# Patient Record
Sex: Male | Born: 1980 | Race: Black or African American | Hispanic: No | Marital: Married | Smoking: Former smoker
Health system: Southern US, Community
[De-identification: ages and names within clinical notes are randomized; demographics above are authoritative.]

## PROBLEM LIST (undated history)

## (undated) DIAGNOSIS — B1910 Unspecified viral hepatitis B without hepatic coma: Secondary | ICD-10-CM

## (undated) DIAGNOSIS — K759 Inflammatory liver disease, unspecified: Secondary | ICD-10-CM

## (undated) DIAGNOSIS — C8590 Non-Hodgkin lymphoma, unspecified, unspecified site: Secondary | ICD-10-CM

## (undated) DIAGNOSIS — R7989 Other specified abnormal findings of blood chemistry: Secondary | ICD-10-CM

## (undated) DIAGNOSIS — K7689 Other specified diseases of liver: Secondary | ICD-10-CM

## (undated) DIAGNOSIS — R945 Abnormal results of liver function studies: Secondary | ICD-10-CM

## (undated) HISTORY — DX: Inflammatory liver disease, unspecified: K75.9

## (undated) HISTORY — DX: Unspecified viral hepatitis B without hepatic coma: B19.10

## (undated) HISTORY — PX: PORT-A-CATH REMOVAL: SHX5289

## (undated) HISTORY — DX: Other specified diseases of liver: K76.89

## (undated) HISTORY — DX: Other specified abnormal findings of blood chemistry: R79.89

## (undated) HISTORY — PX: PORTACATH PLACEMENT: SHX2246

## (undated) HISTORY — DX: Abnormal results of liver function studies: R94.5

---
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
## 1999-08-09 ENCOUNTER — Emergency Department (HOSPITAL_COMMUNITY): Admission: EM | Admit: 1999-08-09 | Discharge: 1999-08-09 | Payer: Self-pay | Admitting: Emergency Medicine

## 2000-05-05 ENCOUNTER — Encounter: Payer: Self-pay | Admitting: Emergency Medicine

## 2000-05-05 ENCOUNTER — Emergency Department (HOSPITAL_COMMUNITY): Admission: EM | Admit: 2000-05-05 | Discharge: 2000-05-05 | Payer: Self-pay | Admitting: Emergency Medicine

## 2002-05-01 ENCOUNTER — Ambulatory Visit (HOSPITAL_COMMUNITY): Admission: RE | Admit: 2002-05-01 | Discharge: 2002-05-01 | Payer: Self-pay | Admitting: *Deleted

## 2002-05-01 ENCOUNTER — Encounter: Payer: Self-pay | Admitting: *Deleted

## 2006-05-07 ENCOUNTER — Emergency Department (HOSPITAL_COMMUNITY): Admission: EM | Admit: 2006-05-07 | Discharge: 2006-05-07 | Payer: Self-pay | Admitting: Emergency Medicine

## 2006-12-17 ENCOUNTER — Emergency Department (HOSPITAL_COMMUNITY): Admission: EM | Admit: 2006-12-17 | Discharge: 2006-12-17 | Payer: Self-pay | Admitting: Emergency Medicine

## 2007-12-13 ENCOUNTER — Emergency Department (HOSPITAL_COMMUNITY): Admission: EM | Admit: 2007-12-13 | Discharge: 2007-12-13 | Payer: Self-pay | Admitting: *Deleted

## 2009-03-12 ENCOUNTER — Emergency Department (HOSPITAL_COMMUNITY): Admission: EM | Admit: 2009-03-12 | Discharge: 2009-03-12 | Payer: Self-pay | Admitting: Family Medicine

## 2009-08-04 ENCOUNTER — Ambulatory Visit (HOSPITAL_COMMUNITY): Admission: RE | Admit: 2009-08-04 | Discharge: 2009-08-04 | Payer: Self-pay | Admitting: Cardiology

## 2009-08-04 ENCOUNTER — Encounter (INDEPENDENT_AMBULATORY_CARE_PROVIDER_SITE_OTHER): Payer: Self-pay | Admitting: Cardiology

## 2010-01-21 ENCOUNTER — Emergency Department (HOSPITAL_COMMUNITY): Admission: EM | Admit: 2010-01-21 | Discharge: 2010-01-21 | Payer: Self-pay | Admitting: Emergency Medicine

## 2010-08-25 LAB — POCT URINALYSIS DIP (DEVICE)
Bilirubin Urine: NEGATIVE
Glucose, UA: NEGATIVE mg/dL
Hgb urine dipstick: NEGATIVE
Nitrite: NEGATIVE
Protein, ur: NEGATIVE mg/dL
Specific Gravity, Urine: 1.02 (ref 1.005–1.030)
Urobilinogen, UA: 1 mg/dL (ref 0.0–1.0)
pH: 7 (ref 5.0–8.0)

## 2015-12-09 ENCOUNTER — Observation Stay (HOSPITAL_COMMUNITY)
Admission: EM | Admit: 2015-12-09 | Discharge: 2015-12-12 | Disposition: A | Discharge status: Home / Self Care | Payer: Managed Care, Other (non HMO) | Attending: Internal Medicine | Admitting: Internal Medicine

## 2015-12-09 ENCOUNTER — Encounter (HOSPITAL_COMMUNITY): Payer: Self-pay | Admitting: Emergency Medicine

## 2015-12-09 ENCOUNTER — Emergency Department (HOSPITAL_COMMUNITY): Payer: Managed Care, Other (non HMO)

## 2015-12-09 DIAGNOSIS — R1013 Epigastric pain: Secondary | ICD-10-CM | POA: Diagnosis not present

## 2015-12-09 DIAGNOSIS — R945 Abnormal results of liver function studies: Secondary | ICD-10-CM

## 2015-12-09 DIAGNOSIS — E878 Other disorders of electrolyte and fluid balance, not elsewhere classified: Secondary | ICD-10-CM | POA: Insufficient documentation

## 2015-12-09 DIAGNOSIS — J039 Acute tonsillitis, unspecified: Secondary | ICD-10-CM | POA: Insufficient documentation

## 2015-12-09 DIAGNOSIS — R509 Fever, unspecified: Secondary | ICD-10-CM | POA: Diagnosis present

## 2015-12-09 DIAGNOSIS — R1010 Upper abdominal pain, unspecified: Secondary | ICD-10-CM | POA: Diagnosis not present

## 2015-12-09 DIAGNOSIS — E871 Hypo-osmolality and hyponatremia: Secondary | ICD-10-CM | POA: Diagnosis not present

## 2015-12-09 DIAGNOSIS — C8590 Non-Hodgkin lymphoma, unspecified, unspecified site: Secondary | ICD-10-CM | POA: Insufficient documentation

## 2015-12-09 DIAGNOSIS — R7989 Other specified abnormal findings of blood chemistry: Secondary | ICD-10-CM | POA: Diagnosis present

## 2015-12-09 DIAGNOSIS — R109 Unspecified abdominal pain: Secondary | ICD-10-CM | POA: Diagnosis present

## 2015-12-09 HISTORY — DX: Non-Hodgkin lymphoma, unspecified, unspecified site: C85.90

## 2015-12-09 LAB — URINALYSIS, ROUTINE W REFLEX MICROSCOPIC
GLUCOSE, UA: NEGATIVE mg/dL
Hgb urine dipstick: NEGATIVE
LEUKOCYTES UA: NEGATIVE
NITRITE: NEGATIVE
PH: 7 (ref 5.0–8.0)
Protein, ur: NEGATIVE mg/dL
Specific Gravity, Urine: 1.022 (ref 1.005–1.030)

## 2015-12-09 LAB — COMPREHENSIVE METABOLIC PANEL
ALT: 718 U/L — ABNORMAL HIGH (ref 17–63)
ANION GAP: 8 (ref 5–15)
AST: 503 U/L — AB (ref 15–41)
Albumin: 3.8 g/dL (ref 3.5–5.0)
Alkaline Phosphatase: 82 U/L (ref 38–126)
BILIRUBIN TOTAL: 1.7 mg/dL — AB (ref 0.3–1.2)
BUN: 7 mg/dL (ref 6–20)
CHLORIDE: 98 mmol/L — AB (ref 101–111)
CO2: 26 mmol/L (ref 22–32)
Calcium: 9.1 mg/dL (ref 8.9–10.3)
Creatinine, Ser: 1.01 mg/dL (ref 0.61–1.24)
Glucose, Bld: 95 mg/dL (ref 65–99)
POTASSIUM: 4.3 mmol/L (ref 3.5–5.1)
Sodium: 132 mmol/L — ABNORMAL LOW (ref 135–145)
TOTAL PROTEIN: 8.2 g/dL — AB (ref 6.5–8.1)

## 2015-12-09 LAB — CBC
HCT: 42.1 % (ref 39.0–52.0)
HEMOGLOBIN: 14.6 g/dL (ref 13.0–17.0)
MCH: 28.7 pg (ref 26.0–34.0)
MCHC: 34.7 g/dL (ref 30.0–36.0)
MCV: 82.7 fL (ref 78.0–100.0)
Platelets: 170 10*3/uL (ref 150–400)
RBC: 5.09 MIL/uL (ref 4.22–5.81)
RDW: 13.5 % (ref 11.5–15.5)
WBC: 8.9 10*3/uL (ref 4.0–10.5)

## 2015-12-09 LAB — LIPASE, BLOOD: LIPASE: 23 U/L (ref 11–51)

## 2015-12-09 LAB — MONONUCLEOSIS SCREEN: MONO SCREEN: NEGATIVE

## 2015-12-09 LAB — RAPID STREP SCREEN (MED CTR MEBANE ONLY): Streptococcus, Group A Screen (Direct): NEGATIVE

## 2015-12-09 MED ORDER — IBUPROFEN 200 MG PO TABS
400.0000 mg | ORAL_TABLET | Freq: Once | ORAL | Status: AC
  Administered 2015-12-09: 400 mg via ORAL
  Filled 2015-12-09: qty 2

## 2015-12-09 MED ORDER — SODIUM CHLORIDE 0.9% FLUSH: 3.0000 mL | INTRAVENOUS | Status: DC | PRN

## 2015-12-09 MED ORDER — SODIUM CHLORIDE 0.9% FLUSH
3.0000 mL | Freq: Two times a day (BID) | INTRAVENOUS | Status: DC
  Administered 2015-12-11: 3 mL via INTRAVENOUS

## 2015-12-09 MED ORDER — ACETAMINOPHEN 325 MG PO TABS
650.0000 mg | ORAL_TABLET | Freq: Four times a day (QID) | ORAL | Status: DC | PRN
Start: 2015-12-09 — End: 2015-12-12
  Filled 2015-12-09: qty 2

## 2015-12-09 MED ORDER — ACETAMINOPHEN 650 MG RE SUPP
650.0000 mg | Freq: Four times a day (QID) | RECTAL | Status: DC | PRN
Start: 2015-12-09 — End: 2015-12-12

## 2015-12-09 MED ORDER — SODIUM CHLORIDE 0.9 % IV SOLN: 250.0000 mL | INTRAVENOUS | Status: DC | PRN

## 2015-12-09 MED ORDER — ONDANSETRON HCL 4 MG PO TABS
4.0000 mg | ORAL_TABLET | Freq: Four times a day (QID) | ORAL | Status: DC | PRN
Start: 2015-12-09 — End: 2015-12-12

## 2015-12-09 MED ORDER — FAMOTIDINE IN NACL 20-0.9 MG/50ML-% IV SOLN
20.0000 mg | Freq: Two times a day (BID) | INTRAVENOUS | Status: DC
  Administered 2015-12-09 – 2015-12-11 (×5): 20 mg via INTRAVENOUS
  Filled 2015-12-09 (×5): qty 50

## 2015-12-09 MED ORDER — POLYETHYLENE GLYCOL 3350 17 G PO PACK: 17.0000 g | PACK | Freq: Every day | ORAL | Status: DC | PRN

## 2015-12-09 MED ORDER — SODIUM CHLORIDE 0.9 % IV BOLUS (SEPSIS)
1000.0000 mL | Freq: Once | INTRAVENOUS | Status: AC
  Administered 2015-12-09: 1000 mL via INTRAVENOUS

## 2015-12-09 MED ORDER — IOPAMIDOL (ISOVUE-300) INJECTION 61%
INTRAVENOUS | Status: AC
  Administered 2015-12-09: 100 mL
  Filled 2015-12-09: qty 100

## 2015-12-09 MED ORDER — ENOXAPARIN SODIUM 40 MG/0.4ML ~~LOC~~ SOLN
40.0000 mg | SUBCUTANEOUS | Status: DC
  Administered 2015-12-10 – 2015-12-12 (×3): 40 mg via SUBCUTANEOUS
  Filled 2015-12-09 (×3): qty 0.4

## 2015-12-09 MED ORDER — BISACODYL 5 MG PO TBEC: 5.0000 mg | DELAYED_RELEASE_TABLET | Freq: Every day | ORAL | Status: DC | PRN

## 2015-12-09 MED ORDER — HYDROCODONE-ACETAMINOPHEN 5-325 MG PO TABS
1.0000 | ORAL_TABLET | ORAL | Status: DC | PRN
  Administered 2015-12-10 – 2015-12-11 (×2): 2 via ORAL
  Filled 2015-12-09 (×3): qty 2

## 2015-12-09 MED ORDER — ONDANSETRON HCL 4 MG/2ML IJ SOLN: 4.0000 mg | Freq: Four times a day (QID) | INTRAMUSCULAR | Status: DC | PRN

## 2015-12-09 MED ORDER — HYDROMORPHONE HCL 1 MG/ML IJ SOLN: 1.0000 mg | INTRAMUSCULAR | Status: DC | PRN

## 2015-12-09 MED ORDER — SODIUM CHLORIDE 0.9 % IV SOLN
INTRAVENOUS | Status: AC
  Administered 2015-12-10: 05:00:00 via INTRAVENOUS

## 2015-12-09 MED ORDER — ONDANSETRON HCL 4 MG/2ML IJ SOLN
4.0000 mg | Freq: Once | INTRAMUSCULAR | Status: AC
Start: 2015-12-09 — End: 2015-12-09
  Administered 2015-12-09: 4 mg via INTRAVENOUS
  Filled 2015-12-09: qty 2

## 2015-12-09 MED ORDER — MORPHINE SULFATE (PF) 4 MG/ML IV SOLN
4.0000 mg | Freq: Once | INTRAVENOUS | Status: AC
  Administered 2015-12-09: 4 mg via INTRAVENOUS
  Filled 2015-12-09: qty 1

## 2015-12-09 NOTE — ED Notes (Signed)
Attempted report x1. 

## 2015-12-09 NOTE — H&P (Signed)
History and Physical    [name] [id number] DOB: 1980/09/30 DOA: 12/09/2015  PCP: No PCP Per Patient   Patient coming from: Home   Chief Complaint: Epigastric pain, fever  HPI: Maurice Lowe is a 35 y.o. 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 Patient reports being in his usual state of health until several days ago when he noted the development of a sore throat and subjective fevers. Symptoms persisted and yesterday were accompanied by severe epigastric abdominal pain. Patient has never experienced similar symptoms previously, takes no daily medications, denies sick contacts or long distance travel, and denies any significant alcohol use or use of illicit substances. Patient describes his pain as severe, nonradiating, localized to the epigastrium, exacerbated by palpation, and with no alleviating factors identified. Patient denies any family history of hepatitis, denies IV drug, or high-risk sexual behaviors. His history is notable for a remote blood transfusion sometime around 1990. Patient denies any chest pain, palpitations, cough, or dyspnea.  ED Course: Upon arrival to the ED, patient is found to be febrile to 39.2 C, saturating well on room air, and with vitals otherwise stable. CMP features a mild hyponatremia and hypochloremia, elevation in AST and ALT to 503 and 718, respectively, and total bilirubin of 1.7. CBC is unremarkable and lipase is within normal limits at 23. Rapid mono and strep testing was performed in the emergency department and negative. Ultrasound of the abdomen was performed and unremarkable. Contrast-enhanced CT of the abdomen and pelvis was also obtained in the ED and there are no acute or significant findings identified. Patient was given a bolus of 1 L normal saline, and treated symptomatically with  Advil, morphine, Pepcid, and Zofran. He remained hemodynamically stable in the ED and will be observed on the medical-surgical unit for ongoing evaluation and management of fever with abdominal pain and significant elevation in LFTs.  Review of Systems:  All other systems reviewed and apart from HPI, are negative.  Past Medical History  Diagnosis Date  . Non Hodgkin's lymphoma ([place])     History reviewed. No pertinent past surgical history.   reports that he has been smoking.  He does not have any smokeless tobacco history on file. He reports that he drinks about 1.8 oz of alcohol per week. His drug history is not on file.  Allergies  Allergen Reactions  . Phenergan [Promethazine] Other (See Comments)    Hyperactivity and agitation    Family History  Problem Relation Age of Onset  . Hepatitis Neg Hx      Prior to Admission medications   Not on File    Physical Exam: Filed Vitals:   12/09/15 2156 12/09/15 2215 12/09/15 2230 12/09/15 2310  BP: 109/71 102/64 112/72 110/61  Pulse: 85 67 71 55  Temp:    98.4 F (36.9 C)  TempSrc:    Oral  Resp: 20 18 18 18   Height:    5\' 6"  (1.676 m)  Weight:    56.246 kg (124 lb)  SpO2: 99% 100% 100% 96%      Constitutional: NAD, calm, comfortable Eyes: PERTLA, lids and conjunctivae normal, slightly icteric sclerae ENMT: Mucous membranes are moist. Posterior pharynx clear of any exudate or lesions.   Neck: normal, supple, no masses, no thyromegaly Respiratory: clear to auscultation bilaterally, no wheezing, no crackles. Normal respiratory effort.   Cardiovascular: S1 &  S2 heard, regular rate and rhythm, no significant murmurs. No extremity edema. 2+ pedal pulses. No carotid bruits. No significant JVD. Abdomen: No distension, tender in epigastrium only, no rebound pain or guarding, no masses palpated. Bowel sounds normal.  Musculoskeletal: no clubbing / cyanosis. No joint deformity upper and lower extremities. Normal muscle tone.    Skin: no significant rashes, lesions, ulcers. Warm, dry, well-perfused. Neurologic: CN 2-12 grossly intact. Sensation intact, DTR normal. Strength 5/5 in all 4 limbs.  Psychiatric: Normal judgment and insight. Alert and oriented x 3. Normal mood and affect.     Labs on Admission: I have personally reviewed following labs and imaging studies  CBC:  Recent Labs Lab 12/09/15 1350  WBC 8.9  HGB 14.6  HCT 42.1  MCV 82.7  PLT 123XX123   Basic Metabolic Panel:  Recent Labs Lab 12/09/15 1350  NA 132*  K 4.3  CL 98*  CO2 26  GLUCOSE 95  BUN 7  CREATININE 1.01  CALCIUM 9.1   GFR: Estimated Creatinine Clearance: 80.4 mL/min (by C-G formula based on Cr of 1.01). Liver Function Tests:  Recent Labs Lab 12/09/15 1350  AST 503*  ALT 718*  ALKPHOS 82  BILITOT 1.7*  PROT 8.2*  ALBUMIN 3.8    Recent Labs Lab 12/09/15 1350  LIPASE 23   No results for input(s): AMMONIA in the last 168 hours. Coagulation Profile: No results for input(s): INR, PROTIME in the last 168 hours. Cardiac Enzymes: No results for input(s): CKTOTAL, CKMB, CKMBINDEX, TROPONINI in the last 168 hours. BNP (last 3 results) No results for input(s): PROBNP in the last 8760 hours. HbA1C: No results for input(s): HGBA1C in the last 72 hours. CBG: No results for input(s): GLUCAP in the last 168 hours. Lipid Profile: No results for input(s): CHOL, HDL, LDLCALC, TRIG, CHOLHDL, LDLDIRECT in the last 72 hours. Thyroid Function Tests: No results for input(s): TSH, T4TOTAL, FREET4, T3FREE, THYROIDAB in the last 72 hours. Anemia Panel: No results for input(s): VITAMINB12, FOLATE, FERRITIN, TIBC, IRON, RETICCTPCT in the last 72 hours. Urine analysis:    Component Value Date/Time   COLORURINE AMBER* 12/09/2015 [place] 12/09/2015 1629   LABSPEC 1.022 12/09/2015 1629   PHURINE 7.0 12/09/2015 1629   GLUCOSEU NEGATIVE 12/09/2015 1629   HGBUR NEGATIVE 12/09/2015 1629   BILIRUBINUR SMALL*  12/09/2015 1629   KETONESUR >80* 12/09/2015 1629   PROTEINUR NEGATIVE 12/09/2015 1629   UROBILINOGEN 1.0 03/12/2009 1742   NITRITE NEGATIVE 12/09/2015 1629   LEUKOCYTESUR NEGATIVE 12/09/2015 1629   Sepsis Labs: @LABRCNTIP (procalcitonin:4,lacticidven:4) ) Recent Results (from the past 240 hour(s))  Rapid strep screen     Status: None   Collection Time: 12/09/15  1:51 PM  Result Value Ref Range Status   Streptococcus, Group A Screen (Direct) NEGATIVE NEGATIVE Final    Comment: (NOTE) A Rapid Antigen test may result negative if the antigen level in the sample is below the detection level of this test. The FDA has not cleared this test as a stand-alone test therefore the rapid antigen negative result has reflexed to a Group A Strep culture.      Radiological Exams on Admission: US Abdomen Complete  12/09/2015  CLINICAL DATA:  Elevated LFTs.  Abdominal pain. EXAM: ABDOMEN ULTRASOUND COMPLETE COMPARISON:  None. FINDINGS: Gallbladder: No gallstones or wall thickening visualized. No sonographic Murphy sign noted by sonographer. Common bile duct: Diameter: 2 mm Liver: The left hepatic lobe is mildly heterogeneous. No focal masses or abnormalities seen in the liver.  IVC: No abnormality visualized. Pancreas: Visualized portion unremarkable. Spleen: Size and appearance within normal limits. Right Kidney: Length: 11.1 cm. Echogenicity within normal limits. No mass or hydronephrosis visualized. Left Kidney: Length: 11 cm. Echogenicity within normal limits. No mass or hydronephrosis visualized. Abdominal aorta: No aneurysm visualized. Other findings: None. IMPRESSION: 1. No cause for the patient's symptoms identified. No acute abnormalities. Electronically Signed   By: Dorise Bullion III M.D   On: 12/09/2015 18:46   Ct Abdomen Pelvis W Contrast  12/09/2015  CLINICAL DATA:  Abdominal pain. Non-Hodgkin's lymphoma and hepatitis-B. EXAM: CT ABDOMEN AND PELVIS WITH CONTRAST TECHNIQUE: Multidetector CT  imaging of the abdomen and pelvis was performed using the standard protocol following bolus administration of intravenous contrast. CONTRAST:  145mL ISOVUE-300 IOPAMIDOL (ISOVUE-300) INJECTION 61% COMPARISON:  None. FINDINGS: Lower chest:  No acute findings. Hepatobiliary: Several hepatic cysts noted, however no liver masses are identified. Gallbladder is unremarkable. Pancreas: No mass, inflammatory changes, or other significant abnormality. Spleen: Within normal limits in size and appearance. Adrenals/Urinary Tract: No masses identified. No evidence of hydronephrosis. Stomach/Bowel: No evidence of obstruction, inflammatory process, or abnormal fluid collections. Normal appendix visualized. Vascular/Lymphatic: No pathologically enlarged lymph nodes. No evidence of abdominal aortic aneurysm. Reproductive: No mass or other significant abnormality. Other: None. Musculoskeletal:  No suspicious bone lesions identified. IMPRESSION: No acute findings or other significant abnormality identified. Electronically Signed   By: Earle Gell M.D.   On: 12/09/2015 20:46    EKG: Not performed, will obtain as appropriate.   Assessment/Plan  1. Abdominal pain, fever, elevated transaminases  - ASt 503; ALT 718; T bili 1.7; temp 39.2 C - Suspect these are related, suggesting an infectious etiology, though no leukocytosis present and no foci identified on advanced imaging  - Denies significant alcohol and denies use of any medications, including OTC's; denies IVDA or contacts with known hepatitis - Notably, he did have a blood transfusion prior to 1992 during his treatment for NHL  - Viral hepatitis panel is ordered and pending; if negative, may need to consider autoimmune etiologies, etc.  - Supportive care with gentle IVF hydration, antiemetics, analgesia, antipyretics prn   - Repeat CMP in am   2. Hyponatremia, hypochloremia  - Serum sodium 132, chloride 98 - Suspected secondary to dehydration  - Anticipate  resolution with IVF hydration  - Repeat CMP ordered for am     DVT prophylaxis: sq Lovenox  Code Status: Full  Family Communication: Discussed with patient  Disposition Plan: Observe on med-surg  Consults called: None  Admission status: Observation    Vianne Bulls, MD [facility] Pager [phone]  If 7PM-7AM, please contact night-coverage [email or web address] Password [email or web address]  12/10/2015, 12:02 AM

## 2015-12-09 NOTE — ED Notes (Signed)
abd pain since yesterday sore throat and fever for a few days

## 2015-12-09 NOTE — ED Notes (Signed)
Pt to ultrasound at this time.

## 2015-12-09 NOTE — ED Provider Notes (Signed)
CSN: [id number]     Arrival date & time 12/09/15  1325 History   First MD Initiated Contact with Patient 12/09/15 1619     Chief Complaint  Patient presents with  . Abdominal Pain     (Consider location/radiation/quality/duration/timing/severity/associated sxs/prior Treatment) HPI Comments: Patient with a prior history of non-Hodgkin's lymphoma presents with fever abdominal pain and sore throat. His non-Hodgkin's lymphoma was treated from ages [age]. He hasn't had any problems since that time. He states for about the last 5 days he's had low-grade fevers and sore throat. He denies any nasal congestion or other URI symptoms. No cough or chest congestion. Yesterday started having some stabbing pains in his epigastrium. He states it comes and goes but is fairly frequent. He's had some nausea but no vomiting. No diarrhea. No rashes. No urinary symptoms. He was seen in urgent care and sent here for further evaluation.  Patient is a 35 y.o. male presenting with abdominal pain.  Abdominal Pain Associated symptoms: fatigue, fever, nausea and sore throat   Associated symptoms: no chest pain, no chills, no cough, no diarrhea, no hematuria, no shortness of breath and no vomiting     Past Medical History  Diagnosis Date  . Non Hodgkin's lymphoma ([place])    History reviewed. No pertinent past surgical history. No family history on file. Social History  Substance Use Topics  . Smoking status: Current Some Day Smoker  . Smokeless tobacco: None  . Alcohol Use: None    Review of Systems  Constitutional: Positive for fever and fatigue. Negative for chills and diaphoresis.  HENT: Positive for sore throat. Negative for congestion, rhinorrhea and sneezing.   Eyes: Negative.   Respiratory: Negative for cough, chest tightness and shortness of breath.   Cardiovascular: Negative for chest pain and leg swelling.  Gastrointestinal: Positive for nausea and abdominal pain. Negative for vomiting, diarrhea and  blood in stool.  Genitourinary: Negative for frequency, hematuria, flank pain and difficulty urinating.  Musculoskeletal: Negative for back pain and arthralgias.  Skin: Negative for rash.  Neurological: Negative for dizziness, speech difficulty, weakness, numbness and headaches.      Allergies  Phenergan  Home Medications   Prior to Admission medications   Not on File   BP 109/71 mmHg  Pulse 85  Temp(Src) 99.4 F (37.4 C) (Oral)  Resp 20  SpO2 99% Physical Exam  Constitutional: He is oriented to person, place, and time. He appears well-developed and well-nourished.  HENT:  Head: Normocephalic and atraumatic.  Eyes: Pupils are equal, round, and reactive to light.  Neck: Normal range of motion. Neck supple.  Cardiovascular: Normal rate, regular rhythm and normal heart sounds.   Pulmonary/Chest: Effort normal and breath sounds normal. No respiratory distress. He has no wheezes. He has no rales. He exhibits no tenderness.  Abdominal: Soft. Bowel sounds are normal. There is tenderness (Tenderness to the epigastrium). There is no rebound and no guarding.  Musculoskeletal: Normal range of motion. He exhibits no edema.  Lymphadenopathy:    He has no cervical adenopathy.  Neurological: He is alert and oriented to person, place, and time.  Skin: Skin is warm and dry. No rash noted.  Psychiatric: He has a normal mood and affect.    ED Course  Procedures (including critical care time) Labs Review Results for orders placed or performed during the hospital encounter of 12/09/15  Rapid strep screen  Result Value Ref Range   Streptococcus, Group A Screen (Direct) NEGATIVE NEGATIVE  Lipase, blood  Result  Value Ref Range   Lipase 23 11 - 51 U/L  Comprehensive metabolic panel  Result Value Ref Range   Sodium 132 (L) 135 - 145 mmol/L   Potassium 4.3 3.5 - 5.1 mmol/L   Chloride 98 (L) 101 - 111 mmol/L   CO2 26 22 - 32 mmol/L   Glucose, Bld 95 65 - 99 mg/dL   BUN 7 6 - 20 mg/dL    Creatinine, Ser 1.01 0.61 - 1.24 mg/dL   Calcium 9.1 8.9 - 10.3 mg/dL   Total Protein 8.2 (H) 6.5 - 8.1 g/dL   Albumin 3.8 3.5 - 5.0 g/dL   AST 503 (H) 15 - 41 U/L   ALT 718 (H) 17 - 63 U/L   Alkaline Phosphatase 82 38 - 126 U/L   Total Bilirubin 1.7 (H) 0.3 - 1.2 mg/dL   GFR calc non Af Amer >60 >60 mL/min   GFR calc Af Amer >60 >60 mL/min   Anion gap 8 5 - 15  CBC  Result Value Ref Range   WBC 8.9 4.0 - 10.5 K/uL   RBC 5.09 4.22 - 5.81 MIL/uL   Hemoglobin 14.6 13.0 - 17.0 g/dL   HCT 42.1 39.0 - 52.0 %   MCV 82.7 78.0 - 100.0 fL   MCH 28.7 26.0 - 34.0 pg   MCHC 34.7 30.0 - 36.0 g/dL   RDW 13.5 11.5 - 15.5 %   Platelets 170 150 - 400 K/uL  Urinalysis, Routine w reflex microscopic  Result Value Ref Range   Color, Urine AMBER (A) YELLOW   APPearance CLEAR CLEAR   Specific Gravity, Urine 1.022 1.005 - 1.030   pH 7.0 5.0 - 8.0   Glucose, UA NEGATIVE NEGATIVE mg/dL   Hgb urine dipstick NEGATIVE NEGATIVE   Bilirubin Urine SMALL (A) NEGATIVE   Ketones, ur >80 (A) NEGATIVE mg/dL   Protein, ur NEGATIVE NEGATIVE mg/dL   Nitrite NEGATIVE NEGATIVE   Leukocytes, UA NEGATIVE NEGATIVE  Mononucleosis screen  Result Value Ref Range   Mono Screen NEGATIVE NEGATIVE   US Abdomen Complete  12/09/2015  CLINICAL DATA:  Elevated LFTs.  Abdominal pain. EXAM: ABDOMEN ULTRASOUND COMPLETE COMPARISON:  None. FINDINGS: Gallbladder: No gallstones or wall thickening visualized. No sonographic Murphy sign noted by sonographer. Common bile duct: Diameter: 2 mm Liver: The left hepatic lobe is mildly heterogeneous. No focal masses or abnormalities seen in the liver. IVC: No abnormality visualized. Pancreas: Visualized portion unremarkable. Spleen: Size and appearance within normal limits. Right Kidney: Length: 11.1 cm. Echogenicity within normal limits. No mass or hydronephrosis visualized. Left Kidney: Length: 11 cm. Echogenicity within normal limits. No mass or hydronephrosis visualized. Abdominal aorta: No  aneurysm visualized. Other findings: None. IMPRESSION: 1. No cause for the patient's symptoms identified. No acute abnormalities. Electronically Signed   By: Dorise Bullion III M.D   On: 12/09/2015 18:46   Ct Abdomen Pelvis W Contrast  12/09/2015  CLINICAL DATA:  Abdominal pain. Non-Hodgkin's lymphoma and hepatitis-B. EXAM: CT ABDOMEN AND PELVIS WITH CONTRAST TECHNIQUE: Multidetector CT imaging of the abdomen and pelvis was performed using the standard protocol following bolus administration of intravenous contrast. CONTRAST:  141mL ISOVUE-300 IOPAMIDOL (ISOVUE-300) INJECTION 61% COMPARISON:  None. FINDINGS: Lower chest:  No acute findings. Hepatobiliary: Several hepatic cysts noted, however no liver masses are identified. Gallbladder is unremarkable. Pancreas: No mass, inflammatory changes, or other significant abnormality. Spleen: Within normal limits in size and appearance. Adrenals/Urinary Tract: No masses identified. No evidence of hydronephrosis. Stomach/Bowel: No evidence of obstruction,  inflammatory process, or abnormal fluid collections. Normal appendix visualized. Vascular/Lymphatic: No pathologically enlarged lymph nodes. No evidence of abdominal aortic aneurysm. Reproductive: No mass or other significant abnormality. Other: None. Musculoskeletal:  No suspicious bone lesions identified. IMPRESSION: No acute findings or other significant abnormality identified. Electronically Signed   By: Earle Gell M.D.   On: 12/09/2015 20:46      Imaging Review US Abdomen Complete  12/09/2015  CLINICAL DATA:  Elevated LFTs.  Abdominal pain. EXAM: ABDOMEN ULTRASOUND COMPLETE COMPARISON:  None. FINDINGS: Gallbladder: No gallstones or wall thickening visualized. No sonographic Murphy sign noted by sonographer. Common bile duct: Diameter: 2 mm Liver: The left hepatic lobe is mildly heterogeneous. No focal masses or abnormalities seen in the liver. IVC: No abnormality visualized. Pancreas: Visualized portion  unremarkable. Spleen: Size and appearance within normal limits. Right Kidney: Length: 11.1 cm. Echogenicity within normal limits. No mass or hydronephrosis visualized. Left Kidney: Length: 11 cm. Echogenicity within normal limits. No mass or hydronephrosis visualized. Abdominal aorta: No aneurysm visualized. Other findings: None. IMPRESSION: 1. No cause for the patient's symptoms identified. No acute abnormalities. Electronically Signed   By: Dorise Bullion III M.D   On: 12/09/2015 18:46   Ct Abdomen Pelvis W Contrast  12/09/2015  CLINICAL DATA:  Abdominal pain. Non-Hodgkin's lymphoma and hepatitis-B. EXAM: CT ABDOMEN AND PELVIS WITH CONTRAST TECHNIQUE: Multidetector CT imaging of the abdomen and pelvis was performed using the standard protocol following bolus administration of intravenous contrast. CONTRAST:  154mL ISOVUE-300 IOPAMIDOL (ISOVUE-300) INJECTION 61% COMPARISON:  None. FINDINGS: Lower chest:  No acute findings. Hepatobiliary: Several hepatic cysts noted, however no liver masses are identified. Gallbladder is unremarkable. Pancreas: No mass, inflammatory changes, or other significant abnormality. Spleen: Within normal limits in size and appearance. Adrenals/Urinary Tract: No masses identified. No evidence of hydronephrosis. Stomach/Bowel: No evidence of obstruction, inflammatory process, or abnormal fluid collections. Normal appendix visualized. Vascular/Lymphatic: No pathologically enlarged lymph nodes. No evidence of abdominal aortic aneurysm. Reproductive: No mass or other significant abnormality. Other: None. Musculoskeletal:  No suspicious bone lesions identified. IMPRESSION: No acute findings or other significant abnormality identified. Electronically Signed   By: Earle Gell M.D.   On: 12/09/2015 20:46   I have personally reviewed and evaluated these images and lab results as part of my medical decision-making.   EKG Interpretation None      MDM   Final diagnoses:  Epigastric pain   Elevated LFTs    Patient presents with fever, sore throat, epigastric pain and elevated transaminases. His pain has improved in the ED with medication. He doesn't have a specific pain in the right upper quadrant which we more concerning for cholangitis. Imaging studies of his gallbladder and liver are unremarkable. Given his prior history of lymphoma, I did do a CT scan of his abdomen and pelvis which was negative. However I feel given the elevated transaminases associated with a fever of 102, he should be admitted for observation. I spoke with Dr.Opyd who will admit the patient.    Malvin Johns, MD 12/09/15 2206

## 2015-12-10 ENCOUNTER — Observation Stay (HOSPITAL_COMMUNITY): Payer: Managed Care, Other (non HMO)

## 2015-12-10 DIAGNOSIS — R1011 Right upper quadrant pain: Secondary | ICD-10-CM | POA: Diagnosis not present

## 2015-12-10 DIAGNOSIS — R7989 Other specified abnormal findings of blood chemistry: Secondary | ICD-10-CM

## 2015-12-10 DIAGNOSIS — R509 Fever, unspecified: Secondary | ICD-10-CM | POA: Diagnosis not present

## 2015-12-10 DIAGNOSIS — R1013 Epigastric pain: Secondary | ICD-10-CM

## 2015-12-10 DIAGNOSIS — R945 Abnormal results of liver function studies: Secondary | ICD-10-CM

## 2015-12-10 DIAGNOSIS — E871 Hypo-osmolality and hyponatremia: Secondary | ICD-10-CM | POA: Diagnosis not present

## 2015-12-10 LAB — PROTIME-INR
INR: 1.19 (ref 0.00–1.49)
PROTHROMBIN TIME: 15.3 s — AB (ref 11.6–15.2)

## 2015-12-10 LAB — COMPREHENSIVE METABOLIC PANEL
ALBUMIN: 3.2 g/dL — AB (ref 3.5–5.0)
ALK PHOS: 71 U/L (ref 38–126)
ALT: 571 U/L — AB (ref 17–63)
AST: 357 U/L — AB (ref 15–41)
Anion gap: 6 (ref 5–15)
BILIRUBIN TOTAL: 1.5 mg/dL — AB (ref 0.3–1.2)
BUN: 7 mg/dL (ref 6–20)
CALCIUM: 8.3 mg/dL — AB (ref 8.9–10.3)
CO2: 26 mmol/L (ref 22–32)
CREATININE: 0.97 mg/dL (ref 0.61–1.24)
Chloride: 102 mmol/L (ref 101–111)
GFR calc Af Amer: >60 mL/min (ref >60)
GFR calc non Af Amer: >60 mL/min (ref >60)
GLUCOSE: 111 mg/dL — AB (ref 65–99)
Potassium: 3.9 mmol/L (ref 3.5–5.1)
Sodium: 134 mmol/L — ABNORMAL LOW (ref 135–145)
TOTAL PROTEIN: 6.5 g/dL (ref 6.5–8.1)

## 2015-12-10 LAB — RESPIRATORY PANEL BY PCR
ADENOVIRUS-RVPPCR: NOT DETECTED
Bordetella pertussis: NOT DETECTED
CHLAMYDOPHILA PNEUMONIAE-RVPPCR: NOT DETECTED
CORONAVIRUS 229E-RVPPCR: NOT DETECTED
CORONAVIRUS NL63-RVPPCR: NOT DETECTED
CORONAVIRUS OC43-RVPPCR: NOT DETECTED
Coronavirus HKU1: NOT DETECTED
INFLUENZA A H1-RVPPCR: NOT DETECTED
INFLUENZA A-RVPPCR: NOT DETECTED
Influenza A H1 2009: NOT DETECTED
Influenza A H3: NOT DETECTED
Influenza B: NOT DETECTED
Metapneumovirus: NOT DETECTED
Mycoplasma pneumoniae: NOT DETECTED
PARAINFLUENZA VIRUS 1-RVPPCR: NOT DETECTED
PARAINFLUENZA VIRUS 3-RVPPCR: NOT DETECTED
PARAINFLUENZA VIRUS 4-RVPPCR: NOT DETECTED
Parainfluenza Virus 2: NOT DETECTED
RESPIRATORY SYNCYTIAL VIRUS-RVPPCR: NOT DETECTED
RHINOVIRUS / ENTEROVIRUS - RVPPCR: NOT DETECTED

## 2015-12-10 LAB — CBC
HCT: 39.3 % (ref 39.0–52.0)
Hemoglobin: 13.5 g/dL (ref 13.0–17.0)
MCH: 28.7 pg (ref 26.0–34.0)
MCHC: 34.4 g/dL (ref 30.0–36.0)
MCV: 83.4 fL (ref 78.0–100.0)
Platelets: 156 10*3/uL (ref 150–400)
RBC: 4.71 MIL/uL (ref 4.22–5.81)
RDW: 13.7 % (ref 11.5–15.5)
WBC: 8.7 10*3/uL (ref 4.0–10.5)

## 2015-12-10 LAB — IRON AND TIBC
Iron: 22 ug/dL — ABNORMAL LOW (ref 45–182)
Saturation Ratios: 11 % — ABNORMAL LOW (ref 17.9–39.5)
TIBC: 207 ug/dL — ABNORMAL LOW (ref 250–450)
UIBC: 185 ug/dL

## 2015-12-10 LAB — GLUCOSE, CAPILLARY
GLUCOSE-CAPILLARY: 81 mg/dL (ref 65–99)
Glucose-Capillary: 116 mg/dL — ABNORMAL HIGH (ref 65–99)

## 2015-12-10 LAB — LIPASE, BLOOD: LIPASE: 38 U/L (ref 11–51)

## 2015-12-10 LAB — PROCALCITONIN: Procalcitonin: 1.86 ng/mL

## 2015-12-10 LAB — FERRITIN: FERRITIN: 1917 ng/mL — AB (ref 24–336)

## 2015-12-10 LAB — LACTIC ACID, PLASMA: Lactic Acid, Venous: <0.3 mmol/L — ABNORMAL LOW (ref 0.5–1.9)

## 2015-12-10 MED ORDER — SODIUM CHLORIDE 0.9 % IV SOLN
INTRAVENOUS | Status: DC
  Administered 2015-12-11 – 2015-12-12 (×2): via INTRAVENOUS

## 2015-12-10 NOTE — Progress Notes (Signed)
[facility] PROGRESS NOTE  Maurice Lowe [id number] DOB: 1980/12/14 DOA: 12/09/2015   PCP: No PCP Per Patient     Assessment/Plan: Principal Problem:   Abdominal pain Active Problems:   LFT elevation   Fever, unspecified   Hyponatremia   Elevated LFTs   Epigastric pain   35 y.o. 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 Patient was also found to have elevation in AST and ALT to 503 and 718.  Assessment and plan  1. Abdominal pain, fever, elevated transaminases improving - ASt 503; ALT 718; T bili 1.7; temp 39.2 C, AST/ALT 357/571  - Suspect these are related, suggesting an infectious etiology, though no leukocytosis present and no foci identified onCT abdomen pelvis, Differential diagnosis includes mono/mycoplasma pneumonia We will order viral  Serologies for EBV, CMV, mycoplasma pneumoniae Chest x-ray to rule out pneumonia Follow hepatitis panel - Denies significant alcohol and denies use of any medications, including OTC's; denies IVDA or contacts with known hepatitis - Notably, he did have a blood transfusion prior to 1992 during his treatment for NHL  - Supportive care with gentle IVF hydration, antiemetics, analgesia, antipyretics prn  - Repeat CMP in am  Consider infectious disease consultation once serologies result  2.Hypovolemic hyponatremia improving with IV fluids - Suspected secondary to dehydration  Continue IV fluids - Repeat CMP ordered for am     DVT prophylaxsis Lovenox  Code Status:  Full code     Family Communication: Discussed in detail with the patient, all imaging results, lab results explained to the patient   Disposition Plan:   Anticipate discharge in one to 2 days      Consultants: None  Procedures: None  Antibiotics: Anti-infectives    None          HPI/Subjective: Patient denies any chest pain, shortness of breath, still has a sore throat, no significant abdominal pain, nausea vomiting or diarrhea   Objective: Filed Vitals:   12/09/15 2215 12/09/15 2230 12/09/15 2310 12/10/15 0525  BP: 102/64 112/72 110/61 112/62  Pulse: 67 71 55 65  Temp:   98.4 F (36.9 C) 98.7 F (37.1 C)  TempSrc:   Oral Oral  Resp: 18 18 18 18   Height:   5\' 6"  (1.676 m)   Weight:   56.246 kg (124 lb) 56.246 kg (124 lb)  SpO2: 100% 100% 96% 100%    Intake/Output Summary (Last 24 hours) at 12/10/15 1228 Last data filed at 12/10/15 0600  Gross per 24 hour  Intake   1980 ml  Output      0 ml  Net   1980 ml    Exam:  Examination:  General exam: Appears calm and comfortable  Respiratory system: Clear to auscultation. Respiratory effort normal. Cardiovascular system: S1 & S2 heard, RRR. No JVD, murmurs, rubs, gallops or clicks. No pedal edema. Gastrointestinal system: Abdomen is nondistended, soft and nontender. No organomegaly or masses felt. Normal bowel sounds heard. Central nervous system: Alert and oriented. No focal neurological deficits. Extremities: Symmetric 5 x 5 power. Skin: No rashes, lesions or ulcers Psychiatry: Judgement and insight appear normal. Mood & affect appropriate.     Data Reviewed: I have personally reviewed following labs and imaging studies  Micro Results Recent Results (from the past 240 hour(s))  Rapid strep screen     Status: None  Collection Time: 12/09/15  1:51 PM  Result Value Ref Range Status   Streptococcus, Group A Screen (Direct) NEGATIVE NEGATIVE Final    Comment: (NOTE) A Rapid Antigen test may result negative if the antigen level in the sample is below the detection level of this test. The FDA has not cleared this test as a stand-alone test therefore the rapid antigen negative result has reflexed to a Group A Strep culture.     Radiology Reports US Abdomen Complete  12/09/2015   CLINICAL DATA:  Elevated LFTs.  Abdominal pain. EXAM: ABDOMEN ULTRASOUND COMPLETE COMPARISON:  None. FINDINGS: Gallbladder: No gallstones or wall thickening visualized. No sonographic Murphy sign noted by sonographer. Common bile duct: Diameter: 2 mm Liver: The left hepatic lobe is mildly heterogeneous. No focal masses or abnormalities seen in the liver. IVC: No abnormality visualized. Pancreas: Visualized portion unremarkable. Spleen: Size and appearance within normal limits. Right Kidney: Length: 11.1 cm. Echogenicity within normal limits. No mass or hydronephrosis visualized. Left Kidney: Length: 11 cm. Echogenicity within normal limits. No mass or hydronephrosis visualized. Abdominal aorta: No aneurysm visualized. Other findings: None. IMPRESSION: 1. No cause for the patient's symptoms identified. No acute abnormalities. Electronically Signed   By: Dorise Bullion III M.D   On: 12/09/2015 18:46   Ct Abdomen Pelvis W Contrast  12/09/2015  CLINICAL DATA:  Abdominal pain. Non-Hodgkin's lymphoma and hepatitis-B. EXAM: CT ABDOMEN AND PELVIS WITH CONTRAST TECHNIQUE: Multidetector CT imaging of the abdomen and pelvis was performed using the standard protocol following bolus administration of intravenous contrast. CONTRAST:  115mL ISOVUE-300 IOPAMIDOL (ISOVUE-300) INJECTION 61% COMPARISON:  None. FINDINGS: Lower chest:  No acute findings. Hepatobiliary: Several hepatic cysts noted, however no liver masses are identified. Gallbladder is unremarkable. Pancreas: No mass, inflammatory changes, or other significant abnormality. Spleen: Within normal limits in size and appearance. Adrenals/Urinary Tract: No masses identified. No evidence of hydronephrosis. Stomach/Bowel: No evidence of obstruction, inflammatory process, or abnormal fluid collections. Normal appendix visualized. Vascular/Lymphatic: No pathologically enlarged lymph nodes. No evidence of abdominal aortic aneurysm. Reproductive: No mass or other significant  abnormality. Other: None. Musculoskeletal:  No suspicious bone lesions identified. IMPRESSION: No acute findings or other significant abnormality identified. Electronically Signed   By: Earle Gell M.D.   On: 12/09/2015 20:46     CBC  Recent Labs Lab 12/09/15 1350 12/10/15 0020  WBC 8.9 8.7  HGB 14.6 13.5  HCT 42.1 39.3  PLT 170 156  MCV 82.7 83.4  MCH 28.7 28.7  MCHC 34.7 34.4  RDW 13.5 13.7    Chemistries   Recent Labs Lab 12/09/15 1350 12/10/15 0020  NA 132* 134*  K 4.3 3.9  CL 98* 102  CO2 26 26  GLUCOSE 95 111*  BUN 7 7  CREATININE 1.01 0.97  CALCIUM 9.1 8.3*  AST 503* 357*  ALT 718* 571*  ALKPHOS 82 71  BILITOT 1.7* 1.5*   ------------------------------------------------------------------------------------------------------------------ estimated creatinine clearance is 83.7 mL/min (by C-G formula based on Cr of 0.97). ------------------------------------------------------------------------------------------------------------------ No results for input(s): HGBA1C in the last 72 hours. ------------------------------------------------------------------------------------------------------------------ No results for input(s): CHOL, HDL, LDLCALC, TRIG, CHOLHDL, LDLDIRECT in the last 72 hours. ------------------------------------------------------------------------------------------------------------------ No results for input(s): TSH, T4TOTAL, T3FREE, THYROIDAB in the last 72 hours.  Invalid input(s): FREET3 ------------------------------------------------------------------------------------------------------------------  Recent Labs  12/10/15 0020  FERRITIN 1917*  TIBC 207*  IRON 22*    Coagulation profile  Recent Labs Lab 12/10/15 0020  INR 1.19    No results for input(s): DDIMER in the last 72 hours.  Cardiac Enzymes No results for input(s): CKMB, TROPONINI, MYOGLOBIN in the last 168 hours.  Invalid input(s):  CK ------------------------------------------------------------------------------------------------------------------ Invalid input(s): POCBNP   CBG:  Recent Labs Lab 12/10/15 0759 12/10/15 1132  GLUCAP 81 116*       Studies: US Abdomen Complete  12/09/2015  CLINICAL DATA:  Elevated LFTs.  Abdominal pain. EXAM: ABDOMEN ULTRASOUND COMPLETE COMPARISON:  None. FINDINGS: Gallbladder: No gallstones or wall thickening visualized. No sonographic Murphy sign noted by sonographer. Common bile duct: Diameter: 2 mm Liver: The left hepatic lobe is mildly heterogeneous. No focal masses or abnormalities seen in the liver. IVC: No abnormality visualized. Pancreas: Visualized portion unremarkable. Spleen: Size and appearance within normal limits. Right Kidney: Length: 11.1 cm. Echogenicity within normal limits. No mass or hydronephrosis visualized. Left Kidney: Length: 11 cm. Echogenicity within normal limits. No mass or hydronephrosis visualized. Abdominal aorta: No aneurysm visualized. Other findings: None. IMPRESSION: 1. No cause for the patient's symptoms identified. No acute abnormalities. Electronically Signed   By: Dorise Bullion III M.D   On: 12/09/2015 18:46   Ct Abdomen Pelvis W Contrast  12/09/2015  CLINICAL DATA:  Abdominal pain. Non-Hodgkin's lymphoma and hepatitis-B. EXAM: CT ABDOMEN AND PELVIS WITH CONTRAST TECHNIQUE: Multidetector CT imaging of the abdomen and pelvis was performed using the standard protocol following bolus administration of intravenous contrast. CONTRAST:  150mL ISOVUE-300 IOPAMIDOL (ISOVUE-300) INJECTION 61% COMPARISON:  None. FINDINGS: Lower chest:  No acute findings. Hepatobiliary: Several hepatic cysts noted, however no liver masses are identified. Gallbladder is unremarkable. Pancreas: No mass, inflammatory changes, or other significant abnormality. Spleen: Within normal limits in size and appearance. Adrenals/Urinary Tract: No masses identified. No evidence of  hydronephrosis. Stomach/Bowel: No evidence of obstruction, inflammatory process, or abnormal fluid collections. Normal appendix visualized. Vascular/Lymphatic: No pathologically enlarged lymph nodes. No evidence of abdominal aortic aneurysm. Reproductive: No mass or other significant abnormality. Other: None. Musculoskeletal:  No suspicious bone lesions identified. IMPRESSION: No acute findings or other significant abnormality identified. Electronically Signed   By: Earle Gell M.D.   On: 12/09/2015 20:46      No results found for: HGBA1C Lab Results  Component Value Date   CREATININE 0.97 12/10/2015       Scheduled Meds: . enoxaparin (LOVENOX) injection  40 mg Subcutaneous Q24H  . famotidine (PEPCID) IV  20 mg Intravenous Q12H  . sodium chloride flush  3 mL Intravenous Q12H   Continuous Infusions:       Time spent: >30 MINS    [facility]  [facility] Pager [phone]. If 7PM-7AM, please contact night-coverage at [email or web address], password [facility] 12/10/2015, 12:28 PM

## 2015-12-11 ENCOUNTER — Observation Stay (HOSPITAL_COMMUNITY): Payer: Managed Care, Other (non HMO)

## 2015-12-11 ENCOUNTER — Encounter (HOSPITAL_COMMUNITY): Payer: Self-pay | Admitting: Radiology

## 2015-12-11 DIAGNOSIS — R1013 Epigastric pain: Secondary | ICD-10-CM | POA: Diagnosis not present

## 2015-12-11 DIAGNOSIS — R509 Fever, unspecified: Secondary | ICD-10-CM

## 2015-12-11 LAB — LACTATE DEHYDROGENASE: LDH: 190 U/L (ref 98–192)

## 2015-12-11 LAB — COMPREHENSIVE METABOLIC PANEL
ALBUMIN: 2.9 g/dL — AB (ref 3.5–5.0)
ALK PHOS: 65 U/L (ref 38–126)
ALT: 446 U/L — ABNORMAL HIGH (ref 17–63)
AST: 240 U/L — AB (ref 15–41)
Anion gap: 6 (ref 5–15)
BILIRUBIN TOTAL: 1.4 mg/dL — AB (ref 0.3–1.2)
CALCIUM: 8.1 mg/dL — AB (ref 8.9–10.3)
CO2: 27 mmol/L (ref 22–32)
CREATININE: 0.89 mg/dL (ref 0.61–1.24)
Chloride: 104 mmol/L (ref 101–111)
GFR calc Af Amer: >60 mL/min (ref >60)
GFR calc non Af Amer: >60 mL/min (ref >60)
GLUCOSE: 95 mg/dL (ref 65–99)
Potassium: 3.6 mmol/L (ref 3.5–5.1)
Sodium: 137 mmol/L (ref 135–145)
TOTAL PROTEIN: 6.7 g/dL (ref 6.5–8.1)

## 2015-12-11 LAB — CMV IGM: CMV IgM: <30 AU/mL (ref 0.0–29.9)

## 2015-12-11 LAB — CBC
HCT: 36.6 % — ABNORMAL LOW (ref 39.0–52.0)
Hemoglobin: 12.2 g/dL — ABNORMAL LOW (ref 13.0–17.0)
MCH: 27.9 pg (ref 26.0–34.0)
MCHC: 33.3 g/dL (ref 30.0–36.0)
MCV: 83.6 fL (ref 78.0–100.0)
Platelets: 163 10*3/uL (ref 150–400)
RBC: 4.38 MIL/uL (ref 4.22–5.81)
RDW: 13.6 % (ref 11.5–15.5)
WBC: 9.2 10*3/uL (ref 4.0–10.5)

## 2015-12-11 LAB — EPSTEIN-BARR VIRUS VCA, IGM

## 2015-12-11 LAB — CULTURE, GROUP A STREP (THRC)

## 2015-12-11 LAB — GLUCOSE, CAPILLARY: GLUCOSE-CAPILLARY: 92 mg/dL (ref 65–99)

## 2015-12-11 LAB — PROCALCITONIN: Procalcitonin: 1.81 ng/mL

## 2015-12-11 LAB — EPSTEIN-BARR VIRUS VCA, IGG: EBV VCA IgG: 247 U/mL — ABNORMAL HIGH (ref 0.0–17.9)

## 2015-12-11 MED ORDER — FAMOTIDINE 20 MG PO TABS
20.0000 mg | ORAL_TABLET | Freq: Two times a day (BID) | ORAL | Status: DC
  Administered 2015-12-11 – 2015-12-12 (×2): 20 mg via ORAL
  Filled 2015-12-11 (×2): qty 1

## 2015-12-11 MED ORDER — IOPAMIDOL (ISOVUE-370) INJECTION 76%
INTRAVENOUS | Status: AC
  Administered 2015-12-11: 100 mL
  Filled 2015-12-11: qty 100

## 2015-12-11 NOTE — Progress Notes (Signed)
Fever of 100.9, started incentive spirometer.

## 2015-12-11 NOTE — Progress Notes (Addendum)
[facility] PROGRESS NOTE  Maurice Lowe [id number] DOB: 03-23-1981 DOA: 12/09/2015   PCP: No PCP Per Patient     Assessment/Plan: Principal Problem:   Abdominal pain Active Problems:   LFT elevation   Fever, unspecified   Hyponatremia   Elevated LFTs   Epigastric pain   35 y.o. 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 Patient was also found to have elevation in AST and ALT to 503 and 718.  Assessment and plan  1. Abdominal pain, fever, elevated transaminases improving - ASt 503; ALT 718; T bili 1.7; temp 39.2 C, AST/ALT  240/446 - Suspect these are related, suggesting an infectious etiology, though no leukocytosis present and no foci identified onCT abdomen pelvis, Differential diagnosis includes mono/mycoplasma pneumonia We will order viral   , CMV, mycoplasma pneumoniae serologies pending , EBV ,IgG positive , shows immunity to previous infection  Chest x-ray no pneumonia Follow hepatitis panel - Denies significant alcohol and denies use of any medications, including OTC's; denies IVDA or contacts with known hepatitis - Notably, he did have a blood transfusion prior to 1992 during his treatment for NHL  - Supportive care with gentle IVF hydration, antiemetics, analgesia, antipyretics prn  - Repeat CMP in am    infectious disease Dr Johnnye Sima will be consulted once work up complete  CT chest , neck to r/oLN , and LDH to r/o any evidence of lymphoma   2.Hypovolemic hyponatremia improving with IV fluids - Suspected secondary to dehydration  Continue IV fluids - Repeat CMP ordered for am     DVT prophylaxsis Lovenox  Code Status:  Full code     Family Communication: Discussed in detail with the patient, all imaging results, lab results explained to the patient   Disposition Plan:    Anticipate discharge in one to 2 days      Consultants: None  Procedures: None  Antibiotics: Anti-infectives    None         HPI/Subjective: Still has a low grade fever   Objective: Filed Vitals:   12/10/15 1459 12/10/15 2131 12/11/15 0022 12/11/15 0509  BP: 100/61 127/64  116/69  Pulse: 77 97  83  Temp: 98.1 F (36.7 C) 100.5 F (38.1 C)  100.3 F (37.9 C)  TempSrc: Oral Oral  Oral  Resp: 18 18  17   Height:      Weight:   56.564 kg (124 lb 11.2 oz)   SpO2: 100% 99%  100%    Intake/Output Summary (Last 24 hours) at 12/11/15 1229 Last data filed at 12/11/15 1003  Gross per 24 hour  Intake    860 ml  Output   2000 ml  Net  -1140 ml    Exam:  Examination:  General exam: Appears calm and comfortable  Respiratory system: Clear to auscultation. Respiratory effort normal. Cardiovascular system: S1 & S2 heard, RRR. No JVD, murmurs, rubs, gallops or clicks. No pedal edema. Gastrointestinal system: Abdomen is nondistended, soft and nontender. No organomegaly or masses felt. Normal bowel sounds heard. Central nervous system: Alert and oriented. No focal neurological deficits. Extremities: Symmetric 5 x 5 power. Skin: No rashes, lesions or ulcers Psychiatry: Judgement and insight appear normal. Mood & affect appropriate.     Data Reviewed: I have personally reviewed following labs and imaging studies  Micro Results Recent Results (from the  past 240 hour(s))  Rapid strep screen     Status: None   Collection Time: 12/09/15  1:51 PM  Result Value Ref Range Status   Streptococcus, Group A Screen (Direct) NEGATIVE NEGATIVE Final    Comment: (NOTE) A Rapid Antigen test may result negative if the antigen level in the sample is below the detection level of this test. The FDA has not cleared this test as a stand-alone test therefore the rapid antigen negative result has reflexed to a Group A Strep culture.   Culture, group A strep     Status: None (Preliminary  result)   Collection Time: 12/09/15  1:51 PM  Result Value Ref Range Status   Specimen Description THROAT  Final   Special Requests NONE Reflexed from [id number]  Final   Culture CULTURE REINCUBATED FOR BETTER GROWTH  Final   Report Status PENDING  Incomplete  Respiratory Panel by PCR     Status: None   Collection Time: 12/10/15  1:44 PM  Result Value Ref Range Status   Adenovirus NOT DETECTED NOT DETECTED Final   Coronavirus 229E NOT DETECTED NOT DETECTED Final   Coronavirus HKU1 NOT DETECTED NOT DETECTED Final   Coronavirus NL63 NOT DETECTED NOT DETECTED Final   Coronavirus OC43 NOT DETECTED NOT DETECTED Final   Metapneumovirus NOT DETECTED NOT DETECTED Final   Rhinovirus / Enterovirus NOT DETECTED NOT DETECTED Final   Influenza A NOT DETECTED NOT DETECTED Final   Influenza A H1 NOT DETECTED NOT DETECTED Final   Influenza A H1 2009 NOT DETECTED NOT DETECTED Final   Influenza A H3 NOT DETECTED NOT DETECTED Final   Influenza B NOT DETECTED NOT DETECTED Final   Parainfluenza Virus 1 NOT DETECTED NOT DETECTED Final   Parainfluenza Virus 2 NOT DETECTED NOT DETECTED Final   Parainfluenza Virus 3 NOT DETECTED NOT DETECTED Final   Parainfluenza Virus 4 NOT DETECTED NOT DETECTED Final   Respiratory Syncytial Virus NOT DETECTED NOT DETECTED Final   Bordetella pertussis NOT DETECTED NOT DETECTED Final   Chlamydophila pneumoniae NOT DETECTED NOT DETECTED Final   Mycoplasma pneumoniae NOT DETECTED NOT DETECTED Final    Radiology Reports Dg Chest 2 View  12/10/2015  CLINICAL DATA:  Fever. EXAM: CHEST  2 VIEW COMPARISON:  Radiographs of May 07, 2016. FINDINGS: The heart size and mediastinal contours are within normal limits. Both lungs are clear. No pneumothorax or pleural effusion is noted. The visualized skeletal structures are unremarkable. IMPRESSION: No active cardiopulmonary disease. Electronically Signed   By: Marijo Conception, M.D.   On: 12/10/2015 14:22   US Abdomen  Complete  12/09/2015  CLINICAL DATA:  Elevated LFTs.  Abdominal pain. EXAM: ABDOMEN ULTRASOUND COMPLETE COMPARISON:  None. FINDINGS: Gallbladder: No gallstones or wall thickening visualized. No sonographic Murphy sign noted by sonographer. Common bile duct: Diameter: 2 mm Liver: The left hepatic lobe is mildly heterogeneous. No focal masses or abnormalities seen in the liver. IVC: No abnormality visualized. Pancreas: Visualized portion unremarkable. Spleen: Size and appearance within normal limits. Right Kidney: Length: 11.1 cm. Echogenicity within normal limits. No mass or hydronephrosis visualized. Left Kidney: Length: 11 cm. Echogenicity within normal limits. No mass or hydronephrosis visualized. Abdominal aorta: No aneurysm visualized. Other findings: None. IMPRESSION: 1. No cause for the patient's symptoms identified. No acute abnormalities. Electronically Signed   By: Dorise Bullion III M.D   On: 12/09/2015 18:46   Ct Abdomen Pelvis W Contrast  12/09/2015  CLINICAL DATA:  Abdominal pain. Non-Hodgkin's lymphoma and hepatitis-B.  EXAM: CT ABDOMEN AND PELVIS WITH CONTRAST TECHNIQUE: Multidetector CT imaging of the abdomen and pelvis was performed using the standard protocol following bolus administration of intravenous contrast. CONTRAST:  173mL ISOVUE-300 IOPAMIDOL (ISOVUE-300) INJECTION 61% COMPARISON:  None. FINDINGS: Lower chest:  No acute findings. Hepatobiliary: Several hepatic cysts noted, however no liver masses are identified. Gallbladder is unremarkable. Pancreas: No mass, inflammatory changes, or other significant abnormality. Spleen: Within normal limits in size and appearance. Adrenals/Urinary Tract: No masses identified. No evidence of hydronephrosis. Stomach/Bowel: No evidence of obstruction, inflammatory process, or abnormal fluid collections. Normal appendix visualized. Vascular/Lymphatic: No pathologically enlarged lymph nodes. No evidence of abdominal aortic aneurysm. Reproductive: No mass  or other significant abnormality. Other: None. Musculoskeletal:  No suspicious bone lesions identified. IMPRESSION: No acute findings or other significant abnormality identified. Electronically Signed   By: Earle Gell M.D.   On: 12/09/2015 20:46     CBC  Recent Labs Lab 12/09/15 1350 12/10/15 0020 12/11/15 0547  WBC 8.9 8.7 9.2  HGB 14.6 13.5 12.2*  HCT 42.1 39.3 36.6*  PLT 170 156 163  MCV 82.7 83.4 83.6  MCH 28.7 28.7 27.9  MCHC 34.7 34.4 33.3  RDW 13.5 13.7 13.6    Chemistries   Recent Labs Lab 12/09/15 1350 12/10/15 0020 12/11/15 0547  NA 132* 134* 137  K 4.3 3.9 3.6  CL 98* 102 104  CO2 26 26 27   GLUCOSE 95 111* 95  BUN 7 7 <5*  CREATININE 1.01 0.97 0.89  CALCIUM 9.1 8.3* 8.1*  AST 503* 357* 240*  ALT 718* 571* 446*  ALKPHOS 82 71 65  BILITOT 1.7* 1.5* 1.4*   ------------------------------------------------------------------------------------------------------------------ estimated creatinine clearance is 91.9 mL/min (by C-G formula based on Cr of 0.89). ------------------------------------------------------------------------------------------------------------------ No results for input(s): HGBA1C in the last 72 hours. ------------------------------------------------------------------------------------------------------------------ No results for input(s): CHOL, HDL, LDLCALC, TRIG, CHOLHDL, LDLDIRECT in the last 72 hours. ------------------------------------------------------------------------------------------------------------------ No results for input(s): TSH, T4TOTAL, T3FREE, THYROIDAB in the last 72 hours.  Invalid input(s): FREET3 ------------------------------------------------------------------------------------------------------------------  Recent Labs  12/10/15 0020  FERRITIN 1917*  TIBC 207*  IRON 22*    Coagulation profile  Recent Labs Lab 12/10/15 0020  INR 1.19    No results for input(s): DDIMER in the last 72  hours.  Cardiac Enzymes No results for input(s): CKMB, TROPONINI, MYOGLOBIN in the last 168 hours.  Invalid input(s): CK ------------------------------------------------------------------------------------------------------------------ Invalid input(s): POCBNP   CBG:  Recent Labs Lab 12/10/15 0759 12/10/15 1132 12/11/15 0751  GLUCAP 81 116* 92       Studies: Dg Chest 2 View  12/10/2015  CLINICAL DATA:  Fever. EXAM: CHEST  2 VIEW COMPARISON:  Radiographs of May 07, 2016. FINDINGS: The heart size and mediastinal contours are within normal limits. Both lungs are clear. No pneumothorax or pleural effusion is noted. The visualized skeletal structures are unremarkable. IMPRESSION: No active cardiopulmonary disease. Electronically Signed   By: Marijo Conception, M.D.   On: 12/10/2015 14:22   US Abdomen Complete  12/09/2015  CLINICAL DATA:  Elevated LFTs.  Abdominal pain. EXAM: ABDOMEN ULTRASOUND COMPLETE COMPARISON:  None. FINDINGS: Gallbladder: No gallstones or wall thickening visualized. No sonographic Murphy sign noted by sonographer. Common bile duct: Diameter: 2 mm Liver: The left hepatic lobe is mildly heterogeneous. No focal masses or abnormalities seen in the liver. IVC: No abnormality visualized. Pancreas: Visualized portion unremarkable. Spleen: Size and appearance within normal limits. Right Kidney: Length: 11.1 cm. Echogenicity within normal limits. No mass or hydronephrosis visualized. Left Kidney: Length:  11 cm. Echogenicity within normal limits. No mass or hydronephrosis visualized. Abdominal aorta: No aneurysm visualized. Other findings: None. IMPRESSION: 1. No cause for the patient's symptoms identified. No acute abnormalities. Electronically Signed   By: Dorise Bullion III M.D   On: 12/09/2015 18:46   Ct Abdomen Pelvis W Contrast  12/09/2015  CLINICAL DATA:  Abdominal pain. Non-Hodgkin's lymphoma and hepatitis-B. EXAM: CT ABDOMEN AND PELVIS WITH CONTRAST TECHNIQUE:  Multidetector CT imaging of the abdomen and pelvis was performed using the standard protocol following bolus administration of intravenous contrast. CONTRAST:  141mL ISOVUE-300 IOPAMIDOL (ISOVUE-300) INJECTION 61% COMPARISON:  None. FINDINGS: Lower chest:  No acute findings. Hepatobiliary: Several hepatic cysts noted, however no liver masses are identified. Gallbladder is unremarkable. Pancreas: No mass, inflammatory changes, or other significant abnormality. Spleen: Within normal limits in size and appearance. Adrenals/Urinary Tract: No masses identified. No evidence of hydronephrosis. Stomach/Bowel: No evidence of obstruction, inflammatory process, or abnormal fluid collections. Normal appendix visualized. Vascular/Lymphatic: No pathologically enlarged lymph nodes. No evidence of abdominal aortic aneurysm. Reproductive: No mass or other significant abnormality. Other: None. Musculoskeletal:  No suspicious bone lesions identified. IMPRESSION: No acute findings or other significant abnormality identified. Electronically Signed   By: Earle Gell M.D.   On: 12/09/2015 20:46      No results found for: HGBA1C Lab Results  Component Value Date   CREATININE 0.89 12/11/2015       Scheduled Meds: . enoxaparin (LOVENOX) injection  40 mg Subcutaneous Q24H  . famotidine  20 mg Oral BID  . sodium chloride flush  3 mL Intravenous Q12H   Continuous Infusions: . sodium chloride 100 mL/hr at 12/11/15 [id number]        Time spent: >30 MINS    [facility]  [facility] Pager [phone]. If 7PM-7AM, please contact night-coverage at [email or web address], password [facility] 12/11/2015, 12:29 PM

## 2015-12-12 DIAGNOSIS — E871 Hypo-osmolality and hyponatremia: Secondary | ICD-10-CM

## 2015-12-12 DIAGNOSIS — R1011 Right upper quadrant pain: Secondary | ICD-10-CM | POA: Diagnosis not present

## 2015-12-12 DIAGNOSIS — R7989 Other specified abnormal findings of blood chemistry: Secondary | ICD-10-CM | POA: Diagnosis not present

## 2015-12-12 DIAGNOSIS — R509 Fever, unspecified: Secondary | ICD-10-CM | POA: Diagnosis not present

## 2015-12-12 LAB — COMPREHENSIVE METABOLIC PANEL
ALBUMIN: 3.2 g/dL — AB (ref 3.5–5.0)
ALK PHOS: 74 U/L (ref 38–126)
ALT: 450 U/L — ABNORMAL HIGH (ref 17–63)
ANION GAP: 5 (ref 5–15)
AST: 225 U/L — ABNORMAL HIGH (ref 15–41)
BILIRUBIN TOTAL: 1.4 mg/dL — AB (ref 0.3–1.2)
BUN: <5 mg/dL — ABNORMAL LOW (ref 6–20)
CALCIUM: 8.2 mg/dL — AB (ref 8.9–10.3)
CO2: 27 mmol/L (ref 22–32)
Chloride: 102 mmol/L (ref 101–111)
Creatinine, Ser: 0.83 mg/dL (ref 0.61–1.24)
GFR calc Af Amer: >60 mL/min (ref >60)
GLUCOSE: 98 mg/dL (ref 65–99)
Potassium: 3.6 mmol/L (ref 3.5–5.1)
Sodium: 134 mmol/L — ABNORMAL LOW (ref 135–145)
TOTAL PROTEIN: 7.6 g/dL (ref 6.5–8.1)

## 2015-12-12 LAB — GLUCOSE, CAPILLARY: GLUCOSE-CAPILLARY: 120 mg/dL — AB (ref 65–99)

## 2015-12-12 LAB — HIV ANTIBODY (ROUTINE TESTING W REFLEX): HIV Screen 4th Generation wRfx: NONREACTIVE

## 2015-12-12 MED ORDER — FAMOTIDINE 20 MG PO TABS: 20.0000 mg | ORAL_TABLET | Freq: Two times a day (BID) | ORAL | 0 refills | Status: DC

## 2015-12-12 MED ORDER — AMOXICILLIN-POT CLAVULANATE 875-125 MG PO TABS: 1.0000 | ORAL_TABLET | Freq: Two times a day (BID) | ORAL | 0 refills | Status: DC

## 2015-12-12 MED ORDER — AMOXICILLIN-POT CLAVULANATE 875-125 MG PO TABS
1.0000 | ORAL_TABLET | Freq: Two times a day (BID) | ORAL | Status: DC
  Administered 2015-12-12: 1 via ORAL
  Filled 2015-12-12: qty 1

## 2015-12-12 MED ORDER — POLYETHYLENE GLYCOL 3350 17 G PO PACK: 17.0000 g | PACK | Freq: Every day | ORAL | 0 refills | Status: DC | PRN

## 2015-12-12 NOTE — Progress Notes (Signed)
Discharge instructions gone over with patient. Home medications gone over. Prescriptions electronically sent to pharmacy. Care management to assist patient with finding PCP. Diet, activity, and reasons to call the doctor discussed. Patient verbalized understanding of instructions.

## 2015-12-12 NOTE — Progress Notes (Signed)
Patient ambulated over 500 feet. Maintained oxygen saturation of 98-100% on room air.

## 2015-12-12 NOTE — Discharge Summary (Signed)
Physician Discharge Summary  Maurice Lowe MRN: [id number] DOB/AGE: 01-08-1981 35 y.o.  PCP: No PCP Per Patient   Admit date: 12/09/2015 Discharge date: 12/12/2015  Discharge Diagnoses:    Principal Problem:   Abdominal pain Active Problems:   LFT elevation   Fever, unspecified   Hyponatremia   Elevated LFTs   Epigastric pain Acute tonsillitis    Follow-up recommendations Follow-up with PCP in 3-5 days , including all  additional recommended appointments as below Follow-up CBC, CMP in 3-5 days Recommend elective outpatient MRI of the abdomen to further evaluate 3.9 cm lesion in the liver      Current Discharge Medication List    START taking these medications   Details  amoxicillin-clavulanate (AUGMENTIN) 875-125 MG tablet Take 1 tablet by mouth every 12 (twelve) hours. Qty: 20 tablet, Refills: 0    famotidine (PEPCID) 20 MG tablet Take 1 tablet (20 mg total) by mouth 2 (two) times daily. Qty: 30 tablet, Refills: 0    polyethylene glycol (MIRALAX / GLYCOLAX) packet Take 17 g by mouth daily as needed for mild constipation. Qty: 14 each, Refills: 0         Discharge Condition: *Stable  Discharge Instructions Get Medicines reviewed and adjusted: Please take all your medications with you for your next visit with your Primary MD  Please request your Primary MD to go over all hospital tests and procedure/radiological results at the follow up, please ask your Primary MD to get all Hospital records sent to his/her office.  If you experience worsening of your admission symptoms, develop shortness of breath, life threatening emergency, suicidal or homicidal thoughts you must seek medical attention immediately by calling 911 or calling your MD immediately if symptoms less severe.  You must read complete instructions/literature along with all the possible adverse reactions/side effects for all the Medicines you take and that have been prescribed to you. Take any new  Medicines after you have completely understood and accpet all the possible adverse reactions/side effects.   Do not drive when taking Pain medications.   Do not take more than prescribed Pain, Sleep and Anxiety Medications  Special Instructions: If you have smoked or chewed Tobacco in the last 2 yrs please stop smoking, stop any regular Alcohol and or any Recreational drug use.  Wear Seat belts while driving.  Please note  You were cared for by a hospitalist during your hospital stay. Once you are discharged, your primary care physician will handle any further medical issues. Please note that NO REFILLS for any discharge medications will be authorized once you are discharged, as it is imperative that you return to your primary care physician (or establish a relationship with a primary care physician if you do not have one) for your aftercare needs so that they can reassess your need for medications and monitor your lab values.  Discharge Instructions    Diet - low sodium heart healthy    Complete by:  As directed   Increase activity slowly    Complete by:  As directed       Allergies  Allergen Reactions  . Phenergan [Promethazine] Other (See Comments)    Hyperactivity and agitation      Disposition: Final discharge disposition not confirmed   Consults:  None     Significant Diagnostic Studies:  Dg Chest 2 View  Result Date: 12/10/2015 CLINICAL DATA:  Fever. EXAM: CHEST  2 VIEW COMPARISON:  Radiographs of May 07, 2016. FINDINGS: The heart size and mediastinal contours  are within normal limits. Both lungs are clear. No pneumothorax or pleural effusion is noted. The visualized skeletal structures are unremarkable. IMPRESSION: No active cardiopulmonary disease. Electronically Signed   By: Marijo Conception, M.D.   On: 12/10/2015 14:22   Ct Soft Tissue Neck W Contrast  Result Date: 12/11/2015 CLINICAL DATA:  Fever, 5 days duration. Sore throat. Right-sided chest pain.  EXAM: CT NECK WITH CONTRAST TECHNIQUE: Multidetector CT imaging of the neck was performed using the standard protocol following the bolus administration of intravenous contrast. CONTRAST:  100 cc Isovue 370 COMPARISON:  None. FINDINGS: Pharynx and larynx: Tonsils are prominent bilaterally but fairly symmetric. No evidence of low-density to suggest abscess. No other mucosal or submucosal finding. Salivary glands: Parotid and submandibular glands are normal. Thyroid: Normal Lymph nodes: No enlarged or low-density nodes on either side of the neck. Vascular: Arterial and venous structures are patent and normal. Limited intracranial: Normal Visualized orbits: Normal Mastoids and visualized paranasal sinuses: Left maxillary sinusitis. Other air spaces are clear. Skeleton: Normal Upper chest: Normal IMPRESSION: Somewhat prominent tonsils that could go along with tonsillitis or simply reactive tonsillar prominence. No evidence of low-density or asymmetry to suggest tonsillar abscess. Electronically Signed   By: Nelson Chimes M.D.   On: 12/11/2015 13:58   Ct Angio Chest Pe W Or Wo Contrast  Result Date: 12/11/2015 CLINICAL DATA:  Fever, sore throat, right-sided chest pain, fatigue. EXAM: CT ANGIOGRAPHY CHEST WITH CONTRAST TECHNIQUE: Multidetector CT imaging of the chest was performed using the standard protocol during bolus administration of intravenous contrast. Multiplanar CT image reconstructions and MIPs were obtained to evaluate the vascular anatomy. CONTRAST:  100 cc Isovue 370. COMPARISON:  None. FINDINGS: Cardiovascular: Negative for pulmonary embolus. Vascular structures are unremarkable. Heart size normal. No pericardial effusion. Mediastinum/Nodes: No pathologically enlarged mediastinal, hilar or axillary lymph nodes. Esophagus is grossly unremarkable. Lungs/Pleura: Lungs are clear. No pleural fluid. Airway is unremarkable. Upper Abdomen: 8 mm low-attenuation lesion in the dome of the liver is likely a cyst or  hemangioma. Somewhat ill-defined 3.9 cm intermediate density lesion in segment 4 is incompletely imaged. Visualized portions of the left kidney, spleen, pancreas and stomach are grossly unremarkable. Musculoskeletal: No worrisome lytic or sclerotic lesions. Review of the MIP images confirms the above findings. IMPRESSION: 1. Negative for pulmonary embolus. 2. Intermediate attenuation lesion in the liver may represent a hemangioma. Given history of lymphoma, if a more aggressive approach is desired, MR abdomen without and with contrast could be performed, as clinically indicated. Electronically Signed   By: Lorin Picket M.D.   On: 12/11/2015 14:05   US Abdomen Complete  Result Date: 12/09/2015 CLINICAL DATA:  Elevated LFTs.  Abdominal pain. EXAM: ABDOMEN ULTRASOUND COMPLETE COMPARISON:  None. FINDINGS: Gallbladder: No gallstones or wall thickening visualized. No sonographic Murphy sign noted by sonographer. Common bile duct: Diameter: 2 mm Liver: The left hepatic lobe is mildly heterogeneous. No focal masses or abnormalities seen in the liver. IVC: No abnormality visualized. Pancreas: Visualized portion unremarkable. Spleen: Size and appearance within normal limits. Right Kidney: Length: 11.1 cm. Echogenicity within normal limits. No mass or hydronephrosis visualized. Left Kidney: Length: 11 cm. Echogenicity within normal limits. No mass or hydronephrosis visualized. Abdominal aorta: No aneurysm visualized. Other findings: None. IMPRESSION: 1. No cause for the patient's symptoms identified. No acute abnormalities. Electronically Signed   By: Dorise Bullion III M.D   On: 12/09/2015 18:46   Ct Abdomen Pelvis W Contrast  Result Date: 12/09/2015 CLINICAL DATA:  Abdominal  pain. Non-Hodgkin's lymphoma and hepatitis-B. EXAM: CT ABDOMEN AND PELVIS WITH CONTRAST TECHNIQUE: Multidetector CT imaging of the abdomen and pelvis was performed using the standard protocol following bolus administration of intravenous  contrast. CONTRAST:  11m ISOVUE-300 IOPAMIDOL (ISOVUE-300) INJECTION 61% COMPARISON:  None. FINDINGS: Lower chest:  No acute findings. Hepatobiliary: Several hepatic cysts noted, however no liver masses are identified. Gallbladder is unremarkable. Pancreas: No mass, inflammatory changes, or other significant abnormality. Spleen: Within normal limits in size and appearance. Adrenals/Urinary Tract: No masses identified. No evidence of hydronephrosis. Stomach/Bowel: No evidence of obstruction, inflammatory process, or abnormal fluid collections. Normal appendix visualized. Vascular/Lymphatic: No pathologically enlarged lymph nodes. No evidence of abdominal aortic aneurysm. Reproductive: No mass or other significant abnormality. Other: None. Musculoskeletal:  No suspicious bone lesions identified. IMPRESSION: No acute findings or other significant abnormality identified. Electronically Signed   By: JEarle GellM.D.   On: 12/09/2015 20:46          Filed Weights   12/09/15 2310 12/10/15 0525 12/11/15 0022  Weight: 56.2 kg (124 lb) 56.2 kg (124 lb) 56.6 kg (124 lb 11.2 oz)     Microbiology: Recent Results (from the past 240 hour(s))  Rapid strep screen     Status: None   Collection Time: 12/09/15  1:51 PM  Result Value Ref Range Status   Streptococcus, Group A Screen (Direct) NEGATIVE NEGATIVE Final    Comment: (NOTE) A Rapid Antigen test may result negative if the antigen level in the sample is below the detection level of this test. The FDA has not cleared this test as a stand-alone test therefore the rapid antigen negative result has reflexed to a Group A Strep culture.   Culture, group A strep     Status: None   Collection Time: 12/09/15  1:51 PM  Result Value Ref Range Status   Specimen Description THROAT  Final   Special Requests NONE Reflexed from [id number] Final   Culture NO GROUP A STREP (S.PYOGENES) ISOLATED  Final   Report Status 12/11/2015 FINAL  Final  Culture, blood (Routine X  2) w Reflex to ID Panel     Status: None (Preliminary result)   Collection Time: 12/10/15 11:49 AM  Result Value Ref Range Status   Specimen Description BLOOD LEFT ANTECUBITAL  Final   Special Requests BOTTLES DRAWN AEROBIC AND ANAEROBIC 10CC  Final   Culture NO GROWTH 1 DAY  Final   Report Status PENDING  Incomplete  Culture, blood (Routine X 2) w Reflex to ID Panel     Status: None (Preliminary result)   Collection Time: 12/10/15 12:01 PM  Result Value Ref Range Status   Specimen Description BLOOD BLOOD LEFT FOREARM  Final   Special Requests BOTTLES DRAWN AEROBIC AND ANAEROBIC 10CC  Final   Culture NO GROWTH 1 DAY  Final   Report Status PENDING  Incomplete  Respiratory Panel by PCR     Status: None   Collection Time: 12/10/15  1:44 PM  Result Value Ref Range Status   Adenovirus NOT DETECTED NOT DETECTED Final   Coronavirus 229E NOT DETECTED NOT DETECTED Final   Coronavirus HKU1 NOT DETECTED NOT DETECTED Final   Coronavirus NL63 NOT DETECTED NOT DETECTED Final   Coronavirus OC43 NOT DETECTED NOT DETECTED Final   Metapneumovirus NOT DETECTED NOT DETECTED Final   Rhinovirus / Enterovirus NOT DETECTED NOT DETECTED Final   Influenza A NOT DETECTED NOT DETECTED Final   Influenza A H1 NOT DETECTED NOT DETECTED Final  Influenza A H1 2009 NOT DETECTED NOT DETECTED Final   Influenza A H3 NOT DETECTED NOT DETECTED Final   Influenza B NOT DETECTED NOT DETECTED Final   Parainfluenza Virus 1 NOT DETECTED NOT DETECTED Final   Parainfluenza Virus 2 NOT DETECTED NOT DETECTED Final   Parainfluenza Virus 3 NOT DETECTED NOT DETECTED Final   Parainfluenza Virus 4 NOT DETECTED NOT DETECTED Final   Respiratory Syncytial Virus NOT DETECTED NOT DETECTED Final   Bordetella pertussis NOT DETECTED NOT DETECTED Final   Chlamydophila pneumoniae NOT DETECTED NOT DETECTED Final   Mycoplasma pneumoniae NOT DETECTED NOT DETECTED Final       Blood Culture    Component Value Date/Time   SDES BLOOD BLOOD  LEFT FOREARM 12/10/2015 1201   SPECREQUEST BOTTLES DRAWN AEROBIC AND ANAEROBIC 10CC 12/10/2015 1201   CULT NO GROWTH 1 DAY 12/10/2015 1201   REPTSTATUS PENDING 12/10/2015 1201      Labs: Results for orders placed or performed during the hospital encounter of 12/09/15 (from the past 48 hour(s))  Culture, blood (Routine X 2) w Reflex to ID Panel     Status: None (Preliminary result)   Collection Time: 12/10/15 12:01 PM  Result Value Ref Range   Specimen Description BLOOD BLOOD LEFT FOREARM    Special Requests BOTTLES DRAWN AEROBIC AND ANAEROBIC 10CC    Culture NO GROWTH 1 DAY    Report Status PENDING   Respiratory Panel by PCR     Status: None   Collection Time: 12/10/15  1:44 PM  Result Value Ref Range   Adenovirus NOT DETECTED NOT DETECTED   Coronavirus 229E NOT DETECTED NOT DETECTED   Coronavirus HKU1 NOT DETECTED NOT DETECTED   Coronavirus NL63 NOT DETECTED NOT DETECTED   Coronavirus OC43 NOT DETECTED NOT DETECTED   Metapneumovirus NOT DETECTED NOT DETECTED   Rhinovirus / Enterovirus NOT DETECTED NOT DETECTED   Influenza A NOT DETECTED NOT DETECTED   Influenza A H1 NOT DETECTED NOT DETECTED   Influenza A H1 2009 NOT DETECTED NOT DETECTED   Influenza A H3 NOT DETECTED NOT DETECTED   Influenza B NOT DETECTED NOT DETECTED   Parainfluenza Virus 1 NOT DETECTED NOT DETECTED   Parainfluenza Virus 2 NOT DETECTED NOT DETECTED   Parainfluenza Virus 3 NOT DETECTED NOT DETECTED   Parainfluenza Virus 4 NOT DETECTED NOT DETECTED   Respiratory Syncytial Virus NOT DETECTED NOT DETECTED   Bordetella pertussis NOT DETECTED NOT DETECTED   Chlamydophila pneumoniae NOT DETECTED NOT DETECTED   Mycoplasma pneumoniae NOT DETECTED NOT DETECTED  Procalcitonin     Status: None   Collection Time: 12/11/15  5:47 AM  Result Value Ref Range   Procalcitonin 1.81 ng/mL    Comment:        Interpretation: PCT > 0.5 ng/mL and <= 2 ng/mL: Systemic infection (sepsis) is possible, but other conditions are  known to elevate PCT as well. (NOTE)         ICU PCT Algorithm               Non ICU PCT Algorithm    ----------------------------     ------------------------------         PCT < 0.25 ng/mL                 PCT < 0.1 ng/mL     Stopping of antibiotics            Stopping of antibiotics       strongly encouraged.  strongly encouraged.    ----------------------------     ------------------------------       PCT level decrease by               PCT < 0.25 ng/mL       >= 80% from peak PCT       OR PCT 0.25 - 0.5 ng/mL          Stopping of antibiotics                                             encouraged.     Stopping of antibiotics           encouraged.    ----------------------------     ------------------------------       PCT level decrease by              PCT >= 0.25 ng/mL       < 80% from peak PCT        AND PCT >= 0.5 ng/mL             Continuing antibiotics                                              encouraged.       Continuing antibiotics            encouraged.    ----------------------------     ------------------------------     PCT level increase compared          PCT > 0.5 ng/mL         with peak PCT AND          PCT >= 0.5 ng/mL             Escalation of antibiotics                                          strongly encouraged.      Escalation of antibiotics        strongly encouraged.   Comprehensive metabolic panel     Status: Abnormal   Collection Time: 12/11/15  5:47 AM  Result Value Ref Range   Sodium 137 135 - 145 mmol/L   Potassium 3.6 3.5 - 5.1 mmol/L   Chloride 104 101 - 111 mmol/L   CO2 27 22 - 32 mmol/L   Glucose, Bld 95 65 - 99 mg/dL   BUN <5 (L) 6 - 20 mg/dL   Creatinine, Ser 0.89 0.61 - 1.24 mg/dL   Calcium 8.1 (L) 8.9 - 10.3 mg/dL   Total Protein 6.7 6.5 - 8.1 g/dL   Albumin 2.9 (L) 3.5 - 5.0 g/dL   AST 240 (H) 15 - 41 U/L   ALT 446 (H) 17 - 63 U/L   Alkaline Phosphatase 65 38 - 126 U/L   Total Bilirubin 1.4 (H) 0.3 - 1.2 mg/dL    GFR calc non Af Amer >60 >60 mL/min   GFR calc Af Amer >60 >60 mL/min    Comment: (NOTE) The eGFR has been calculated using the CKD EPI equation. This calculation has not been validated in all clinical situations. eGFR's persistently <  60 mL/min signify possible Chronic Kidney Disease.    Anion gap 6 5 - 15  CBC     Status: Abnormal   Collection Time: 12/11/15  5:47 AM  Result Value Ref Range   WBC 9.2 4.0 - 10.5 K/uL   RBC 4.38 4.22 - 5.81 MIL/uL   Hemoglobin 12.2 (L) 13.0 - 17.0 g/dL   HCT 36.6 (L) 39.0 - 52.0 %   MCV 83.6 78.0 - 100.0 fL   MCH 27.9 26.0 - 34.0 pg   MCHC 33.3 30.0 - 36.0 g/dL   RDW 13.6 11.5 - 15.5 %   Platelets 163 150 - 400 K/uL  Glucose, capillary     Status: None   Collection Time: 12/11/15  7:51 AM  Result Value Ref Range   Glucose-Capillary 92 65 - 99 mg/dL   Comment 1 Notify RN   HIV antibody     Status: None   Collection Time: 12/11/15  1:57 PM  Result Value Ref Range   HIV Screen 4th Generation wRfx Non Reactive Non Reactive    Comment: (NOTE) Performed At: [facility] [place], [place] [id number] Lindon Romp MD [id number]   Lactate dehydrogenase     Status: None   Collection Time: 12/11/15  1:57 PM  Result Value Ref Range   LDH 190 98 - 192 U/L  Comprehensive metabolic panel     Status: Abnormal   Collection Time: 12/12/15  2:40 AM  Result Value Ref Range   Sodium 134 (L) 135 - 145 mmol/L   Potassium 3.6 3.5 - 5.1 mmol/L   Chloride 102 101 - 111 mmol/L   CO2 27 22 - 32 mmol/L   Glucose, Bld 98 65 - 99 mg/dL   BUN <5 (L) 6 - 20 mg/dL   Creatinine, Ser 0.83 0.61 - 1.24 mg/dL   Calcium 8.2 (L) 8.9 - 10.3 mg/dL   Total Protein 7.6 6.5 - 8.1 g/dL   Albumin 3.2 (L) 3.5 - 5.0 g/dL   AST 225 (H) 15 - 41 U/L   ALT 450 (H) 17 - 63 U/L   Alkaline Phosphatase 74 38 - 126 U/L   Total Bilirubin 1.4 (H) 0.3 - 1.2 mg/dL   GFR calc non Af Amer >60 >60 mL/min   GFR calc Af Amer >60 >60 mL/min    Comment: (NOTE) The  eGFR has been calculated using the CKD EPI equation. This calculation has not been validated in all clinical situations. eGFR's persistently <60 mL/min signify possible Chronic Kidney Disease.    Anion gap 5 5 - 15  Glucose, capillary     Status: Abnormal   Collection Time: 12/12/15  7:50 AM  Result Value Ref Range   Glucose-Capillary 120 (H) 65 - 99 mg/dL   Comment 1 Notify RN      Lipid Panel  No results found for: CHOL, TRIG, HDL, CHOLHDL, VLDL, LDLCALC, LDLDIRECT   No results found for: HGBA1C   Lab Results  Component Value Date   CREATININE 0.83 12/12/2015     HPI :  35 y.o. 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 Patient reports being in his usual state of health until several days ago when he noted the development of a sore throat and subjective fevers. Symptoms persisted and yesterday were accompanied by severe epigastric abdominal pain. Patient has never experienced similar symptoms  previously, takes no daily medications, denies sick contacts or long distance travel, and denies any significant alcohol use or use of illicit substances. Patient describes his pain as severe, nonradiating, localized to the epigastrium, exacerbated by palpation, and with no alleviating factors identified.   His history is notable for a remote blood transfusion sometime around 1990.   ED Course: Upon arrival to the ED, patient is found to be febrile to 39.2 C, saturating well on room air, and with vitals otherwise stable. CMP features a mild hyponatremia and hypochloremia, elevation in AST and ALT to 503 and 718, respectively, and total bilirubin of 1.7. CBC is unremarkable and lipase is within normal limits at 23. Rapid mono and strep testing was performed in the emergency department and negative. Ultrasound of the abdomen was performed  and unremarkable. Contrast-enhanced CT of the abdomen and pelvis was also obtained in the ED and there are no acute or significant findings identified.   He remained hemodynamically stable in the ED and will be observed on the medical-surgical unit for ongoing evaluation and management of fever with abdominal pain and significant elevation in LFTs.   HOSPITAL COURSE:    1. Abdominal pain, fever, elevated transaminases improving - ASt 503; ALT 718 on admission; T bili 1.7; temp 39.2 C, AST/ALT now to 25/450   - Suspect these are related, suggesting an infectious etiology, though no leukocytosis present and no foci identified on CT chest, abdomen pelvis,patient does have lesion in his liver that needs to be evaluated with an outpatient MRI Differential diagnosis includes mono/mycoplasma pneumonia  CMV, EBV serologies negative except  EBV ,IgG positive , shows immunity to previous infection , Mycoplasma pneumoniae serology is pending, acute hepatitis panel is pending Chest x-ray no pneumonia - Denies significant alcohol and denies use of any medications, including OTC's; denies IVDA or contacts with known hepatitis - Notably, he did have a blood transfusion prior to 1992 during his treatment for NHL  - Supportive care with gentle IVF hydration, antiemetics, analgesia, antipyretics prn  - Repeat CMP in am    infectious disease Dr Johnnye Sima  was  consulted via telephone So far patient does have inflamed tonsils, no evidence of any infection, Currently no explanation for elevated AST/ALT but it is improving, avoid Tylenol/NSAIDs   2.Hypovolemic hyponatremia Resolved with IV fluids   3. Acute tonsillitis-patient started on Augmentin for 10 days  Discharge Exam:  * Blood pressure (!) 103/58, pulse 88, temperature 100.1 F (37.8 C), temperature source Oral, resp. rate 17, height 5' 6"  (1.676 m), weight 56.6 kg (124 lb 11.2 oz), SpO2 100 %. Constitutional: NAD, calm, comfortable Eyes:  PERTLA, lids and conjunctivae normal, slightly icteric sclerae ENMT: Mucous membranes are moist. Posterior pharynx clear of any exudate or lesions.   Neck: normal, supple, no masses, no thyromegaly Respiratory: clear to auscultation bilaterally, no wheezing, no crackles. Normal respiratory effort.   Cardiovascular: S1 & S2 heard, regular rate and rhythm, no significant murmurs. No extremity edema. 2+ pedal pulses. No carotid bruits. No significant JVD. Abdomen: No distension, tender in epigastrium only, no rebound pain or guarding, no masses palpated. Bowel sounds normal.  Musculoskeletal: no clubbing / cyanosis. No joint deformity upper and lower extremities. Normal muscle tone.  Skin: no significant rashes, lesions, ulcers. Warm, dry, well-perfused. Neurologic: CN 2-12 grossly intact. Sensation intact, DTR normal. Strength 5/5 in all 4 limbs.  Psychiatric: Normal judgment and insight. Alert and oriented x 3. Normal mood and affect.  Follow-up Information    pcp. Schedule an appointment as soon as possible for a visit in 1 week(s).           SignedReyne Dumas 12/12/2015, 11:58 AM        Time spent >45 mins

## 2015-12-13 LAB — HEPATITIS PANEL, ACUTE
HCV AB: 0.1 {s_co_ratio} (ref 0.0–0.9)
HEP A IGM: NEGATIVE
HEP B C IGM: NEGATIVE
HEP B S AG: POSITIVE — AB

## 2015-12-13 LAB — MYCOPLASMA PNEUMONIAE ANTIBODY, IGM: Mycoplasma pneumo IgM: <770 U/mL (ref 0–769)

## 2015-12-13 NOTE — Progress Notes (Signed)
Pt discharged on Sunday;came to the floor this evening asking for a note from the doctor stating that he,the patient,can go back to work. Paged [facility] admitting number for [facility]. Was advised by Ulis Rias that patient should call the [facility] number in the am.  Gave patient the number and he says he will call in the morning.

## 2015-12-14 LAB — RSV(RESPIRATORY SYNCYTIAL VIRUS) AB, BLOOD: RSV AB: NEGATIVE

## 2015-12-15 LAB — CULTURE, BLOOD (ROUTINE X 2)
CULTURE: NO GROWTH
CULTURE: NO GROWTH

## 2015-12-21 ENCOUNTER — Encounter: Payer: Self-pay | Admitting: Family Medicine

## 2015-12-21 ENCOUNTER — Ambulatory Visit (INDEPENDENT_AMBULATORY_CARE_PROVIDER_SITE_OTHER): Payer: Managed Care, Other (non HMO) | Admitting: Family Medicine

## 2015-12-21 VITALS — BP 120/72 | HR 92 | Temp 98.5°F | Resp 12 | Ht 66.0 in | Wt 120.5 lb

## 2015-12-21 DIAGNOSIS — K7689 Other specified diseases of liver: Secondary | ICD-10-CM

## 2015-12-21 DIAGNOSIS — R7989 Other specified abnormal findings of blood chemistry: Secondary | ICD-10-CM | POA: Diagnosis not present

## 2015-12-21 DIAGNOSIS — R1013 Epigastric pain: Secondary | ICD-10-CM

## 2015-12-21 DIAGNOSIS — R945 Abnormal results of liver function studies: Secondary | ICD-10-CM

## 2015-12-21 LAB — COMPREHENSIVE METABOLIC PANEL
ALK PHOS: 105 U/L (ref 39–117)
ALT: 1504 U/L — AB (ref 0–53)
AST: 1144 U/L — AB (ref 0–37)
Albumin: 3.4 g/dL — ABNORMAL LOW (ref 3.5–5.2)
BILIRUBIN TOTAL: 0.5 mg/dL (ref 0.2–1.2)
BUN: 8 mg/dL (ref 6–23)
CALCIUM: 8.8 mg/dL (ref 8.4–10.5)
CO2: 29 mEq/L (ref 19–32)
CREATININE: 0.77 mg/dL (ref 0.40–1.50)
Chloride: 102 mEq/L (ref 96–112)
GFR: 146.95 mL/min (ref >60.00)
GLUCOSE: 100 mg/dL — AB (ref 70–99)
Potassium: 4.3 mEq/L (ref 3.5–5.1)
Sodium: 137 mEq/L (ref 135–145)
TOTAL PROTEIN: 7.9 g/dL (ref 6.0–8.3)

## 2015-12-21 LAB — CBC WITH DIFFERENTIAL/PLATELET
BASOS ABS: 0 10*3/uL (ref 0.0–0.1)
Basophils Relative: 0.5 % (ref 0.0–3.0)
EOS ABS: 0 10*3/uL (ref 0.0–0.7)
Eosinophils Relative: 0.3 % (ref 0.0–5.0)
HEMATOCRIT: 40.8 % (ref 39.0–52.0)
Hemoglobin: 13.6 g/dL (ref 13.0–17.0)
LYMPHS PCT: 33 % (ref 12.0–46.0)
Lymphs Abs: 1.2 10*3/uL (ref 0.7–4.0)
MCHC: 33.4 g/dL (ref 30.0–36.0)
MCV: 84.9 fl (ref 78.0–100.0)
Monocytes Absolute: 0.7 10*3/uL (ref 0.1–1.0)
NEUTROS PCT: 47 % (ref 43.0–77.0)
Neutro Abs: 1.6 10*3/uL (ref 1.4–7.7)
Platelets: 223 10*3/uL (ref 150.0–400.0)
RBC: 4.81 Mil/uL (ref 4.22–5.81)
RDW: 14.9 % (ref 11.5–15.5)
WBC: 3.5 10*3/uL — ABNORMAL LOW (ref 4.0–10.5)

## 2015-12-21 NOTE — Progress Notes (Signed)
HPI:   [name] is a 35 y.o. male, who is here today to establish care with me.  Former PCP: Dr Ernie Hew. Last preventive routine visit: 2016.  He was recently discharged from hospital, he was admitted on 12/09/15 and discharged on 12/12/15. He presented to the ER on 12/09/15 c/o upper abdominal pain, odynophagia, and fever. Hx of non-Hodgkin's lymphoma about 20 years ago.   12/11/15 Neck [id number] prominent tonsils that could go along with tonsillitis or simply reactive tonsillar prominence. No evidence of low-density or asymmetry to suggest tonsillar abscess.  CTA: 1. Negative for pulmonary embolus. 2. Intermediate attenuation lesion in the liver may represent a hemangioma. Given history of lymphoma, if a more aggressive approach is desired, MR abdomen without and with contrast could be performed, as clinically indicated.  Abdominal CT: No acute findings or other significant abnormality identified. Several hepatic cysts noted, however no liver masses are identified. Gallbladder is unremarkable Respiratory panel negative. LDH in normal range.  HIV, Bcx x 2, CMV Ig M,RSV, Epstain-Barr virus, Lipase, hepatitis panel ,mono, strep group A negative. Elevated transaminases. He denies any Hx of IV drugs or alcohol abuse. He uses marijuana sometimes , just when he travels to [place], when marijuana is legal, and drinks liquor (1 pint) in 2 days. Last time a month ago.  12/11/15 ALT 446 (571,718), AST 240 (357,503). T Bili 1.4 (1.5, 1.7); rest of CMP otherwise normal. He is on Famotidine and had 2 days left of Abx.  Epigastric pain improving,stubbing and twiting feeling still hurts when he touch area. Appetite still low but improving, has gained about 5 pounds since discharged.  Occasional low grade fever, temp usually in 98 F, + chills. No nausea, vomiting,diarrhea, or constipation. Bowel habits back to his normal. No dysuria, gross hematuria, or frequency. Fatigue  also getting better.  He is back to work full time.  Concerns today: None.     Review of Systems  Constitutional: Positive for appetite change, chills, fatigue and fever. Negative for activity change and unexpected weight change.  HENT: Negative for dental problem, facial swelling, mouth sores, nosebleeds, sore throat, trouble swallowing and voice change.   Eyes: Negative for pain, redness and visual disturbance.  Respiratory: Negative for apnea, cough, shortness of breath and wheezing.   Cardiovascular: Negative for chest pain, palpitations and leg swelling.  Gastrointestinal: Positive for abdominal pain. Negative for diarrhea, nausea and vomiting.  Endocrine: Negative for cold intolerance, heat intolerance, polydipsia and polyuria.  Genitourinary: Negative for decreased urine volume, dysuria and hematuria.  Musculoskeletal: Negative for arthralgias and myalgias.  Skin: Negative for rash and wound.  Neurological: Negative for dizziness, seizures, syncope, weakness, numbness and headaches.  Hematological: Negative for adenopathy. Does not bruise/bleed easily.  Psychiatric/Behavioral: Negative for confusion, hallucinations and sleep disturbance.      Current Outpatient Prescriptions on File Prior to Visit  Medication Sig Dispense Refill  . amoxicillin-clavulanate (AUGMENTIN) 875-125 MG tablet Take 1 tablet by mouth every 12 (twelve) hours. 20 tablet 0  . famotidine (PEPCID) 20 MG tablet Take 1 tablet (20 mg total) by mouth 2 (two) times daily. 30 tablet 0   No current facility-administered medications on file prior to visit.      Past Medical History:  Diagnosis Date  . Non Hodgkin's lymphoma ([place])    Allergies  Allergen Reactions  . Phenergan [Promethazine] Other (See Comments)    Hyperactivity and agitation    Family History  Problem Relation Age of Onset  .  Heart disease Mother   . Stroke Maternal Grandfather   . Hepatitis Neg Hx     Social History   Social  History  . Marital status: Married    Spouse name: N/A  . Number of children: N/A  . Years of education: N/A   Social History Main Topics  . Smoking status: Never Smoker  . Smokeless tobacco: Never Used  . Alcohol use 1.8 oz/week    3 Cans of beer per week  . Drug use:     Types: Marijuana     Comment: when travels to [place]  . Sexual activity: Not Asked   Other Topics Concern  . None   Social History Narrative  . None    Vitals:   12/21/15 0743  BP: 120/72  Pulse: 92  Resp: 12  Temp: 98.5 F (36.9 C)    Body mass index is 19.45 kg/m.  O2 sat at RA 97%.    Physical Exam  Nursing note and vitals reviewed. Constitutional: He is oriented to person, place, and time. He appears well-developed and well-nourished. He does not appear ill. No distress.  HENT:  Head: Atraumatic.  Mouth/Throat: Uvula is midline, oropharynx is clear and moist and mucous membranes are normal.  Eyes: Conjunctivae and EOM are normal. Pupils are equal, round, and reactive to light.  Neck: No tracheal deviation present. No thyroid mass and no thyromegaly present.  Cardiovascular: Normal rate and regular rhythm.   No murmur heard. Pulses:      Dorsalis pedis pulses are 2+ on the right side, and 2+ on the left side.  Respiratory: Effort normal and breath sounds normal. No respiratory distress.  GI: Soft. He exhibits no mass. There is no hepatomegaly. There is no tenderness.  Musculoskeletal: He exhibits no edema.  Lymphadenopathy:    He has no cervical adenopathy.       Right: No supraclavicular adenopathy present.       Left: No supraclavicular adenopathy present.  Neurological: He is alert and oriented to person, place, and time. He has normal strength. Coordination and gait normal.  Skin: Skin is warm. No rash noted. No erythema.  Psychiatric: He has a normal mood and affect.  Well groomed, good eye contact.      ASSESSMENT AND PLAN:   Lab Results  Component Value Date   WBC 3.5  (L) 12/21/2015   HGB 13.6 12/21/2015   HCT 40.8 12/21/2015   MCV 84.9 12/21/2015   PLT 223.0 12/21/2015       Chemistry      Component Value Date/Time   NA 137 12/21/2015 0841   K 4.3 12/21/2015 0841   CL 102 12/21/2015 0841   CO2 29 12/21/2015 0841   BUN 8 12/21/2015 0841   CREATININE 0.77 12/21/2015 0841      Component Value Date/Time   CALCIUM 8.8 12/21/2015 0841   ALKPHOS 105 12/21/2015 0841   AST 1,144 (H) 12/21/2015 0841   ALT 1,504 (H) 12/21/2015 0841   BILITOT 0.5 12/21/2015 0841       Couper was seen today for new patient (initial visit).  Diagnoses and all orders for this visit:  Epigastric pain  Improving. Continue monitoring temp. No changes in Famotidine for now. Avoid food that can irritate GI. Clearly instructed about warning signs.  -     CBC with Differential/Platelet  LFT elevation  Negative hepatitis and CMV work-up. ? Autoimmune,infiltrative (given Hx of lymphoma). Further recommendations will be given according to lab results. Avoid alcohol  intake.  -     Cancel: MR Liver W Contrast; Future -     CMP -     MR Abdomen W Wo Contrast; Future  Liver cyst  ? Hemangioma. Liver MRI will be arranged.  -     MR Abdomen W Wo Contrast; Future            Jesus Nevills G. [name], MD  [facility]. [place] office.

## 2015-12-21 NOTE — Progress Notes (Signed)
Pre visit review using our clinic review tool, if applicable. No additional management support is needed unless otherwise documented below in the visit note. 

## 2015-12-21 NOTE — Patient Instructions (Addendum)
A few things to remember from today's visit:   Epigastric pain - Plan: CBC with Differential/Platelet  LFT elevation - Plan: MR Liver W Contrast, CMP  Liver cyst - Plan: MR Liver W Contrast  No changes today. Small meals more frequent. Monitor for worsening symptoms.   Please be sure medication list is accurate. If a new problem present, please set up appointment sooner than planned today.

## 2015-12-22 ENCOUNTER — Telehealth: Payer: Self-pay | Admitting: Family Medicine

## 2015-12-22 DIAGNOSIS — R7401 Elevation of levels of liver transaminase levels: Secondary | ICD-10-CM

## 2015-12-22 DIAGNOSIS — R740 Nonspecific elevation of levels of transaminase and lactic acid dehydrogenase [LDH]: Principal | ICD-10-CM

## 2015-12-22 NOTE — Telephone Encounter (Signed)
I tried to reach Maurice Lowe because ALT and AST area worse. I left message asking him to call us back. Liver MRI pending. I just placed a GI referral. If symptoms stab;e I do not think he needs to go back to hospital, if abdominal pain starts getting worse go to the ER. Stop Famotidine and avoid medications in general for now.  Thanks, BJ

## 2015-12-23 NOTE — Telephone Encounter (Signed)
Left voicemail for patient to call the office back.   

## 2015-12-24 ENCOUNTER — Encounter: Payer: Self-pay | Admitting: Family Medicine

## 2015-12-24 NOTE — Telephone Encounter (Signed)
Left voicemail for patient to call the office back.   

## 2015-12-27 ENCOUNTER — Ambulatory Visit: Payer: Managed Care, Other (non HMO) | Admitting: Physician Assistant

## 2015-12-27 ENCOUNTER — Telehealth: Payer: Self-pay | Admitting: Gastroenterology

## 2015-12-27 NOTE — Telephone Encounter (Signed)
Patient has been notified of appointment.  

## 2015-12-27 NOTE — Telephone Encounter (Signed)
Left voicemail for patient to call the office back.   

## 2015-12-27 NOTE — Telephone Encounter (Signed)
Scheduled with Nicoletta Ba, PA on 12/28/15 at 2:00 PM. Please, call patient with appointment,

## 2015-12-27 NOTE — Telephone Encounter (Signed)
Please advise as to urgency of scheduling.

## 2015-12-28 ENCOUNTER — Other Ambulatory Visit: Payer: Self-pay | Admitting: *Deleted

## 2015-12-28 ENCOUNTER — Other Ambulatory Visit (INDEPENDENT_AMBULATORY_CARE_PROVIDER_SITE_OTHER): Payer: Managed Care, Other (non HMO)

## 2015-12-28 ENCOUNTER — Encounter: Payer: Self-pay | Admitting: Physician Assistant

## 2015-12-28 ENCOUNTER — Ambulatory Visit (INDEPENDENT_AMBULATORY_CARE_PROVIDER_SITE_OTHER): Payer: Managed Care, Other (non HMO) | Admitting: Physician Assistant

## 2015-12-28 VITALS — BP 120/62 | HR 88 | Ht 66.0 in | Wt 126.2 lb

## 2015-12-28 DIAGNOSIS — K7200 Acute and subacute hepatic failure without coma: Secondary | ICD-10-CM

## 2015-12-28 DIAGNOSIS — B179 Acute viral hepatitis, unspecified: Secondary | ICD-10-CM

## 2015-12-28 LAB — COMPREHENSIVE METABOLIC PANEL
ALK PHOS: 89 U/L (ref 39–117)
ALT: 714 U/L — ABNORMAL HIGH (ref 0–53)
AST: 377 U/L — AB (ref 0–37)
Albumin: 3.3 g/dL — ABNORMAL LOW (ref 3.5–5.2)
BUN: 10 mg/dL (ref 6–23)
CHLORIDE: 103 meq/L (ref 96–112)
CO2: 30 mEq/L (ref 19–32)
CREATININE: 0.66 mg/dL (ref 0.40–1.50)
Calcium: 8.7 mg/dL (ref 8.4–10.5)
GFR: 175.54 mL/min (ref >60.00)
GLUCOSE: 92 mg/dL (ref 70–99)
POTASSIUM: 3.5 meq/L (ref 3.5–5.1)
Sodium: 138 mEq/L (ref 135–145)
TOTAL PROTEIN: 7.4 g/dL (ref 6.0–8.3)
Total Bilirubin: 0.8 mg/dL (ref 0.2–1.2)

## 2015-12-28 LAB — PROTIME-INR
INR: 1.3 ratio — ABNORMAL HIGH (ref 0.8–1.0)
Prothrombin Time: 13.3 s — ABNORMAL HIGH (ref 9.6–13.1)

## 2015-12-28 NOTE — Patient Instructions (Addendum)
Please go to the basement level to have your labs drawn.   We made you an appointment with Nicoletta Ba PA on 01-20-2016. Dr. Loletha Carrow is the supervising MD that day.

## 2015-12-28 NOTE — Progress Notes (Signed)
Subjective:    Patient ID: Maurice Lowe, male    DOB: 1981/04/19, 35 y.o.   MRN: [id number]  HPI Ranier is a pleasant 35 year old white male, new to GI today referred by Dr. Betty [name] for evaluation of an acute hepatitis. Patient is generally healthy without any known chronic medical problems but did have treatment for non-Hodgkin's lymphoma as a teenager . Patient was hospitalized on 12/09/2015 after he presented to the emergency room with complaints of fatigue and upper abdominal pain. He says he been feeling poorly for about a week prior to that its generalized fatigue some low-grade fevers and a sore throat and then developed upper abdominal pain which became fairly constant and send him to the emergency room. He was febrile on admission and noted to have markedly elevated LFTs with a total bili of 1.5 alkaline phosphatase of 71 AST of 357 and ALT of 571. Ultrasound was done showing some mild heterogeneity of the left hepatic lobe CT of the abdomen and pelvis showed small hepatic cysts and was otherwise negative CT and G of the chest was done on 12/11/2015-this was negative for pulmonary embolus, did show an 8 mm low-attenuation lesion in the dome of the liver likely a cyst or hemangioma and somewhat ill-defined 3.9 cm intermediate density lesion in segment 4 incompletely imaged, otherwise negative exam. MRI of the abdomen was suggested for more aggressive approach. Multiple labs were done during his admission, and he was ultimately treated for tonsillitis with a short course of Augmentin. [place] have been reviewed. Viral serologies for EBV, CMV and HIV all negative Blood cultures negative Mycoplasma IgM negative Acute hepatitis panel showed hepatitis B surface antigen positive and hepatitis B core IgM negative and hepatitis C negative . INR 1.19 Iron studies showed an iron of 22 TIBC of 207 and set of 11 ferritin was 1917 hemoglobin 13 hematocrit of 39 Most recent labs done on  12/21/2015-total bili 0.5 alkaline phosphatase 105 AST 1144, ALT 1504  Patient says at this point he is feeling much better and all of his abdominal discomfort has resolved. He did lose weight with onset of his illness but says he has gained about 10 pounds back and is able to eat without any difficulty. Not had any further fever or chills, fatigue has resolved as has his sore throat. Patient is no history of previous liver disease no family history of liver disease, he denies IV drug use. He is married and denies any other sexual contacts. He says he did have multiple transfusions as a teenager with treatment of his lymphoma  Review of Systems Pertinent positive and negative review of systems were noted in the above HPI section.  All other review of systems was otherwise negative.  Outpatient Encounter Prescriptions as of 12/28/2015  Medication Sig  . famotidine (PEPCID) 20 MG tablet Take 1 tablet (20 mg total) by mouth 2 (two) times daily.  . [DISCONTINUED] amoxicillin-clavulanate (AUGMENTIN) 875-125 MG tablet Take 1 tablet by mouth every 12 (twelve) hours.   No facility-administered encounter medications on file as of 12/28/2015.    Allergies  Allergen Reactions  . Phenergan [Promethazine] Other (See Comments)    Hyperactivity and agitation   Patient Active Problem List   Diagnosis Date Noted  . Elevated LFTs   . Epigastric pain   . Abdominal pain 12/09/2015  . LFT elevation 12/09/2015  . Fever, unspecified 12/09/2015  . Hyponatremia 12/09/2015   Social History   Social History  . Marital status:  Married    Spouse name: N/A  . Number of children: N/A  . Years of education: N/A   Occupational History  . Not on file.   Social History Main Topics  . Smoking status: Former Research scientist (life sciences)  . Smokeless tobacco: Never Used  . Alcohol use Yes     Comment: none since admitted to hospital 12/09/15  . Drug use:     Types: Marijuana     Comment: when travels to [place]; now "done" per pt as of  12/28/15  . Sexual activity: Not on file   Other Topics Concern  . Not on file   Social History Narrative  . No narrative on file    Mr. Demo family history includes Heart disease in his mother; Stroke in his paternal grandmother.      Objective:    Vitals:   12/28/15 1359  BP: 120/62  Pulse: 88    Physical Exam  well-developed young African-American male in no acute distress, doesn't pressure 120/62, pulse 88 height 5 foot 6 weight 126. HEENT; nontraumatic normocephalic EOMI PERRLA sclera anicteric, Cardiovascular ;regular rate and rhythm with S1-S2 no murmur rub or gallop, Pulmonary ;clear bilaterally, Abdomen; soft tender nondistended bowel sounds are active there is no palpable mass or hepatosplenomegaly, Rectal; exam not done, Extremities ;no clubbing cyanosis or edema skin warm and dry, Neuropsych; mood and affect appropriate       Assessment & Plan:   #70 35 year old African-American male with acute hepatitis, this may be acute hepatitis B. Will rule out autoimmune hepatitis, and also rule out hepatitis D.  #2 remote history of non-Hodgkin's lymphoma as teenager  Plan; repeat hepatic panel today, check ProTime/INR Check autoimmune hepatic  Markers Hepatitis B DNA quantitative Hepatitis B surface antibody,  we will also repeat hepatitis B surface antigen and hepatitis B core antibody IgM MRI had been scheduled by PCP we'll cancel for now Patient will follow up in our office with Dr. Loletha Carrow  or myself in 2 weeks. Patient relates that he is supposed to leave on September 3 to go out [place] for work for approximately a month. Hopefully we'll have a definite diagnosis and can advise whether this trip should be canceled.   Mozell Haber Genia Harold PA-C 12/28/2015   Cc: [name], [name], MD

## 2015-12-28 NOTE — Progress Notes (Signed)
  This is the plan we discussed in the office today.  Since HBV IgM was neg with rising AST/ALT, he does not appear to have acute HBV. 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 If so, this may be his immune clearance phase.  Agreed rule out Hep D virus and AIH.  Glad to hear he is feeling better, that most likely means his hepatitis is resolving.  He will need follow up either here or wherever he moves.

## 2015-12-28 NOTE — Telephone Encounter (Signed)
Patient was seen by gastro today.

## 2015-12-29 LAB — HEPATITIS B SURFACE ANTIBODY, QUANTITATIVE: Hepatitis B-Post: <5 m[IU]/mL

## 2015-12-29 LAB — ANA: ANA: NEGATIVE

## 2015-12-29 LAB — HEPATITIS B SURFACE ANTIBODY,QUALITATIVE: Hep B S Ab: NEGATIVE

## 2015-12-29 LAB — HEPATITIS B SURFACE ANTIGEN: HEP B S AG: POSITIVE — AB

## 2015-12-29 LAB — MITOCHONDRIAL/SMOOTH MUSCLE AB PNL
Mitochondrial Ab: 7.6 Units (ref 0.0–20.0)
SMOOTH MUSCLE AB: 37 U — AB (ref 0–19)

## 2015-12-29 LAB — HEPATITIS B SURF AG CONFIRMATION: Hepatitis B Surf Ag Confirmation: POSITIVE — AB

## 2015-12-29 LAB — HEPATITIS B CORE ANTIBODY, IGM: HEP B C IGM: NONREACTIVE

## 2015-12-30 LAB — ANTI-SMOOTH MUSCLE ANTIBODY, IGG: Smooth Muscle Ab: 29 U — ABNORMAL HIGH (ref <20)

## 2015-12-31 LAB — HEPATITIS B DNA, ULTRAQUANTITATIVE, PCR
HEPATITIS B DNA (CALC): 6.16 {Log_IU}/mL — AB (ref <1.30)
HEPATITIS B DNA: 1447608 [IU]/mL — AB (ref <20)

## 2016-01-01 LAB — HEPATITIS DELTA VIRUS ANTIGEN: HEPATITIS D ANTIGEN: NOT DETECTED

## 2016-01-03 ENCOUNTER — Other Ambulatory Visit: Payer: Managed Care, Other (non HMO)

## 2016-01-03 ENCOUNTER — Encounter: Payer: Self-pay | Admitting: Family Medicine

## 2016-01-03 ENCOUNTER — Encounter: Payer: Self-pay | Admitting: Physician Assistant

## 2016-01-03 ENCOUNTER — Other Ambulatory Visit (HOSPITAL_COMMUNITY): Payer: Self-pay | Admitting: Internal Medicine

## 2016-01-05 ENCOUNTER — Encounter (HOSPITAL_COMMUNITY): Payer: Self-pay | Admitting: Emergency Medicine

## 2016-01-05 DIAGNOSIS — R1010 Upper abdominal pain, unspecified: Secondary | ICD-10-CM | POA: Diagnosis not present

## 2016-01-05 DIAGNOSIS — Z87891 Personal history of nicotine dependence: Secondary | ICD-10-CM | POA: Diagnosis not present

## 2016-01-05 LAB — COMPREHENSIVE METABOLIC PANEL
ALBUMIN: 3.3 g/dL — AB (ref 3.5–5.0)
ALT: 269 U/L — AB (ref 17–63)
AST: 135 U/L — AB (ref 15–41)
Alkaline Phosphatase: 89 U/L (ref 38–126)
Anion gap: 4 — ABNORMAL LOW (ref 5–15)
BUN: 9 mg/dL (ref 6–20)
CHLORIDE: 106 mmol/L (ref 101–111)
CO2: 24 mmol/L (ref 22–32)
CREATININE: 0.77 mg/dL (ref 0.61–1.24)
Calcium: 8.8 mg/dL — ABNORMAL LOW (ref 8.9–10.3)
GFR calc Af Amer: >60 mL/min (ref >60)
GFR calc non Af Amer: >60 mL/min (ref >60)
Glucose, Bld: 103 mg/dL — ABNORMAL HIGH (ref 65–99)
POTASSIUM: 4 mmol/L (ref 3.5–5.1)
SODIUM: 134 mmol/L — AB (ref 135–145)
Total Bilirubin: 1.4 mg/dL — ABNORMAL HIGH (ref 0.3–1.2)
Total Protein: 7.7 g/dL (ref 6.5–8.1)

## 2016-01-05 LAB — CBC
HEMATOCRIT: 40.8 % (ref 39.0–52.0)
Hemoglobin: 13.7 g/dL (ref 13.0–17.0)
MCH: 28.4 pg (ref 26.0–34.0)
MCHC: 33.6 g/dL (ref 30.0–36.0)
MCV: 84.6 fL (ref 78.0–100.0)
PLATELETS: 146 10*3/uL — AB (ref 150–400)
RBC: 4.82 MIL/uL (ref 4.22–5.81)
RDW: 14.8 % (ref 11.5–15.5)
WBC: 7.9 10*3/uL (ref 4.0–10.5)

## 2016-01-05 LAB — URINALYSIS, ROUTINE W REFLEX MICROSCOPIC
Bilirubin Urine: NEGATIVE
GLUCOSE, UA: NEGATIVE mg/dL
HGB URINE DIPSTICK: NEGATIVE
Ketones, ur: NEGATIVE mg/dL
LEUKOCYTES UA: NEGATIVE
Nitrite: NEGATIVE
PH: 8 (ref 5.0–8.0)
PROTEIN: NEGATIVE mg/dL
Specific Gravity, Urine: 1.022 (ref 1.005–1.030)

## 2016-01-05 LAB — LIPASE, BLOOD: LIPASE: 32 U/L (ref 11–51)

## 2016-01-05 NOTE — ED Triage Notes (Signed)
Pt. reports chronic  mid/LUQ abdominal pain with occasional emesis for > 1 month , denies fever /no diarrhea .

## 2016-01-06 ENCOUNTER — Emergency Department (HOSPITAL_COMMUNITY)
Admission: EM | Admit: 2016-01-06 | Discharge: 2016-01-06 | Disposition: A | Discharge status: Home / Self Care | Payer: Managed Care, Other (non HMO) | Attending: Emergency Medicine | Admitting: Emergency Medicine

## 2016-01-06 DIAGNOSIS — R109 Unspecified abdominal pain: Secondary | ICD-10-CM

## 2016-01-06 MED ORDER — OXYCODONE HCL 5 MG PO TABS: 5.0000 mg | ORAL_TABLET | ORAL | 0 refills | Status: DC | PRN

## 2016-01-06 MED ORDER — ONDANSETRON 4 MG PO TBDP: 4.0000 mg | ORAL_TABLET | Freq: Three times a day (TID) | ORAL | 0 refills | Status: DC | PRN

## 2016-01-06 MED ORDER — OXYCODONE HCL 5 MG PO TABS
5.0000 mg | ORAL_TABLET | Freq: Once | ORAL | Status: AC
Start: 2016-01-06 — End: 2016-01-06
  Administered 2016-01-06: 5 mg via ORAL
  Filled 2016-01-06: qty 1

## 2016-01-06 NOTE — ED Notes (Signed)
Family asking how long is the wait x 3 given

## 2016-01-06 NOTE — ED Provider Notes (Signed)
Maurice Lowe DEPT Provider Note   CSN: [id number] Arrival date & time: 01/05/16  2018     History   Chief Complaint Chief Complaint  Patient presents with  . Abdominal Pain    HPI Maurice Lowe is a 35 y.o. male.  The history is provided by the patient, a parent and medical records.  Abdominal Pain     35 y.o. M with hx of newly diagnosed Hep B, non-hodgkin's lymphoma in remission for approx 20 years, presenting to the ED for abdominal pain.  Patient states this has been ongoing for the past several months.  States he was admitted last month due to elevation of LFT's and was diagnosed with hep B.  States since this time he has had nearly constant pain in his upper abdomen that waxes and wanes in severity.  Endorses intermittent nausea but denies vomiting.  BM's have been normal.  No fever, chills, sweats.  No GERD type symptoms.  States he is scheduled to see ID and GI later this month for ongoing care.  States he has avoided alcohol and beer recently as he was instructed by hospital staff.  No prior abdominal surgeries.  Patient states current pain is mild, rates it a 4/10.  States he feels hungry and wants to eat.  No chest pain or SOB.  No meds tried PTA.  Past Medical History:  Diagnosis Date  . Non Hodgkin's lymphoma [facility])    age [age]    Patient Active Problem List   Diagnosis Date Noted  . Elevated LFTs   . Epigastric pain   . Abdominal pain 12/09/2015  . LFT elevation 12/09/2015  . Fever, unspecified 12/09/2015  . Hyponatremia 12/09/2015    Past Surgical History:  Procedure Laterality Date  . PORT-A-CATH REMOVAL    . PORTACATH PLACEMENT         Home Medications    Prior to Admission medications   Medication Sig Start Date End Date Taking? Authorizing Provider  famotidine (PEPCID) 20 MG tablet Take 1 tablet (20 mg total) by mouth 2 (two) times daily. 12/12/15   Reyne Dumas, MD    Family History Family History  Problem Relation Age of Onset  .  Heart disease Mother   . Stroke Paternal Grandmother   . Hepatitis Neg Hx   . Liver disease Neg Hx   . Colon cancer Neg Hx   . Esophageal cancer Neg Hx   . Stomach cancer Neg Hx   . Pancreatic cancer Neg Hx   . Colon polyps Neg Hx     Social History Social History  Substance Use Topics  . Smoking status: Former Research scientist (life sciences)  . Smokeless tobacco: Never Used  . Alcohol use Yes     Comment: none since admitted to hospital 12/09/15     Allergies   Phenergan [promethazine]   Review of Systems Review of Systems  Gastrointestinal: Positive for abdominal pain.  All other systems reviewed and are negative.    Physical Exam Updated Vital Signs BP 121/79 (BP Location: Right Arm)   Pulse 82   Temp 98.4 F (36.9 C) (Oral)   Resp 16   Ht 5\' 6"  (1.676 m)   Wt 55.1 kg   SpO2 99%   BMI 19.62 kg/m   Physical Exam  Constitutional: He is oriented to person, place, and time. He appears well-developed and well-nourished.  HENT:  Head: Normocephalic and atraumatic.  Mouth/Throat: Oropharynx is clear and moist.  Eyes: Conjunctivae and EOM are normal. Pupils are  equal, round, and reactive to light.  Neck: Normal range of motion.  Cardiovascular: Normal rate, regular rhythm and normal heart sounds.   Pulmonary/Chest: Effort normal and breath sounds normal.  Abdominal: Soft. Bowel sounds are normal. He exhibits no mass. There is no hepatomegaly. There is no tenderness. There is no rebound and no guarding.  Musculoskeletal: Normal range of motion.  Neurological: He is alert and oriented to person, place, and time.  Skin: Skin is warm and dry.  Psychiatric: He has a normal mood and affect.  Nursing note and vitals reviewed.    ED Treatments / Results  Labs (all labs ordered are listed, but only abnormal results are displayed) Labs Reviewed  COMPREHENSIVE METABOLIC PANEL - Abnormal; Notable for the following:       Result Value   Sodium 134 (*)    Glucose, Bld 103 (*)    Calcium 8.8  (*)    Albumin 3.3 (*)    AST 135 (*)    ALT 269 (*)    Total Bilirubin 1.4 (*)    Anion gap 4 (*)    All other components within normal limits  CBC - Abnormal; Notable for the following:    Platelets 146 (*)    All other components within normal limits  LIPASE, BLOOD  URINALYSIS, ROUTINE W REFLEX MICROSCOPIC (NOT AT [facility])    EKG  EKG Interpretation None       Radiology No results found.  Procedures Procedures (including critical care time)  Medications Ordered in ED Medications  oxyCODONE (Oxy IR/ROXICODONE) immediate release tablet 5 mg (not administered)     Initial Impression / Assessment and Plan / ED Course  I have reviewed the triage vital signs and the nursing notes.  Pertinent labs & imaging results that were available during my care of the patient were reviewed by me and considered in my medical decision making (see chart for details).  Clinical Course   35 year old male here with abdominal pain has been ongoing for the past month after admission and diagnosis of hepatitis B. States currently pain is mild, rated 4/10.  He is requesting to eat and drink as he feels hungry at this time. He is afebrile and nontoxic. His abdominal exam is benign. He has no hepatomegaly or masses. His bowel sounds are normal. Lab work overall improved from last month, his LFTs seem to be trending down. His lipase is normal. I have reviewed his prior CTs from last month, he had no evidence of gallstones at that time and he has no right upper quadrant pain currently. At this time, do not suspect acute/surgical abdomen.  Patient treated here with low dose oxycodone, improvement of pain.  Able to eat sandwich without recurrent pain.  No nausea/vomiting.  Feel he is stable for discharge, states he is comfortable going home.  He has previously scheduled follow-up with ID and GI later this month.  Discussed plan with patient, he acknowledged understanding and agreed with plan of care.  Return  precautions given for new or worsening symptoms.  Final Clinical Impressions(s) / ED Diagnoses   Final diagnoses:  Abdominal pain, unspecified abdominal location    New Prescriptions New Prescriptions   ONDANSETRON (ZOFRAN ODT) 4 MG DISINTEGRATING TABLET    Take 1 tablet (4 mg total) by mouth every 8 (eight) hours as needed for nausea.   OXYCODONE (OXY IR/ROXICODONE) 5 MG IMMEDIATE RELEASE TABLET    Take 1 tablet (5 mg total) by mouth every 4 (four) hours as  needed for severe pain.     Larene Pickett, PA-C 01/06/16 [id number]    Merryl Hacker, MD 01/06/16 [phone]

## 2016-01-06 NOTE — Telephone Encounter (Signed)
Emails reviewed Chart reviewed. Patient has been seen in the ER last night/this morning Was discharged. Seen by Nicoletta Ba, PA-C with plans to follow-up with her and/or Dr. Loletha Carrow.  Recent acute hepatitis B. Transaminases are down trending, which is reassuring. Liver enzymes and INR need to be followed to ensure improvement. Expect he will seroconvert surface antibody over the next few weeks to months. Patient should have follow-up with Dr. Loletha Carrow scheduled, if not already

## 2016-01-06 NOTE — Discharge Instructions (Signed)
Take the prescribed medication as directed. Use caution, this can make you sleepy/drowsy. Continue to avoid alcohol and Tylenol as these can worsen your liver function. Recommend you follow-up with your primary care doctor. Also recommend that you keep your infectious disease and gastroenterology appointment for later this month as scheduled. Return here for any new/worsening symptoms.

## 2016-01-06 NOTE — ED Notes (Signed)
Pt reports eating a nectarine without nausea or discomfort

## 2016-01-12 ENCOUNTER — Other Ambulatory Visit: Payer: Self-pay

## 2016-01-12 ENCOUNTER — Other Ambulatory Visit: Payer: Managed Care, Other (non HMO)

## 2016-01-12 DIAGNOSIS — R7989 Other specified abnormal findings of blood chemistry: Secondary | ICD-10-CM

## 2016-01-12 DIAGNOSIS — B179 Acute viral hepatitis, unspecified: Secondary | ICD-10-CM

## 2016-01-12 DIAGNOSIS — B169 Acute hepatitis B without delta-agent and without hepatic coma: Secondary | ICD-10-CM

## 2016-01-12 DIAGNOSIS — R945 Abnormal results of liver function studies: Secondary | ICD-10-CM

## 2016-01-13 ENCOUNTER — Ambulatory Visit: Payer: Managed Care, Other (non HMO) | Admitting: Physician Assistant

## 2016-01-13 LAB — MITOCHONDRIAL/SMOOTH MUSCLE AB PNL
Mitochondrial Ab: 6.5 Units (ref 0.0–20.0)
SMOOTH MUSCLE AB: 31 U — AB (ref 0–19)

## 2016-01-17 ENCOUNTER — Ambulatory Visit: Payer: Managed Care, Other (non HMO) | Admitting: Family Medicine

## 2016-01-17 DIAGNOSIS — Z0289 Encounter for other administrative examinations: Secondary | ICD-10-CM

## 2016-01-18 ENCOUNTER — Ambulatory Visit: Payer: Managed Care, Other (non HMO) | Admitting: Physician Assistant

## 2016-01-20 ENCOUNTER — Ambulatory Visit (INDEPENDENT_AMBULATORY_CARE_PROVIDER_SITE_OTHER): Payer: Managed Care, Other (non HMO) | Admitting: Physician Assistant

## 2016-01-20 ENCOUNTER — Encounter: Payer: Self-pay | Admitting: Physician Assistant

## 2016-01-20 ENCOUNTER — Other Ambulatory Visit (INDEPENDENT_AMBULATORY_CARE_PROVIDER_SITE_OTHER): Payer: Managed Care, Other (non HMO)

## 2016-01-20 VITALS — BP 120/70 | HR 76 | Ht 66.0 in | Wt 131.2 lb

## 2016-01-20 DIAGNOSIS — R7989 Other specified abnormal findings of blood chemistry: Secondary | ICD-10-CM | POA: Diagnosis not present

## 2016-01-20 DIAGNOSIS — B169 Acute hepatitis B without delta-agent and without hepatic coma: Secondary | ICD-10-CM

## 2016-01-20 DIAGNOSIS — B179 Acute viral hepatitis, unspecified: Secondary | ICD-10-CM

## 2016-01-20 DIAGNOSIS — B181 Chronic viral hepatitis B without delta-agent: Secondary | ICD-10-CM

## 2016-01-20 DIAGNOSIS — K7200 Acute and subacute hepatic failure without coma: Secondary | ICD-10-CM | POA: Diagnosis not present

## 2016-01-20 DIAGNOSIS — R945 Abnormal results of liver function studies: Secondary | ICD-10-CM

## 2016-01-20 LAB — HEPATIC FUNCTION PANEL
ALBUMIN: 3.2 g/dL — AB (ref 3.5–5.2)
ALT: 75 U/L — ABNORMAL HIGH (ref 0–53)
AST: 49 U/L — AB (ref 0–37)
Alkaline Phosphatase: 70 U/L (ref 39–117)
BILIRUBIN TOTAL: 0.4 mg/dL (ref 0.2–1.2)
Bilirubin, Direct: 0 mg/dL (ref 0.0–0.3)
Total Protein: 7.2 g/dL (ref 6.0–8.3)

## 2016-01-20 NOTE — Patient Instructions (Signed)
Please go to the basement level to have your labs drawn.  

## 2016-01-20 NOTE — Progress Notes (Signed)
Thank yo for having me see this patient in clinic today.  Repeat DNA and LFTs 6 months out from original Dx will tell us whether he has passed through immune clearance or remains chronic and needs antiviral treatment. Please be sure he follows in 2-3 months.

## 2016-01-20 NOTE — Progress Notes (Signed)
Subjective:    Patient ID: Maurice Lowe, male    DOB: 04-22-1981, 35 y.o.   MRN: [id number]  HPI Morrison is a pleasant 35 year old African-American male who was seen in consult on 12/28/2015, referred by Dr. Betty [name] with an acute hepatitis. He had actually been hospitalized on 12/09/2015 complaints of fatigue and upper abdominal pain and had been feeling poorly for about a week prior to that admission. He also had low-grade fever sore throat and upper abdominal pain. He was febrile on admission with markedly elevated LFTs. Multiple labs were done during that admission mother he was ultimately treated for tonsillitis and a short course of Augmentin. Acute hepatitis panel showed positive hepatitis B surface antigen, hepatitis B core IgM was negative and hepatitis C negative. When he was seen in the office he was feeling much better, all of his abdominal discomfort had resolved. He did lose about 10 pounds with this acute episode but had been eating much better. He had not had any further fever or chills. Patient has no history of IV drug use, he is married. He did relate having multiple transfusions as a teenager in the 1990s when he was treated for non-Hodgkin's lymphoma. He had never been told that he had hepatitis in the past. Additional labs have been done and he returns today for follow-up. He says he is feeling well. Appetite is good he has no complaints of abdominal pain other than occasionally getting a mild discomfort in his upper abdomen. Energy level is good. Additional labs obtained at last office visit include hepatitis B surface antibody negative, hepatitis B DNA positive at a level of 1, 477, 608. Hepatitis D antigen negative, ANA negative smooth muscle antibody and mitochondrial antibody negative. He was to come back to the office for hepatitis B e antigen and antibody but those have not been drawn as yet.  Review of Systems Pertinent positive and negative review of systems  were noted in the above HPI section.  All other review of systems was otherwise negative.  Outpatient Encounter Prescriptions as of 01/20/2016  Medication Sig  . oxyCODONE (OXY IR/ROXICODONE) 5 MG immediate release tablet Take 1 tablet (5 mg total) by mouth every 4 (four) hours as needed for severe pain. (Patient not taking: Reported on 01/20/2016)  . [DISCONTINUED] famotidine (PEPCID) 20 MG tablet Take 1 tablet (20 mg total) by mouth 2 (two) times daily.  . [DISCONTINUED] ondansetron (ZOFRAN ODT) 4 MG disintegrating tablet Take 1 tablet (4 mg total) by mouth every 8 (eight) hours as needed for nausea.   No facility-administered encounter medications on file as of 01/20/2016.    Allergies  Allergen Reactions  . Phenergan [Promethazine] Other (See Comments)    Hyperactivity and agitation   Patient Active Problem List   Diagnosis Date Noted  . Elevated LFTs   . Epigastric pain   . Abdominal pain 12/09/2015  . LFT elevation 12/09/2015  . Fever, unspecified 12/09/2015  . Hyponatremia 12/09/2015   Social History   Social History  . Marital status: Married    Spouse name: N/A  . Number of children: N/A  . Years of education: N/A   Occupational History  . Not on file.   Social History Main Topics  . Smoking status: Former Research scientist (life sciences)  . Smokeless tobacco: Never Used  . Alcohol use Yes     Comment: none since admitted to hospital 12/09/15  . Drug use:     Types: Marijuana     Comment:  when travels to [place]; now "done" per pt as of 12/28/15  . Sexual activity: Not on file   Other Topics Concern  . Not on file   Social History Narrative  . No narrative on file    Mr. Rosenboom family history includes Heart disease in his mother; Stroke in his paternal grandmother.      Objective:    Vitals:   01/20/16 1333  BP: 120/70  Pulse: 76    Physical Exam well-developed young African-American male in no acute distress, pleasant blood pressure 120/70 pulse 76 weight 131 height 5 foot 6  BMI 21.1. HEENT; nontraumatic, cephalic EOMI PERRLA sclera anicteric, Cardiovascular; regular rate and rhythm with S1-S2 no murmur or gallop, Pulmonary ;clear bilaterally abdomen soft basically nontender there is no palpable mass or hepatosplenomegaly bowel sounds are present, Extremities; no clubbing cyanosis or edema skin warm and dry, Neuropsych ;mood and affect appropriate       Assessment & Plan:   #37 35 year old African-American male with what appears to be chronic active  hepatitis B. With recent acute febrile illness, fatigue jaundice etc. it is not clear whether he has had an exacerbation of chronic hepatitis B or perhaps is in an immune clearance phase. He currently feels well and most recent LFTs were much improved with AST 135 ALT of 269.  #2 remote history of non-Hodgkin's lymphoma #3 hepatic cysts noted on CT 12/09/2015  Plan; discussion today regarding probable chronic hepatitis B and current infectivity. Patient was advised to use protection for all sexual encounters, and advised that his wife should be tested for hepatitis B and if negative then immunized. Dr. Loletha Carrow saw this patient with me today, and discussed need to monitor  over the next several months to see whether he is in a clearance phase and will clear the virus, or whether this was an acute exacerbation and he will continue to have chronic active hepatitis B which were require treatment. Patient voices understanding. We will check hepatic panel today and hepatitis B eantigen and e antibody. We will plan to see him back in the office in 2-3 months, and we'll follow serial labs.  Joselynne Killam Genia Harold PA-C 01/20/2016   Cc: [name], [name], MD

## 2016-01-25 LAB — HEPATITIS B E ANTIBODY: Hepatitis Be Antibody: NONREACTIVE

## 2016-01-25 LAB — HEPATITIS B E ANTIGEN: HEPATITIS BE ANTIGEN: NONREACTIVE

## 2016-01-31 ENCOUNTER — Other Ambulatory Visit: Payer: Self-pay

## 2016-01-31 DIAGNOSIS — R945 Abnormal results of liver function studies: Secondary | ICD-10-CM

## 2016-12-05 ENCOUNTER — Ambulatory Visit (HOSPITAL_COMMUNITY)
Admission: EM | Admit: 2016-12-05 | Discharge: 2016-12-05 | Disposition: A | Discharge status: Nursing Home (Includes ICF) | Payer: Managed Care, Other (non HMO) | Attending: Internal Medicine | Admitting: Internal Medicine

## 2016-12-05 ENCOUNTER — Emergency Department (HOSPITAL_COMMUNITY): Payer: Managed Care, Other (non HMO)

## 2016-12-05 ENCOUNTER — Encounter (HOSPITAL_COMMUNITY): Payer: Self-pay

## 2016-12-05 ENCOUNTER — Encounter (HOSPITAL_COMMUNITY): Payer: Self-pay | Admitting: Emergency Medicine

## 2016-12-05 ENCOUNTER — Emergency Department (HOSPITAL_COMMUNITY)
Admission: EM | Admit: 2016-12-05 | Discharge: 2016-12-05 | Disposition: A | Discharge status: Home / Self Care | Payer: Managed Care, Other (non HMO) | Attending: Emergency Medicine | Admitting: Emergency Medicine

## 2016-12-05 DIAGNOSIS — R51 Headache: Secondary | ICD-10-CM

## 2016-12-05 DIAGNOSIS — Z87891 Personal history of nicotine dependence: Secondary | ICD-10-CM | POA: Diagnosis not present

## 2016-12-05 DIAGNOSIS — R1011 Right upper quadrant pain: Secondary | ICD-10-CM | POA: Diagnosis not present

## 2016-12-05 DIAGNOSIS — K7689 Other specified diseases of liver: Secondary | ICD-10-CM | POA: Insufficient documentation

## 2016-12-05 DIAGNOSIS — R945 Abnormal results of liver function studies: Secondary | ICD-10-CM

## 2016-12-05 DIAGNOSIS — R7989 Other specified abnormal findings of blood chemistry: Secondary | ICD-10-CM | POA: Insufficient documentation

## 2016-12-05 DIAGNOSIS — C8590 Non-Hodgkin lymphoma, unspecified, unspecified site: Secondary | ICD-10-CM | POA: Insufficient documentation

## 2016-12-05 DIAGNOSIS — R630 Anorexia: Secondary | ICD-10-CM

## 2016-12-05 DIAGNOSIS — R519 Headache, unspecified: Secondary | ICD-10-CM

## 2016-12-05 LAB — CBC
HEMATOCRIT: 40.9 % (ref 39.0–52.0)
HEMOGLOBIN: 13.3 g/dL (ref 13.0–17.0)
MCH: 27.4 pg (ref 26.0–34.0)
MCHC: 32.5 g/dL (ref 30.0–36.0)
MCV: 84.2 fL (ref 78.0–100.0)
Platelets: 135 10*3/uL — ABNORMAL LOW (ref 150–400)
RBC: 4.86 MIL/uL (ref 4.22–5.81)
RDW: 13.9 % (ref 11.5–15.5)
WBC: 4.8 10*3/uL (ref 4.0–10.5)

## 2016-12-05 LAB — COMPREHENSIVE METABOLIC PANEL
ALBUMIN: 4 g/dL (ref 3.5–5.0)
ALK PHOS: 86 U/L (ref 38–126)
ALT: 197 U/L — ABNORMAL HIGH (ref 17–63)
ANION GAP: 9 (ref 5–15)
AST: 107 U/L — ABNORMAL HIGH (ref 15–41)
BILIRUBIN TOTAL: 1 mg/dL (ref 0.3–1.2)
BUN: 8 mg/dL (ref 6–20)
CALCIUM: 8.9 mg/dL (ref 8.9–10.3)
CO2: 24 mmol/L (ref 22–32)
Chloride: 103 mmol/L (ref 101–111)
Creatinine, Ser: 0.84 mg/dL (ref 0.61–1.24)
GLUCOSE: 89 mg/dL (ref 65–99)
POTASSIUM: 3.7 mmol/L (ref 3.5–5.1)
Sodium: 136 mmol/L (ref 135–145)
TOTAL PROTEIN: 7.4 g/dL (ref 6.5–8.1)

## 2016-12-05 LAB — URINALYSIS, ROUTINE W REFLEX MICROSCOPIC
BILIRUBIN URINE: NEGATIVE
Glucose, UA: NEGATIVE mg/dL
Hgb urine dipstick: NEGATIVE
Ketones, ur: NEGATIVE mg/dL
LEUKOCYTES UA: NEGATIVE
NITRITE: NEGATIVE
PH: 6 (ref 5.0–8.0)
Protein, ur: NEGATIVE mg/dL

## 2016-12-05 LAB — MONONUCLEOSIS SCREEN: MONO SCREEN: NEGATIVE

## 2016-12-05 LAB — LIPASE, BLOOD: Lipase: 26 U/L (ref 11–51)

## 2016-12-05 MED ORDER — SODIUM CHLORIDE 0.9 % IV BOLUS (SEPSIS)
1000.0000 mL | Freq: Once | INTRAVENOUS | Status: AC
  Administered 2016-12-05: 1000 mL via INTRAVENOUS

## 2016-12-05 MED ORDER — MORPHINE SULFATE (PF) 4 MG/ML IV SOLN
4.0000 mg | Freq: Once | INTRAVENOUS | Status: AC
  Administered 2016-12-05: 4 mg via INTRAVENOUS
  Filled 2016-12-05: qty 1

## 2016-12-05 MED ORDER — ONDANSETRON HCL 4 MG/2ML IJ SOLN
4.0000 mg | Freq: Once | INTRAMUSCULAR | Status: AC
  Administered 2016-12-05: 4 mg via INTRAVENOUS
  Filled 2016-12-05: qty 2

## 2016-12-05 MED ORDER — ONDANSETRON 4 MG PO TBDP: ORAL_TABLET | ORAL | 0 refills | Status: DC

## 2016-12-05 MED ORDER — IOPAMIDOL (ISOVUE-300) INJECTION 61%
INTRAVENOUS | Status: AC
Start: 2016-12-05 — End: 2016-12-05
  Administered 2016-12-05: 100 mL
  Filled 2016-12-05: qty 100

## 2016-12-05 MED ORDER — OXYCODONE HCL 5 MG PO CAPS: 5.0000 mg | ORAL_CAPSULE | Freq: Four times a day (QID) | ORAL | 0 refills | Status: DC | PRN

## 2016-12-05 NOTE — ED Provider Notes (Signed)
[facility] DEPT Provider Note   CSN: [id number] Arrival date & time: 12/05/16  1444     History   Chief Complaint Chief Complaint  Patient presents with  . Abdominal Pain    HPI Maurice Lowe is a 36 y.o. male history of non-Hodgkin's lymphoma, hepatitis B here presenting with right upper quadrant. Patient states that he has been having right upper quadrant pain and yesterday. Patient denies any vomiting or diarrhea. Denies any fevers or chills. Patient did eat some hamburgers about 2 weeks ago that is not fully cooked. Patient does have a history of non-Hodgkin's lymphoma that was treated. Patient denies any recent travel. Patient was admitted about a year ago for elevated liver function tests. Patient eventually sought GI doctor and Hepatitis B surface antibody was positive and patient had extensive autoimmune workup but is not currently on any medicines for it.   The history is provided by the patient.    Past Medical History:  Diagnosis Date  . Hepatitis   . Non Hodgkin's lymphoma [facility])    age [age]    Patient Active Problem List   Diagnosis Date Noted  . Elevated LFTs   . Epigastric pain   . Abdominal pain 12/09/2015  . LFT elevation 12/09/2015  . Fever, unspecified 12/09/2015  . Hyponatremia 12/09/2015    Past Surgical History:  Procedure Laterality Date  . PORT-A-CATH REMOVAL    . PORTACATH PLACEMENT         Home Medications    Prior to Admission medications   Medication Sig Start Date End Date Taking? Authorizing Provider  oxyCODONE (OXY IR/ROXICODONE) 5 MG immediate release tablet Take 1 tablet (5 mg total) by mouth every 4 (four) hours as needed for severe pain. Patient not taking: Reported on 01/20/2016 01/06/16   Larene Pickett, PA-C    Family History Family History  Problem Relation Age of Onset  . Heart disease Mother   . Stroke Paternal Grandmother   . Hepatitis Neg Hx   . Liver disease Neg Hx   . Colon cancer Neg Hx   .  Esophageal cancer Neg Hx   . Stomach cancer Neg Hx   . Pancreatic cancer Neg Hx   . Colon polyps Neg Hx     Social History Social History  Substance Use Topics  . Smoking status: Former Research scientist (life sciences)  . Smokeless tobacco: Never Used  . Alcohol use Yes     Comment: none since admitted to hospital 12/09/15     Allergies   Phenergan [promethazine]   Review of Systems Review of Systems  Gastrointestinal: Positive for abdominal pain.  All other systems reviewed and are negative.    Physical Exam Updated Vital Signs BP 130/79 (BP Location: Left Arm)   Pulse 70   Temp 98.1 F (36.7 C) (Oral)   Resp 16   SpO2 99%   Physical Exam  Constitutional: He is oriented to person, place, and time. He appears well-developed.  HENT:  Head: Normocephalic.  Mouth/Throat: Oropharynx is clear and moist.  Eyes: Pupils are equal, round, and reactive to light. Conjunctivae and EOM are normal.  Neck: Normal range of motion. Neck supple.  Cardiovascular: Normal rate, regular rhythm and normal heart sounds.   Pulmonary/Chest: Effort normal and breath sounds normal. No respiratory distress. He has no wheezes.  Abdominal: Soft. Bowel sounds are normal.  Mild RUQ tenderness, no murphy's.   Musculoskeletal: Normal range of motion.  Neurological: He is alert and oriented to person, place, and time.  No cranial nerve deficit. Coordination normal.  Skin: Skin is warm.  Psychiatric: He has a normal mood and affect.  Nursing note and vitals reviewed.    ED Treatments / Results  Labs (all labs ordered are listed, but only abnormal results are displayed) Labs Reviewed  COMPREHENSIVE METABOLIC PANEL - Abnormal; Notable for the following:       Result Value   AST 107 (*)    ALT 197 (*)    All other components within normal limits  CBC - Abnormal; Notable for the following:    Platelets 135 (*)    All other components within normal limits  LIPASE, BLOOD  URINALYSIS, ROUTINE W REFLEX MICROSCOPIC    HEPATITIS PANEL, ACUTE  MONONUCLEOSIS SCREEN    EKG  EKG Interpretation None       Radiology US Abdomen Limited Ruq  Result Date: 12/05/2016 CLINICAL DATA:  Right upper quadrant pain EXAM: ULTRASOUND ABDOMEN LIMITED RIGHT UPPER QUADRANT COMPARISON:  12/09/2015 FINDINGS: Gallbladder: No gallstones or wall thickening visualized. No sonographic Murphy sign noted by sonographer. Common bile duct: Diameter: 3 mm Liver: There are relative hypoechoic area is identified centrally within the left lobe. These correspond to a vague areas of decreased attenuation adjacent to the previously diagnosed cysts. The largest of these measures approximately 1.7 cm in dimension. IMPRESSION: Vague hypoechoic areas which may represent complicated cysts although are different than that visualized on prior CT examination. Nonemergent MRI is recommended for further evaluation. Electronically Signed   By: Inez Catalina M.D.   On: 12/05/2016 19:38    Procedures Procedures (including critical care time)  Medications Ordered in ED Medications  sodium chloride 0.9 % bolus 1,000 mL (1,000 mLs Intravenous New Bag/Given 12/05/16 1907)  ondansetron (ZOFRAN) injection 4 mg (4 mg Intravenous Given 12/05/16 1907)  morphine 4 MG/ML injection 4 mg (4 mg Intravenous Given 12/05/16 1906)     Initial Impression / Assessment and Plan / ED Course  I have reviewed the triage vital signs and the nursing notes.  Pertinent labs & imaging results that were available during my care of the patient were reviewed by me and considered in my medical decision making (see chart for details).    Maurice Lowe is a 36 y.o. male here with RUQ pain. Hx of non Hodgkin's lymphoma and previous hep B infection vs immunized. Will get labs, LFTs, RUQ [place]. Will get hepatitis panel.   8:04 PM RUQ US showed complex liver cysts. Given hx of lymphoma, will get CT ab/pel. AST 100, ALT 197 in the ED.   10:26 PM CT showed possible cyst with  hemorrhage. Pain controlled. No vomiting in the ED. Tolerated PO fluids. Called Dr. Hilarie Fredrickson from [facility]. He will follow up hepatitis panel, doesn't want further workup in the ED. Can get outpatient MRI as per GI. Will dc home with zofran prn, oxycodone. Will have him avoid tylenol products    Final Clinical Impressions(s) / ED Diagnoses   Final diagnoses:  RUQ pain    New Prescriptions New Prescriptions   No medications on file     Drenda Freeze, MD 12/05/16 2228

## 2016-12-05 NOTE — ED Notes (Signed)
Pt returns from CT.

## 2016-12-05 NOTE — ED Notes (Signed)
Patient transported to CT 

## 2016-12-05 NOTE — ED Triage Notes (Signed)
Patient has abdominal pain that started yesterday evening.  Patient reports loss of appetite today.  Frequent headache for the past 2 weeks.  Denies nausea, vomiting or diarrhea

## 2016-12-05 NOTE — Discharge Instructions (Signed)
Your liver enzymes are slightly elevated.   Take zofran for nausea, stay hydrated.   Take motrin as needed for pain.   Take oxycodone for severe pain.   Call GI office for appointment tomorrow. They will follow up the hepatitis panel.   Return to ER if you have fever, severe pain, vomiting, dehydration.

## 2016-12-05 NOTE — ED Notes (Signed)
Patient transported to Ultrasound 

## 2016-12-05 NOTE — ED Provider Notes (Signed)
CSN: [id number]     Arrival date & time 12/05/16  1339 History   First MD Initiated Contact with Patient 12/05/16 1414     Chief Complaint  Patient presents with  . Abdominal Pain   (Consider location/radiation/quality/duration/timing/severity/associated sxs/prior Treatment) HPI  Maurice Lowe is a 36 y.o. male presenting to UC with c/o generalized abdominal pain that is worse in RUQ that started yesterday evening. Associated loss of appetite. Hx of hepatitis.  Denies fever, chills, n/v/d but has had intermittent generalized headaches for about 2 weeks. He has been taking ibuprofen for his headaches which does help.   Past Medical History:  Diagnosis Date  . Hepatitis   . Non Hodgkin's lymphoma [facility])    age [age]   Past Surgical History:  Procedure Laterality Date  . PORT-A-CATH REMOVAL    . PORTACATH PLACEMENT     Family History  Problem Relation Age of Onset  . Heart disease Mother   . Stroke Paternal Grandmother   . Hepatitis Neg Hx   . Liver disease Neg Hx   . Colon cancer Neg Hx   . Esophageal cancer Neg Hx   . Stomach cancer Neg Hx   . Pancreatic cancer Neg Hx   . Colon polyps Neg Hx    Social History  Substance Use Topics  . Smoking status: Former Research scientist (life sciences)  . Smokeless tobacco: Never Used  . Alcohol use Yes     Comment: none since admitted to hospital 12/09/15    Review of Systems  Constitutional: Negative for chills and fever.  HENT: Negative for congestion, ear pain, sore throat, trouble swallowing and voice change.   Respiratory: Negative for cough and shortness of breath.   Cardiovascular: Negative for chest pain and palpitations.  Gastrointestinal: Positive for abdominal pain. Negative for diarrhea, nausea and vomiting.  Musculoskeletal: Negative for arthralgias, back pain and myalgias.  Skin: Negative for rash.  Neurological: Positive for headaches. Negative for dizziness and light-headedness.    Allergies  Phenergan [promethazine]  Home  Medications   Prior to Admission medications   Medication Sig Start Date End Date Taking? Authorizing Provider  oxyCODONE (OXY IR/ROXICODONE) 5 MG immediate release tablet Take 1 tablet (5 mg total) by mouth every 4 (four) hours as needed for severe pain. Patient not taking: Reported on 01/20/2016 01/06/16   Maurice Pickett, PA-C   Meds Ordered and Administered this Visit  Medications - No data to display  BP 127/83 (BP Location: Left Arm)   Pulse 73   Temp 98.2 F (36.8 C) (Oral)   Resp 16   SpO2 98%  No data found.   Physical Exam  Constitutional: He is oriented to person, place, and time. He appears well-developed and well-nourished. No distress.  Pt lying on exam bed, does not appear to feel well but is alert and cooperative.  HENT:  Head: Normocephalic and atraumatic.  Mouth/Throat: Oropharynx is clear and moist.  Eyes: EOM are normal.  Neck: Normal range of motion.  Cardiovascular: Normal rate and regular rhythm.   Pulmonary/Chest: Effort normal and breath sounds normal. No respiratory distress. He has no wheezes. He has no rales.  Abdominal: Soft. He exhibits no distension and no mass. There is tenderness ( RUQ). There is guarding. There is no rebound.  Musculoskeletal: Normal range of motion.  Neurological: He is alert and oriented to person, place, and time.  Skin: Skin is warm and dry. He is not diaphoretic.  Psychiatric: He has a normal mood and affect. His  behavior is normal.  Nursing note and vitals reviewed.   Urgent Care Course     Procedures (including critical care time)  Labs Review Labs Reviewed - No data to display  Imaging Review No results found.    MDM   1. RUQ abdominal pain   2. Loss of appetite   3. Generalized headaches    RUQ abdominal pain, loss of appetite. Still has gallbladder. Hx of hepatitis.   Recommend going to ED for further evaluations with labs, and possible imaging.   Pt agreeable with tx plan.     Maurice Lowe,  [place] 12/05/16 1436

## 2016-12-05 NOTE — ED Notes (Signed)
Pt stable, ambulatory, states understanding of discharge instructions 

## 2016-12-05 NOTE — ED Triage Notes (Signed)
Pt here with c/o RUQ abdominal pain that started yesterday. He states last time he had pain like this he was told he had hep B. He denies n/v/d. Pain with movement and palpation.

## 2016-12-08 LAB — HEPATITIS PANEL, ACUTE
HCV Ab: <0.1 s/co ratio (ref 0.0–0.9)
HEP A IGM: NEGATIVE
HEP B S AG: POSITIVE — AB
Hep B C IgM: NEGATIVE

## 2016-12-15 ENCOUNTER — Ambulatory Visit (INDEPENDENT_AMBULATORY_CARE_PROVIDER_SITE_OTHER): Payer: Managed Care, Other (non HMO) | Admitting: Gastroenterology

## 2016-12-15 ENCOUNTER — Other Ambulatory Visit (INDEPENDENT_AMBULATORY_CARE_PROVIDER_SITE_OTHER): Payer: Managed Care, Other (non HMO)

## 2016-12-15 ENCOUNTER — Encounter: Payer: Self-pay | Admitting: Gastroenterology

## 2016-12-15 VITALS — BP 100/60 | HR 76 | Ht 66.0 in | Wt 140.5 lb

## 2016-12-15 DIAGNOSIS — R945 Abnormal results of liver function studies: Principal | ICD-10-CM

## 2016-12-15 DIAGNOSIS — R7989 Other specified abnormal findings of blood chemistry: Secondary | ICD-10-CM | POA: Diagnosis not present

## 2016-12-15 DIAGNOSIS — R1011 Right upper quadrant pain: Secondary | ICD-10-CM

## 2016-12-15 DIAGNOSIS — B181 Chronic viral hepatitis B without delta-agent: Secondary | ICD-10-CM | POA: Insufficient documentation

## 2016-12-15 DIAGNOSIS — K769 Liver disease, unspecified: Secondary | ICD-10-CM

## 2016-12-15 LAB — HEPATIC FUNCTION PANEL
ALT: 253 U/L — AB (ref 0–53)
AST: 128 U/L — ABNORMAL HIGH (ref 0–37)
Albumin: 3.8 g/dL (ref 3.5–5.2)
Alkaline Phosphatase: 71 U/L (ref 39–117)
Bilirubin, Direct: 0.1 mg/dL (ref 0.0–0.3)
TOTAL PROTEIN: 7.3 g/dL (ref 6.0–8.3)
Total Bilirubin: 0.8 mg/dL (ref 0.2–1.2)

## 2016-12-15 NOTE — Patient Instructions (Signed)
You have been scheduled for an MRI at [facility] on 12/26/16. Your appointment time is 10am. Please arrive 30 minutes prior to your appointment time for registration purposes. Please make certain not to have anything to eat or drink 4 hours prior to your test. In addition, if you have any metal in your body, have a pacemaker or defibrillator, please be sure to let your ordering physician know. This test typically takes 45 minutes to 1 hour to complete. Should you need to reschedule, please call [phone] to do so.  Your physician has requested that you go to the basement for lab work before leaving today.

## 2016-12-15 NOTE — Progress Notes (Signed)
Please be sure he has a Hep B quantitative viral load by PCR as well as an HBV e antigen and e antibody. After we see the results I will decide how to proceed with treatment.

## 2016-12-15 NOTE — Progress Notes (Signed)
12/15/2016 Maurice Lowe [id number] 1980-06-17   HISTORY OF PRESENT ILLNESS:  This is a pleasant 36 year old male who is known here last year to Dr. Loletha Carrow for evaluation regarding viral hepatitis B. It appears that he has chronic active infection. Is not on any medication.  He is here today for follow-up from the ER. He presented there on 7/17 with complaints of right upper quadrant abdominal pain that began the day prior to his visit. At that time CBC was unremarkable except for a slightly low platelet count of 135.  CMP was normal except for AST of 107 and ALT 197.  These were up from August 2017 with an AST of 49 and ALT is 75. Alkaline phosphatase and total bili have been normal. Lipase is normal. Abdominal ultrasound was performed and showed a hypoechoic areas which may represent complicated cyst although they were different than previously visualized on the prior CT scan. CT scan of the abdomen and pelvis was also then repeated that showed one of the cyst had regressed and in its place were 3 low-density but not simple cystic lesions measuring up to 17 mm with altered perfusion. They're recommending non-emergent liver MRI in the setting of hepatitis B.  At this point he is feeling completely better. He says that he actually forgot about his hep B diagnosis and had been drinking a lot of alcohol recently, also taking some Tylenol but not in excess, drinking protein shakes. He discontinued all of that and once again is now feeling better 100% better.   Past Medical History:  Diagnosis Date  . Hepatitis   . Non Hodgkin's lymphoma [facility])    age [age]   Past Surgical History:  Procedure Laterality Date  . PORT-A-CATH REMOVAL    . PORTACATH PLACEMENT      reports that he has quit smoking. He has never used smokeless tobacco. He reports that he drinks alcohol. He reports that he uses drugs, including Marijuana. family history includes Heart disease in his mother; Stroke in his paternal  grandmother. Allergies  Allergen Reactions  . Phenergan [Promethazine] Other (See Comments)    Hyperactivity and agitation      Outpatient Encounter Prescriptions as of 12/15/2016  Medication Sig  . ondansetron (ZOFRAN ODT) 4 MG disintegrating tablet 4mg  ODT Q6 hours prn nausea/vomit  . oxycodone (OXY-IR) 5 MG capsule Take 1 capsule (5 mg total) by mouth every 6 (six) hours as needed.   No facility-administered encounter medications on file as of 12/15/2016.      REVIEW OF SYSTEMS  : All other systems reviewed and negative except where noted in the History of Present Illness.   PHYSICAL EXAM: BP 100/60 (BP Location: Left Arm, Patient Position: Sitting, Cuff Size: Normal)   Pulse 76   Ht 5\' 6"  (1.676 m)   Wt 140 lb 8 oz (63.7 kg)   BMI 22.68 kg/m  General: Well developed black male in no acute distress Head: Normocephalic and atraumatic Eyes:  Sclerae anicteric, conjunctiva pink. Ears: Normal auditory acuity Lungs: Clear throughout to auscultation; no increased WOB. Heart: Regular rate and rhythm; no M/R/G. Abdomen: Soft, non-distended.  BS present.  Non-tender. Musculoskeletal: Symmetrical with no gross deformities  Skin: No lesions on visible extremities Extremities: No edema  Neurological: Alert oriented x 4, grossly non-focal Psychological:  Alert and cooperative. Normal mood and affect  ASSESSMENT AND PLAN: *36 year old male who is previously evaluated for elevated LFTs. Found to have what appears to be chronic hepatitis  B. Has not followed up recently. Now with right upper quadrant abdominal pain for 1-2 days was unremarkable imaging although liver enzymes slightly increased from previous.  Unsure of the cause of his pain, ? Related to liver inflammation, but is now resolved.  Imaging showed what are suspected to be simple liver cysts, although with history of hep B they're recommending an MRI evaluation. We will order MRI with liver protocol. I'm going to update his labs  with repeat hepatitis B studies. If continues to have chronic active infection then will likely need treatment.  ? If Dr. Loletha Carrow will treat or refer him to hepatology or infectious disease.   CC:  [name], [name], MD

## 2016-12-16 LAB — HEPATITIS B CORE ANTIBODY, IGM: HEP B C IGM: NONREACTIVE

## 2016-12-18 LAB — HEPATITIS B E ANTIGEN: Hepatitis Be Antigen: REACTIVE — AB

## 2016-12-18 LAB — HEPATITIS B E ANTIBODY: Hepatitis Be Antibody: NONREACTIVE

## 2016-12-19 LAB — HEPATITIS B DNA, ULTRAQUANTITATIVE, PCR

## 2016-12-25 ENCOUNTER — Telehealth: Payer: Self-pay

## 2016-12-25 NOTE — Telephone Encounter (Signed)
Faxed prior auth form to [facility] at [phone] for Entecavir 0.5 mg once daily.

## 2016-12-26 ENCOUNTER — Ambulatory Visit (HOSPITAL_COMMUNITY): Payer: Managed Care, Other (non HMO)

## 2017-01-04 ENCOUNTER — Other Ambulatory Visit: Payer: Managed Care, Other (non HMO)

## 2017-01-04 ENCOUNTER — Ambulatory Visit (INDEPENDENT_AMBULATORY_CARE_PROVIDER_SITE_OTHER): Payer: Managed Care, Other (non HMO) | Admitting: Gastroenterology

## 2017-01-04 ENCOUNTER — Encounter: Payer: Self-pay | Admitting: Gastroenterology

## 2017-01-04 ENCOUNTER — Telehealth: Payer: Self-pay

## 2017-01-04 VITALS — BP 124/86 | HR 72 | Ht 66.0 in | Wt 134.5 lb

## 2017-01-04 DIAGNOSIS — R1011 Right upper quadrant pain: Secondary | ICD-10-CM

## 2017-01-04 DIAGNOSIS — R945 Abnormal results of liver function studies: Secondary | ICD-10-CM

## 2017-01-04 DIAGNOSIS — R7989 Other specified abnormal findings of blood chemistry: Secondary | ICD-10-CM

## 2017-01-04 DIAGNOSIS — K769 Liver disease, unspecified: Secondary | ICD-10-CM | POA: Diagnosis not present

## 2017-01-04 DIAGNOSIS — B181 Chronic viral hepatitis B without delta-agent: Secondary | ICD-10-CM | POA: Diagnosis not present

## 2017-01-04 NOTE — Telephone Encounter (Signed)
-----   Message from Doran Stabler, MD sent at 01/04/2017 10:23 AM EDT ----- Almyra Free,    Have we rec'd approval for Entecavir for this patient's Hepatitis B infection?  I would like to get him started on therapy.  - HD

## 2017-01-04 NOTE — Progress Notes (Signed)
     [facility] Progress Note  Chief Complaint: Chronic hepatitis B  Subjective  History:  Wilfrid follows up for his hepatitis B and elevated LFTs. We saw him a year ago after an ED visit, and he was diagnosed with hepatitis B. It was not clear if it was a chronic infection passing through immune clearance phase, or if he would remain active. He is route of entry is also unclear. 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 He reports being monogamous with one partner for the last 15 years, and she is not infected as far as he knows. He agrees that she should be tested.  He recently had a trip to the ED for right upper quadrant pain and says he was drinking and eating bad food, which he since stopped and now feels fine.  ROS: Cardiovascular:  no chest pain Respiratory: no dyspnea No rash or arthralgias Remainder systems negative except as above  The patient's Past Medical, Family and Social History were reviewed and are on file in the EMR.  Objective:  Med list reviewed  Vital signs in last 24 hrs: Vitals:   01/04/17 0934  BP: 124/86  Pulse: 72    Physical Exam    HEENT: sclera anicteric, oral mucosa moist without lesions  Neck: supple, no thyromegaly, JVD or lymphadenopathy  Cardiac: RRR without murmurs, S1S2 heard, no peripheral edema  Pulm: clear to auscultation bilaterally, normal RR and effort noted  Abdomen: soft, No tenderness, with active bowel sounds. No guarding or palpable hepatosplenomegaly.  Skin; warm and dry, no jaundice or rash  Recent Labs: 12/15/16:  AST 128,  ALT 253 AP 71, T bili 0.8 Hep B DNA > 100 E7 eAg pos, eAb neg HBV Asg pos  HIV neg one year ago  Radiologic studies:  MRI 12/20/16: 2.6 cm "unusual" lesion , maybe atypical cavernous hemangioma Also 55mm cyst  Rads recommended repeat  MRI in 3 months and AFP level  @ASSESSMENTPLANBEGIN @ Assessment: Encounter Diagnoses  Name Primary?  . 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 RUQ abdominal pain   . Elevated LFTs   . Liver lesion    He has chronic hepatitis B infection and needs antiviral therapy. We discussed that this would be long-term and he needs to be taken regularly to decrease the chance of resistance. I think the best medicine choice for him is entecavir 0.5 mg once daily.  I suspect this incidentally noted liver lesion is most likely benign, but I have ordered an AFP, and when I see him in a few months we will plan repeat imaging to ensure stability.  He understands the need to take medicine and see me regularly for this condition so we can decrease his chance of developing cirrhosis or liver cancer.  Total time 25 minutes, over half spent in counseling and coordination of care.   Nelida Meuse III

## 2017-01-04 NOTE — Patient Instructions (Signed)
If you are age 37 or older, your body mass index should be between 23-30. Your Body mass index is 21.71 kg/m. If this is out of the aforementioned range listed, please consider follow up with your Primary Care Provider.  If you are age 80 or younger, your body mass index should be between 19-25. Your Body mass index is 21.71 kg/m. If this is out of the aformentioned range listed, please consider follow up with your Primary Care Provider.   Your physician has requested that you go to the basement for the following lab work before leaving today: AFP   Thank you for choosing [facility]  Dr Wilfrid Lund III

## 2017-01-04 NOTE — Telephone Encounter (Signed)
Left message for patient to contact our office. Let him know that [facility] has been trying to call him about starting medication.

## 2017-01-04 NOTE — Telephone Encounter (Signed)
Please call him and let him know they are trying to reach him.  Perhaps he does not answer calls if does not recognize the number.  I would like him to start the med soon.

## 2017-01-04 NOTE — Telephone Encounter (Signed)
Called [facility], no prior approval is needed for Entecavir. They have been trying to get a hold of the patient for delivery. I also gave them his work number.

## 2017-01-05 LAB — AFP TUMOR MARKER: AFP-Tumor Marker: 4.9 ng/mL (ref <6.1)

## 2017-01-08 NOTE — Telephone Encounter (Signed)
Left message on machine to call back  

## 2017-01-09 NOTE — Telephone Encounter (Signed)
I received a message that [facility] has reached patient and he is to start his medication on 01/11/17.

## 2017-02-08 ENCOUNTER — Encounter: Payer: Self-pay | Admitting: Family Medicine

## 2017-02-12 ENCOUNTER — Encounter: Payer: Self-pay | Admitting: Gastroenterology

## 2018-02-05 ENCOUNTER — Ambulatory Visit (HOSPITAL_COMMUNITY)
Admission: EM | Admit: 2018-02-05 | Discharge: 2018-02-05 | Disposition: A | Discharge status: Home / Self Care | Payer: BLUE CROSS/BLUE SHIELD | Attending: Family Medicine | Admitting: Family Medicine

## 2018-02-05 ENCOUNTER — Ambulatory Visit (INDEPENDENT_AMBULATORY_CARE_PROVIDER_SITE_OTHER): Payer: BLUE CROSS/BLUE SHIELD

## 2018-02-05 ENCOUNTER — Encounter (HOSPITAL_COMMUNITY): Payer: Self-pay

## 2018-02-05 DIAGNOSIS — R5383 Other fatigue: Secondary | ICD-10-CM | POA: Diagnosis not present

## 2018-02-05 DIAGNOSIS — M791 Myalgia, unspecified site: Secondary | ICD-10-CM | POA: Diagnosis not present

## 2018-02-05 DIAGNOSIS — R509 Fever, unspecified: Secondary | ICD-10-CM | POA: Diagnosis not present

## 2018-02-05 DIAGNOSIS — R0789 Other chest pain: Secondary | ICD-10-CM | POA: Diagnosis not present

## 2018-02-05 DIAGNOSIS — R42 Dizziness and giddiness: Secondary | ICD-10-CM

## 2018-02-05 DIAGNOSIS — R079 Chest pain, unspecified: Secondary | ICD-10-CM | POA: Diagnosis not present

## 2018-02-05 DIAGNOSIS — B349 Viral infection, unspecified: Secondary | ICD-10-CM | POA: Diagnosis not present

## 2018-02-05 LAB — COMPREHENSIVE METABOLIC PANEL
ALK PHOS: 82 U/L (ref 38–126)
ALT: 1796 U/L — ABNORMAL HIGH (ref 0–44)
ANION GAP: 8 (ref 5–15)
AST: 1807 U/L — ABNORMAL HIGH (ref 15–41)
Albumin: 3.4 g/dL — ABNORMAL LOW (ref 3.5–5.0)
BILIRUBIN TOTAL: 1.7 mg/dL — AB (ref 0.3–1.2)
BUN: <5 mg/dL — ABNORMAL LOW (ref 6–20)
CALCIUM: 8.5 mg/dL — AB (ref 8.9–10.3)
CO2: 28 mmol/L (ref 22–32)
Chloride: 101 mmol/L (ref 98–111)
Creatinine, Ser: 0.88 mg/dL (ref 0.61–1.24)
GFR calc non Af Amer: >60 mL/min (ref >60)
Glucose, Bld: 88 mg/dL (ref 70–99)
Potassium: 4 mmol/L (ref 3.5–5.1)
Sodium: 137 mmol/L (ref 135–145)
TOTAL PROTEIN: 7.6 g/dL (ref 6.5–8.1)

## 2018-02-05 LAB — CBC
HEMATOCRIT: 41.3 % (ref 39.0–52.0)
HEMOGLOBIN: 13.7 g/dL (ref 13.0–17.0)
MCH: 28.4 pg (ref 26.0–34.0)
MCHC: 33.2 g/dL (ref 30.0–36.0)
MCV: 85.5 fL (ref 78.0–100.0)
Platelets: 92 10*3/uL — ABNORMAL LOW (ref 150–400)
RBC: 4.83 MIL/uL (ref 4.22–5.81)
RDW: 13.2 % (ref 11.5–15.5)
WBC: 3.3 10*3/uL — ABNORMAL LOW (ref 4.0–10.5)

## 2018-02-05 LAB — POCT URINALYSIS DIP (DEVICE)
BILIRUBIN URINE: NEGATIVE
Glucose, UA: NEGATIVE mg/dL
Hgb urine dipstick: NEGATIVE
Ketones, ur: NEGATIVE mg/dL
LEUKOCYTES UA: NEGATIVE
NITRITE: NEGATIVE
PH: 7 (ref 5.0–8.0)
Protein, ur: NEGATIVE mg/dL
Specific Gravity, Urine: 1.015 (ref 1.005–1.030)
UROBILINOGEN UA: 1 mg/dL (ref 0.0–1.0)

## 2018-02-05 LAB — POCT RAPID STREP A: Streptococcus, Group A Screen (Direct): NEGATIVE

## 2018-02-05 MED ORDER — ACETAMINOPHEN 325 MG PO TABS
650.0000 mg | ORAL_TABLET | Freq: Once | ORAL | Status: AC
  Administered 2018-02-05: 650 mg via ORAL

## 2018-02-05 MED ORDER — ACETAMINOPHEN 325 MG PO TABS
ORAL_TABLET | ORAL | Status: AC
  Filled 2018-02-05: qty 2

## 2018-02-05 MED ORDER — IBUPROFEN 600 MG PO TABS: 600.0000 mg | ORAL_TABLET | Freq: Four times a day (QID) | ORAL | 0 refills | Status: DC | PRN

## 2018-02-05 NOTE — Discharge Instructions (Addendum)
Urine Normal, Strep negative, Chest Xray normal  Symptoms likely viral  We also drew blood to check for infection/liver function/electrolytes; We will call you if anything is abnormal or requiring emergency room  Use anti-inflammatories for fever/pain/swelling. You may take up to 800 mg Ibuprofen every 8 hours with food. You may supplement Ibuprofen with Tylenol [phone] mg every 8 hours.  Please go to emergency room if fever persisting, developing worsening chest discomfort, shortness of breath, new symptoms

## 2018-02-05 NOTE — ED Triage Notes (Signed)
Pt states he has had a fever off and on since Last Thursday pt has been feeling achy

## 2018-02-05 NOTE — ED Provider Notes (Signed)
[facility]    CSN: [id number] Arrival date & time: 02/05/18  1720     History   Chief Complaint Chief Complaint  Patient presents with  . Fever    HPI Maurice Lowe is a 37 y.o. male history of hepatitis B presenting today for evaluation of a fever.  Patient states that beginning Thursday he began to feel dizzy, lightheaded, dehydrated, hot and cold chills, fatigue and lack of appetite.  Is also had body aches which have persisted.  For the past couple days he has noticed some mild congestion.  He denies cough or sore throat.  Denies shortness of breath, nausea, vomiting, diarrhea.  Has some had some discomfort in his chest.  Denies abdominal pain.  He does note that he recently stopped taking his medicine for hepatitis B but recently started taking again as he was concerned that this may have been the cause of his symptoms.  Denies urinary symptoms.  HPI  Past Medical History:  Diagnosis Date  . Elevated liver function tests   . Hepatic cyst   . Hepatitis   . Hepatitis B   . Non Hodgkin's lymphoma [facility])    age 31-16    Patient Active Problem List   Diagnosis Date Noted  . RUQ abdominal pain 12/15/2016  . 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 Liver lesion 12/15/2016  . Elevated LFTs   . Fever, unspecified 12/09/2015    Past Surgical History:  Procedure Laterality Date  . PORT-A-CATH REMOVAL    . PORTACATH PLACEMENT         Home Medications    Prior to Admission medications   Medication Sig Start Date End Date Taking? Authorizing Provider  ibuprofen (ADVIL,MOTRIN) 600 MG tablet Take 1 tablet (600 mg total) by mouth every 6 (six) hours as needed. 02/05/18   Hasheem Voland, Elesa Hacker, PA-C    Family History Family History  Problem Relation Age of Onset  . Heart disease Mother   . Stroke Paternal Grandmother   . Hepatitis Neg Hx   . Liver disease Neg Hx   . Colon cancer Neg Hx   . Esophageal cancer Neg  Hx   . Stomach cancer Neg Hx   . Pancreatic cancer Neg Hx   . Colon polyps Neg Hx     Social History Social History   Tobacco Use  . Smoking status: Former Research scientist (life sciences)  . Smokeless tobacco: Never Used  Substance Use Topics  . Alcohol use: No    Comment: none since admitted to hospital 12/09/15  . Drug use: No    Types: Marijuana    Comment: when travels to [place]; now "done" per pt as of 12/28/15     Allergies   Phenergan [promethazine]   Review of Systems Review of Systems  Constitutional: Positive for appetite change, chills, fatigue and fever. Negative for activity change.  HENT: Positive for congestion and rhinorrhea. Negative for ear pain, sinus pressure, sore throat and trouble swallowing.   Eyes: Negative for discharge and redness.  Respiratory: Negative for cough, chest tightness and shortness of breath.   Cardiovascular: Negative for chest pain.  Gastrointestinal: Negative for abdominal pain, diarrhea, nausea and vomiting.  Genitourinary: Negative for difficulty urinating, discharge, dysuria, frequency, penile pain, penile swelling, scrotal swelling and testicular pain.  Musculoskeletal: Positive for myalgias.  Skin: Negative for rash.  Neurological: Positive for weakness. Negative for dizziness, light-headedness and headaches.     Physical Exam Triage Vital  Signs ED Triage Vitals  Enc Vitals Group     BP 02/05/18 1801 110/63     Pulse Rate 02/05/18 1801 83     Resp 02/05/18 1801 18     Temp 02/05/18 1801 (!) 101.2 F (38.4 C)     Temp src --      SpO2 02/05/18 1801 99 %     Weight 02/05/18 1800 138 lb (62.6 kg)     Height --      Head Circumference --      Peak Flow --      Pain Score --      Pain Loc --      Pain Edu? --      Excl. in [place]? --    No data found.  Updated Vital Signs BP 110/63 (BP Location: Right Arm)   Pulse 83   Temp (!) 101.2 F (38.4 C)   Resp 18   Wt 138 lb (62.6 kg)   SpO2 99%   BMI 22.27 kg/m  Fever rechecked 99.2 Visual  Acuity Right Eye Distance:   Left Eye Distance:   Bilateral Distance:    Right Eye Near:   Left Eye Near:    Bilateral Near:     Physical Exam  Constitutional: He appears well-developed and well-nourished.  HENT:  Head: Normocephalic and atraumatic.  Bilateral ears without tenderness to palpation of external auricle, tragus and mastoid, EAC's without erythema or swelling, TM's with good bony landmarks and cone of light. Non erythematous.  Oral mucosa pink and moist, no tonsillar enlargement or exudate. Posterior pharynx patent and nonerythematous, uvula appears erythematous and slightly enlarged. Normal phonation.  Eyes: Conjunctivae are normal.  Neck: Neck supple.  Cardiovascular: Normal rate and regular rhythm.  No murmur heard. Pulmonary/Chest: Effort normal and breath sounds normal. No respiratory distress.  Breathing comfortably at rest, CTABL, no wheezing, rales or other adventitious sounds auscultated  Abdominal: Soft. There is no tenderness.  Nontender to light deep palpation throughout abdomen  Musculoskeletal: He exhibits no edema.  Neurological: He is alert.  Skin: Skin is warm and dry.  Psychiatric: He has a normal mood and affect.  Nursing note and vitals reviewed.    UC Treatments / Results  Labs (all labs ordered are listed, but only abnormal results are displayed) Labs Reviewed  CBC - Abnormal; Notable for the following components:      Result Value   WBC 3.3 (*)    Platelets 92 (*)    All other components within normal limits  COMPREHENSIVE METABOLIC PANEL - Abnormal; Notable for the following components:   BUN <5 (*)    Calcium 8.5 (*)    Albumin 3.4 (*)    AST 1,807 (*)    ALT 1,796 (*)    Total Bilirubin 1.7 (*)    All other components within normal limits  CULTURE, GROUP A STREP [facility])  POCT URINALYSIS DIP (DEVICE)  POCT RAPID STREP A    EKG None  Radiology Dg Chest 2 View  Result Date: 02/05/2018 CLINICAL DATA:  Left-sided chest pain,  fever, and chills for 5 days. EXAM: CHEST - 2 VIEW COMPARISON:  12/10/2015 FINDINGS: The heart size and mediastinal contours are within normal limits. Both lungs are clear. The visualized skeletal structures are unremarkable. IMPRESSION: No active cardiopulmonary disease. Electronically Signed   By: Earle Gell M.D.   On: 02/05/2018 18:47    Procedures Procedures (including critical care time)  Medications Ordered in UC Medications  acetaminophen (TYLENOL)  tablet 650 mg (650 mg Oral Given 02/05/18 1812)    Initial Impression / Assessment and Plan / UC Course  I have reviewed the triage vital signs and the nursing notes.  Pertinent labs & imaging results that were available during my care of the patient were reviewed by me and considered in my medical decision making (see chart for details).     Fever persisting for 5 days without clear symptoms, has had some mild rhinorrhea.  Chest x-ray negative, urine unremarkable, strep negative.  CBC does show decreased white count.  Would likely viral illness.  Will recommend to continue to manage fever with anti-inflammatories and monitor symptoms.  Push fluids.Discussed strict return precautions. Patient verbalized understanding and is agreeable with plan.  Final Clinical Impressions(s) / UC Diagnoses   Final diagnoses:  Fever, unspecified  Fatigue, unspecified type  Viral illness     Discharge Instructions     Urine Normal, Strep negative, Chest Xray normal  Symptoms likely viral  We also drew blood to check for infection/liver function/electrolytes; We will call you if anything is abnormal or requiring emergency room  Use anti-inflammatories for fever/pain/swelling. You may take up to 800 mg Ibuprofen every 8 hours with food. You may supplement Ibuprofen with Tylenol [phone] mg every 8 hours.  Please go to emergency room if fever persisting, developing worsening chest discomfort, shortness of breath, new symptoms   ED Prescriptions     Medication Sig Dispense Auth. Provider   ibuprofen (ADVIL,MOTRIN) 600 MG tablet Take 1 tablet (600 mg total) by mouth every 6 (six) hours as needed. 30 tablet Seanpaul Preece, [place] C, PA-C     Controlled Substance Prescriptions [place] Controlled Substance Registry consulted? Not Applicable   Janith Lima, [place] 02/05/18 2035

## 2018-02-07 ENCOUNTER — Telehealth (HOSPITAL_COMMUNITY): Payer: Self-pay

## 2018-02-07 NOTE — Telephone Encounter (Signed)
Liver enzymes are elevated. Notified Dr. Alphonzo Cruise. Per Dr. Alphonzo Cruise patient should go to the ER. Attempted to reach patient. No answer. Message sent to [facility]

## 2018-02-08 ENCOUNTER — Other Ambulatory Visit: Payer: Self-pay

## 2018-02-08 ENCOUNTER — Encounter (HOSPITAL_COMMUNITY): Payer: Self-pay | Admitting: Emergency Medicine

## 2018-02-08 ENCOUNTER — Emergency Department (HOSPITAL_COMMUNITY): Payer: BLUE CROSS/BLUE SHIELD

## 2018-02-08 ENCOUNTER — Emergency Department (HOSPITAL_COMMUNITY)
Admission: EM | Admit: 2018-02-08 | Discharge: 2018-02-09 | Disposition: A | Discharge status: Home / Self Care | Payer: BLUE CROSS/BLUE SHIELD | Attending: Emergency Medicine | Admitting: Emergency Medicine

## 2018-02-08 DIAGNOSIS — R945 Abnormal results of liver function studies: Secondary | ICD-10-CM | POA: Insufficient documentation

## 2018-02-08 DIAGNOSIS — R1010 Upper abdominal pain, unspecified: Secondary | ICD-10-CM

## 2018-02-08 DIAGNOSIS — R7989 Other specified abnormal findings of blood chemistry: Secondary | ICD-10-CM

## 2018-02-08 DIAGNOSIS — K828 Other specified diseases of gallbladder: Secondary | ICD-10-CM | POA: Diagnosis not present

## 2018-02-08 LAB — COMPREHENSIVE METABOLIC PANEL
ALT: 1674 U/L — AB (ref 0–44)
AST: 1492 U/L — AB (ref 15–41)
Albumin: 3.1 g/dL — ABNORMAL LOW (ref 3.5–5.0)
Alkaline Phosphatase: 88 U/L (ref 38–126)
Anion gap: 6 (ref 5–15)
BUN: 5 mg/dL — AB (ref 6–20)
CHLORIDE: 105 mmol/L (ref 98–111)
CO2: 26 mmol/L (ref 22–32)
Calcium: 8 mg/dL — ABNORMAL LOW (ref 8.9–10.3)
Creatinine, Ser: 0.77 mg/dL (ref 0.61–1.24)
GFR calc Af Amer: >60 mL/min (ref >60)
GFR calc non Af Amer: >60 mL/min (ref >60)
Glucose, Bld: 126 mg/dL — ABNORMAL HIGH (ref 70–99)
Potassium: 3.9 mmol/L (ref 3.5–5.1)
SODIUM: 137 mmol/L (ref 135–145)
TOTAL PROTEIN: 6.7 g/dL (ref 6.5–8.1)
Total Bilirubin: 1.1 mg/dL (ref 0.3–1.2)

## 2018-02-08 LAB — CULTURE, GROUP A STREP (THRC)

## 2018-02-08 LAB — CBC
HEMATOCRIT: 38.3 % — AB (ref 39.0–52.0)
HEMOGLOBIN: 12.7 g/dL — AB (ref 13.0–17.0)
MCH: 28.7 pg (ref 26.0–34.0)
MCHC: 33.2 g/dL (ref 30.0–36.0)
MCV: 86.7 fL (ref 78.0–100.0)
Platelets: 71 10*3/uL — ABNORMAL LOW (ref 150–400)
RBC: 4.42 MIL/uL (ref 4.22–5.81)
RDW: 14 % (ref 11.5–15.5)
WBC: 3.2 10*3/uL — ABNORMAL LOW (ref 4.0–10.5)

## 2018-02-08 LAB — LIPASE, BLOOD: LIPASE: 60 U/L — AB (ref 11–51)

## 2018-02-08 NOTE — ED Provider Notes (Signed)
Patient placed in Quick Look pathway, seen and evaluated   Chief Complaint: abdominal pain  HPI:  Maurice Lowe is a 37 y.o. male who presents to the ED with upper abdominal pain that started 3 days ago and has continued. Patient sent by his doctor due to elevated liver enzymes. Patient reports that the pain in the RUQ is 8/10. Patient has been taking tylenol for fever as high as 101.2 with last dose 2 hours prior to arrival to the ED.  ROS: General: fever and chills  GI: abdominalpain  Physical Exam:  BP 132/86 (BP Location: Right Arm)   Pulse 72   Temp 98.8 F (37.1 C) (Oral)   Resp 16   Ht 5\' 6"  (1.676 m)   Wt 62.6 kg   SpO2 99%   BMI 22.27 kg/m    Gen: No distress  Neuro: Awake and Alert  Skin: Warm and dry  Abdomen: soft, tender to palpation epigastric and RUQ   Initiation of care has begun. The patient has been counseled on the process, plan, and necessity for staying for the completion/evaluation, and the remainder of the medical screening examination    Ashley Murrain, NP 02/08/18 1729    Drenda Freeze, MD 02/08/18 [phone]

## 2018-02-08 NOTE — ED Triage Notes (Signed)
Pt sent by doctor for elevated AST & ALT enzymes. Pt reports RUQ pain 8/10 with palpation. Pt has been taking Tylenol at home for a fever, high of 101.67F. Last dose 2 hours ago.

## 2018-02-09 LAB — URINALYSIS, ROUTINE W REFLEX MICROSCOPIC
BILIRUBIN URINE: NEGATIVE
GLUCOSE, UA: NEGATIVE mg/dL
Hgb urine dipstick: NEGATIVE
KETONES UR: NEGATIVE mg/dL
Leukocytes, UA: NEGATIVE
NITRITE: NEGATIVE
Protein, ur: NEGATIVE mg/dL
SPECIFIC GRAVITY, URINE: 1.014 (ref 1.005–1.030)
pH: 7 (ref 5.0–8.0)

## 2018-02-09 LAB — HEPATIC FUNCTION PANEL
ALBUMIN: 3.1 g/dL — AB (ref 3.5–5.0)
ALK PHOS: 85 U/L (ref 38–126)
ALT: 1641 U/L — ABNORMAL HIGH (ref 0–44)
AST: 1495 U/L — ABNORMAL HIGH (ref 15–41)
BILIRUBIN DIRECT: 0.5 mg/dL — AB (ref 0.0–0.2)
Indirect Bilirubin: 0.6 mg/dL (ref 0.3–0.9)
Total Bilirubin: 1.1 mg/dL (ref 0.3–1.2)
Total Protein: 6.7 g/dL (ref 6.5–8.1)

## 2018-02-09 MED ORDER — SODIUM CHLORIDE 0.9 % IV BOLUS
1000.0000 mL | Freq: Once | INTRAVENOUS | Status: AC
  Administered 2018-02-09: 1000 mL via INTRAVENOUS

## 2018-02-09 NOTE — ED Notes (Signed)
Phone in patients room ringing.  Unplugged per patient request.  States everyone I know has my cell phone number so they can call it if they want.

## 2018-02-09 NOTE — Discharge Instructions (Signed)
Please follow-up with your doctor and/or Gastroenterologist.  Please return for new or worsening symptoms.

## 2018-02-09 NOTE — ED Notes (Signed)
Pt reminded a urine sample has been requested. Pt informed this EMT that he is "dehydrated and y'all wont give me no IV fluids or let me drink anything so no I can't pee." Pt stated "I haven't anything to drink since noon and y'all won't give me nothing so I'm dehydrated and can't pee."

## 2018-02-12 ENCOUNTER — Other Ambulatory Visit (INDEPENDENT_AMBULATORY_CARE_PROVIDER_SITE_OTHER): Payer: BLUE CROSS/BLUE SHIELD

## 2018-02-12 ENCOUNTER — Ambulatory Visit (INDEPENDENT_AMBULATORY_CARE_PROVIDER_SITE_OTHER): Payer: BLUE CROSS/BLUE SHIELD | Admitting: Gastroenterology

## 2018-02-12 ENCOUNTER — Encounter: Payer: Self-pay | Admitting: Gastroenterology

## 2018-02-12 VITALS — BP 120/80 | HR 80 | Ht 66.0 in | Wt 142.0 lb

## 2018-02-12 DIAGNOSIS — B181 Chronic viral hepatitis B without delta-agent: Secondary | ICD-10-CM

## 2018-02-12 DIAGNOSIS — R748 Abnormal levels of other serum enzymes: Secondary | ICD-10-CM | POA: Diagnosis not present

## 2018-02-12 DIAGNOSIS — R110 Nausea: Secondary | ICD-10-CM | POA: Diagnosis not present

## 2018-02-12 DIAGNOSIS — R1010 Upper abdominal pain, unspecified: Secondary | ICD-10-CM

## 2018-02-12 LAB — HEPATIC FUNCTION PANEL
ALBUMIN: 3.6 g/dL (ref 3.5–5.2)
ALK PHOS: 97 U/L (ref 39–117)
ALT: 789 U/L — AB (ref 0–53)
AST: 354 U/L — ABNORMAL HIGH (ref 0–37)
Bilirubin, Direct: 0.5 mg/dL — ABNORMAL HIGH (ref 0.0–0.3)
Total Bilirubin: 1.4 mg/dL — ABNORMAL HIGH (ref 0.2–1.2)
Total Protein: 8.4 g/dL — ABNORMAL HIGH (ref 6.0–8.3)

## 2018-02-12 LAB — PROTIME-INR
INR: 1.2 ratio — AB (ref 0.8–1.0)
Prothrombin Time: 13.7 s — ABNORMAL HIGH (ref 9.6–13.1)

## 2018-02-12 MED ORDER — ENTECAVIR 0.5 MG PO TABS: 0.5000 mg | ORAL_TABLET | Freq: Every day | ORAL | 11 refills | Status: DC

## 2018-02-12 NOTE — Patient Instructions (Signed)
If you are age 38 or older, your body mass index should be between 23-30. Your Body mass index is 22.92 kg/m. If this is out of the aforementioned range listed, please consider follow up with your Primary Care Provider.  If you are age 69 or younger, your body mass index should be between 19-25. Your Body mass index is 22.92 kg/m. If this is out of the aformentioned range listed, please consider follow up with your Primary Care Provider.   Your provider has requested that you go to the basement level for lab work before leaving today. Press "B" on the elevator. The lab is located at the first door on the left as you exit the elevator.  Dexialant samples Lot [id number] exp 08-2020. Take 1 every morning for 10 days.  It was a pleasure to see you today!  Dr. Loletha Carrow

## 2018-02-12 NOTE — Progress Notes (Addendum)
[facility] Progress Note  Chief Complaint: Elevated LFTs  Subjective  History:  This is a 37 year old man I last saw August 2018 for chronic hepatitis B virus infection and elevated LFTs that were also partially related to alcohol use.  I started him on antiviral therapy with entecavir 0.5 mg daily.  He also had an incidentally noted liver lesion.  Plans were for an AFP and follow-up imaging.  He did not follow-up with our clinic as recommended, and admits that he took the entecavir for about 2 months and stopped because he was feeling well.  He regrets this and understands that was not the right choice.  He also went back to using alcohol regularly and smoking which she also knows were not good choices for him.  He was in the emergency department last week for fever and right upper quadrant pain.  He had temperature reportedly as high as 101.2, and was referred by his family practice doctor to the ED because of significant elevated transaminases.  Avrian reports being given ibuprofen over several days which upset his stomach and is concerned he has an ulcer.  He then resumed the entecavir once daily on his own late last week with the supply he had leftover from last year. He has no more fever, his upper abdominal pain bloating persist.  He has nausea that has affected his appetite.  ROS: Cardiovascular:  no chest pain Respiratory: no dyspnea He has not noticed any yellowing of the eyes or skin or acholic stool Remainder of systems negative except as above  The patient's Past Medical, Family and Social History were reviewed and are on file in the EMR. Past Medical History:  Diagnosis Date  . Elevated liver function tests   . Hepatic cyst   . Hepatitis   . Hepatitis B   . Non Hodgkin's lymphoma ([place])    age [age]    Objective:  Med list reviewed  Current Outpatient Medications:  .  Ascorbic Acid (VITAMIN C) 1000 MG tablet, Take 1,000 mg by mouth daily., Disp: , Rfl:    .  co-enzyme Q-10 30 MG capsule, Take 50 mg by mouth daily., Disp: , Rfl:  .  Ergocalciferol (VITAMIN D2 PO), Take by mouth daily., Disp: , Rfl:  .  milk thistle 175 MG tablet, Take 175 mg by mouth daily., Disp: , Rfl:  .  Omega-3 Fatty Acids (FISH OIL) 1000 MG CPDR, Take by mouth daily., Disp: , Rfl:  .  vitamin A 10000 UNIT capsule, Take 10,000 Units by mouth daily., Disp: , Rfl:  .  vitamin B-12 (CYANOCOBALAMIN) 500 MCG tablet, Take 500 mcg by mouth daily., Disp: , Rfl:  .  entecavir (BARACLUDE) 0.5 MG tablet, Take 1 tablet (0.5 mg total) by mouth daily., Disp: 30 tablet, Rfl: 11   Vital signs in last 24 hrs: Vitals:   02/12/18 1559  BP: 120/80  Pulse: 80    Physical Exam  He is well-appearing, alert, conversational and appropriate.  HEENT: sclera anicteric, oral mucosa moist without lesions  Neck: supple, no thyromegaly, JVD or lymphadenopathy  Cardiac: RRR without murmurs, S1S2 heard, no peripheral edema  Pulm: clear to auscultation bilaterally, normal RR and effort noted  Abdomen: soft, mild upper tenderness, with active bowel sounds.  Left lobe liver felt on deep inspiration, tender  Skin; warm and dry, no jaundice or rash Neuro: Steady gait, normal mentation, normal gross motor function and fluent speech, no asterixis  Recent Labs:  CMP Latest  Ref Rng & Units 02/12/2018 02/08/2018 02/08/2018  Glucose 70 - 99 mg/dL - - 126(H)  BUN 6 - 20 mg/dL - - 5(L)  Creatinine 0.61 - 1.24 mg/dL - - 0.77  Sodium 135 - 145 mmol/L - - 137  Potassium 3.5 - 5.1 mmol/L - - 3.9  Chloride 98 - 111 mmol/L - - 105  CO2 22 - 32 mmol/L - - 26  Calcium 8.9 - 10.3 mg/dL - - 8.0(L)  Total Protein 6.0 - 8.3 g/dL 8.4(H) 6.7 6.7  Total Bilirubin 0.2 - 1.2 mg/dL 1.4(H) 1.1 1.1  Alkaline Phos 39 - 117 U/L 97 85 88  AST 0 - 37 U/L 354(H) 1,495(H) 1,492(H)  ALT 0 - 53 U/L 754(H) 1,641(H) 1,674(H)   CBC Latest Ref Rng & Units 02/08/2018 02/05/2018 12/05/2016  WBC 4.0 - 10.5 K/uL 3.2(L) 3.3(L) 4.8   Hemoglobin 13.0 - 17.0 g/dL 12.7(L) 13.7 13.3  Hematocrit 39.0 - 52.0 % 38.3(L) 41.3 40.9  Platelets 150 - 400 K/uL 71(L) 92(L) 135(L)   AFP 4.9 on 01/04/2017   Radiologic studies:  ABDOMEN ULTRASOUND COMPLETE   COMPARISON:  Right upper quadrant abdominal ultrasound and CT abdomen 12/05/2016. MRI 12/26/2016.   FINDINGS: Gallbladder: Gallbladder sludge and mild pericholecystic fluid are noted. No gallstones, gallbladder wall thickening or sonographic Percell Miller sign (confirmed in discussion with the technologist).   Common bile duct: Diameter: 3 mm   Liver: Heterogeneous lesion is noted in the left hepatic lobe, measuring 2.6 x 2.0 x 2.6 cm. This has not significantly changed in size from the previous MRI. No new or enlarging hepatic lesions are identified. Portal vein is patent on color Doppler imaging with normal direction of blood flow towards the liver.   IVC: No abnormality visualized.   Pancreas: Visualized portion unremarkable.   Spleen: Size and appearance within normal limits.   Right Kidney: Length: 11.0 cm. Echogenicity within normal limits. No mass or hydronephrosis visualized.   Left Kidney: Length: 11.4 cm. Echogenicity within normal limits. No mass or hydronephrosis visualized.   Abdominal aorta: No aneurysm visualized.   Other findings: None.   IMPRESSION: 1. No definite acute findings. There is mild gallbladder sludge and pericholecystic fluid, although no gallbladder wall thickening, sonographic Murphy's sign or biliary dilatation. 2. Indeterminate lesion in the left hepatic lobe has not significantly changed in size from MRI 13 months ago. No enlarging hepatic lesions identified.     Electronically Signed   By: Richardean Sale M.D.   On: 02/08/2018 19:12     CXR 02/05/18:   Nothing acute   @ASSESSMENTPLANBEGIN @ Assessment: Encounter Diagnoses  Name Primary?  . Chronic viral hepatitis B without delta agent and without coma ([place]) Yes  .  Abnormal transaminases   . Upper abdominal pain   . Nausea    He is in the midst of another flare of his chronic hepatitis B with unfortunate noncompliance with therapy and follow-up.  I stressed to Maurice Lowe the importance of chronic therapy for this hepatitis to prevent flares, 1 of which could ultimately result in fulminant hepatic failure and death.  He also risks that hepatocellular carcinoma with poorly controlled hepatitis infection.  He expresses standing about the gravity of this condition and his need for optimal adherence to therapy and other recommendations.  Plan: Hepatic function panel today and PT/INR Hepatitis B quantitative DNA, hepatitis B E antigen and E antibody And new prescription was written for entecavir 0.5 mg once daily begin immediately and continue until further notice, probably indefinitely if  it works to get over this flare and maintain remission. Avoid alcohol and Tylenol altogether 10 days of Dexilant 60 mg samples were given to take once daily GERD I suspect his upper intestinal symptoms are from the acute hepatitis and not an ulcer.  Total time 40 minutes, over half spent face-to-face with patient in counseling and coordination of care.   Nelida Meuse III

## 2018-02-14 LAB — HEPATITIS B DNA, ULTRAQUANTITATIVE, PCR
Hepatitis B DNA (Calc): 4.42 Log IU/mL — ABNORMAL HIGH
Hepatitis B DNA: 26600 IU/mL — ABNORMAL HIGH

## 2018-02-16 ENCOUNTER — Ambulatory Visit (HOSPITAL_COMMUNITY)
Admission: EM | Admit: 2018-02-16 | Discharge: 2018-02-16 | Disposition: A | Discharge status: Home / Self Care | Payer: BLUE CROSS/BLUE SHIELD | Attending: Family Medicine | Admitting: Family Medicine

## 2018-02-16 ENCOUNTER — Encounter (HOSPITAL_COMMUNITY): Payer: Self-pay | Admitting: Emergency Medicine

## 2018-02-16 DIAGNOSIS — Z77018 Contact with and (suspected) exposure to other hazardous metals: Secondary | ICD-10-CM | POA: Diagnosis not present

## 2018-02-16 DIAGNOSIS — S90812A Abrasion, left foot, initial encounter: Secondary | ICD-10-CM

## 2018-02-16 DIAGNOSIS — Z23 Encounter for immunization: Secondary | ICD-10-CM | POA: Diagnosis not present

## 2018-02-16 MED ORDER — TETANUS-DIPHTH-ACELL PERTUSSIS 5-2.5-18.5 LF-MCG/0.5 IM SUSP
0.5000 mL | Freq: Once | INTRAMUSCULAR | Status: AC
  Administered 2018-02-16: 0.5 mL via INTRAMUSCULAR

## 2018-02-16 MED ORDER — TETANUS-DIPHTH-ACELL PERTUSSIS 5-2.5-18.5 LF-MCG/0.5 IM SUSP
INTRAMUSCULAR | Status: AC
  Filled 2018-02-16: qty 0.5

## 2018-02-16 NOTE — ED Triage Notes (Signed)
Pt scraped his foot last night on the rusty bed frame. Would like his foot dressed and a tetanus shot.

## 2018-02-19 ENCOUNTER — Other Ambulatory Visit: Payer: Self-pay

## 2018-02-19 DIAGNOSIS — B181 Chronic viral hepatitis B without delta-agent: Secondary | ICD-10-CM

## 2018-02-26 ENCOUNTER — Other Ambulatory Visit (INDEPENDENT_AMBULATORY_CARE_PROVIDER_SITE_OTHER): Payer: BLUE CROSS/BLUE SHIELD

## 2018-02-26 DIAGNOSIS — B181 Chronic viral hepatitis B without delta-agent: Secondary | ICD-10-CM

## 2018-02-26 LAB — HEPATIC FUNCTION PANEL
ALK PHOS: 78 U/L (ref 39–117)
ALT: 114 U/L — ABNORMAL HIGH (ref 0–53)
AST: 93 U/L — AB (ref 0–37)
Albumin: 3.6 g/dL (ref 3.5–5.2)
BILIRUBIN DIRECT: 0.2 mg/dL (ref 0.0–0.3)
BILIRUBIN TOTAL: 0.6 mg/dL (ref 0.2–1.2)
Total Protein: 8.8 g/dL — ABNORMAL HIGH (ref 6.0–8.3)

## 2018-02-26 NOTE — ED Provider Notes (Signed)
[place]   [id number] 02/16/18 Arrival Time: 6578  ASSESSMENT & PLAN:  1. Foot abrasion, left, initial encounter    Meds ordered this encounter  Medications  . Tdap (BOOSTRIX) injection 0.5 mL   OTC analgesics. Should heal well. May f/u here if needed.  Reviewed expectations re: course of current medical issues. Questions answered. Outlined signs and symptoms indicating need for more acute intervention. Patient verbalized understanding. After Visit Summary given.  SUBJECTIVE: History from: patient. Maurice Lowe is a 37 y.o. male who reports abrasions of his left proximal dorsal; no bleeding; requesting tetanus shot. Scraped on curb. Today. Ambulatory without difficulty. No drainage from wounds. No extremity sensation changes or weakness. Minimal discomfort. No home treatment. No specific aggravating or alleviating factors reported.  ROS: As per HPI.   OBJECTIVE:  Vitals:   02/16/18 1229  BP: 110/73  Pulse: 77  Resp: 16  Temp: 98.6 F (37 C)  SpO2: 99%    General appearance: alert; no distress Extremities: warm and well perfused; symmetrical with no gross deformities; abrasions over L proximal dorsal foot; 2 abrasions 0.5cm and 1cm each; some swelling around these; no significant tenderness to palpation; normal L ankle ROM CV: brisk extremity capillary refill Skin: warm and dry Neurologic: normal gait; normal symmetric reflexes in all extremities; normal sensation in all extremities Psychological: alert and cooperative; normal mood and affect  Allergies  Allergen Reactions  . Phenergan [Promethazine] Other (See Comments)    Hyperactivity and agitation    Past Medical History:  Diagnosis Date  . Elevated liver function tests   . Hepatic cyst   . Hepatitis   . Hepatitis B   . Non Hodgkin's lymphoma [facility])    age [age]   Social History   Socioeconomic History  . Marital status: Married    Spouse name: Not on file  . Number of  children: 3  . Years of education: Not on file  . Highest education level: Not on file  Occupational History  . Occupation: help desk IT  Social Needs  . Financial resource strain: Not on file  . Food insecurity:    Worry: Not on file    Inability: Not on file  . Transportation needs:    Medical: Not on file    Non-medical: Not on file  Tobacco Use  . Smoking status: Former Research scientist (life sciences)  . Smokeless tobacco: Never Used  Substance and Sexual Activity  . Alcohol use: No    Comment: none since admitted to hospital 12/09/15  . Drug use: No    Types: Marijuana    Comment: when travels to [place]; now "done" per pt as of 12/28/15  . Sexual activity: Not on file  Lifestyle  . Physical activity:    Days per week: Not on file    Minutes per session: Not on file  . Stress: Not on file  Relationships  . Social connections:    Talks on phone: Not on file    Gets together: Not on file    Attends religious service: Not on file    Active member of club or organization: Not on file    Attends meetings of clubs or organizations: Not on file    Relationship status: Not on file  Other Topics Concern  . Not on file  Social History Narrative  . Not on file   Family History  Problem Relation Age of Onset  . Heart disease Mother   . Stroke Paternal Grandmother   .  Hepatitis Neg Hx   . Liver disease Neg Hx   . Colon cancer Neg Hx   . Esophageal cancer Neg Hx   . Stomach cancer Neg Hx   . Pancreatic cancer Neg Hx   . Colon polyps Neg Hx    Past Surgical History:  Procedure Laterality Date  . PORT-A-CATH REMOVAL    . PORTACATH PLACEMENT        Vanessa Kick, MD 02/26/18 1112

## 2018-03-01 ENCOUNTER — Other Ambulatory Visit: Payer: Self-pay

## 2018-03-01 DIAGNOSIS — B181 Chronic viral hepatitis B without delta-agent: Secondary | ICD-10-CM

## 2018-04-02 ENCOUNTER — Telehealth: Payer: Self-pay

## 2018-04-02 NOTE — Telephone Encounter (Signed)
-----   Message from Wyatt Haste, RN sent at 03/01/2018 11:53 AM EDT ----- Call patient to remind to do lft, order in epic. Due 11-11

## 2018-04-02 NOTE — Telephone Encounter (Signed)
Left message for patient to come do repeat lab work.

## 2018-07-31 ENCOUNTER — Telehealth: Payer: Self-pay | Admitting: Gastroenterology

## 2018-07-31 NOTE — Telephone Encounter (Signed)
Pt called requesting orders for lab, he just made an appt. with Dr. Loletha Carrow on 3/27 for hep B check up. Pls call him once orders are sent.

## 2018-07-31 NOTE — Telephone Encounter (Signed)
Discussed with pt that the labs he was supposed to have drawn were from October. Pt scheduled follow-up appt with Dr. Loletha Carrow, he will keep appt and then have labs drawn once Dr. Loletha Carrow sees him and decides what labs he may want him to have drawn.

## 2018-08-04 ENCOUNTER — Encounter (HOSPITAL_COMMUNITY): Payer: Self-pay | Admitting: Emergency Medicine

## 2018-08-04 ENCOUNTER — Ambulatory Visit (HOSPITAL_COMMUNITY)
Admission: EM | Admit: 2018-08-04 | Discharge: 2018-08-04 | Disposition: A | Discharge status: Home / Self Care | Payer: BLUE CROSS/BLUE SHIELD | Attending: Family Medicine | Admitting: Family Medicine

## 2018-08-04 ENCOUNTER — Other Ambulatory Visit: Payer: Self-pay

## 2018-08-04 DIAGNOSIS — R509 Fever, unspecified: Secondary | ICD-10-CM | POA: Insufficient documentation

## 2018-08-04 DIAGNOSIS — B181 Chronic viral hepatitis B without delta-agent: Secondary | ICD-10-CM | POA: Diagnosis not present

## 2018-08-04 DIAGNOSIS — Z8572 Personal history of non-Hodgkin lymphomas: Secondary | ICD-10-CM | POA: Diagnosis not present

## 2018-08-04 LAB — COMPREHENSIVE METABOLIC PANEL
ALBUMIN: 3.5 g/dL (ref 3.5–5.0)
ALT: 58 U/L — ABNORMAL HIGH (ref 0–44)
ANION GAP: 5 (ref 5–15)
AST: 46 U/L — ABNORMAL HIGH (ref 15–41)
Alkaline Phosphatase: 61 U/L (ref 38–126)
BUN: 7 mg/dL (ref 6–20)
CALCIUM: 8.4 mg/dL — AB (ref 8.9–10.3)
CHLORIDE: 103 mmol/L (ref 98–111)
CO2: 27 mmol/L (ref 22–32)
Creatinine, Ser: 0.86 mg/dL (ref 0.61–1.24)
GFR calc non Af Amer: >60 mL/min (ref >60)
GLUCOSE: 84 mg/dL (ref 70–99)
POTASSIUM: 4 mmol/L (ref 3.5–5.1)
SODIUM: 135 mmol/L (ref 135–145)
Total Bilirubin: 0.6 mg/dL (ref 0.3–1.2)
Total Protein: 7.3 g/dL (ref 6.5–8.1)

## 2018-08-04 LAB — CBC WITH DIFFERENTIAL/PLATELET
Abs Immature Granulocytes: 0.01 10*3/uL (ref 0.00–0.07)
BASOS ABS: 0 10*3/uL (ref 0.0–0.1)
Basophils Relative: 1 %
EOS ABS: 0.3 10*3/uL (ref 0.0–0.5)
EOS PCT: 5 %
HCT: 43.7 % (ref 39.0–52.0)
HEMOGLOBIN: 14.2 g/dL (ref 13.0–17.0)
Immature Granulocytes: 0 %
LYMPHS ABS: 3.3 10*3/uL (ref 0.7–4.0)
Lymphocytes Relative: 59 %
MCH: 27.3 pg (ref 26.0–34.0)
MCHC: 32.5 g/dL (ref 30.0–36.0)
MCV: 83.9 fL (ref 80.0–100.0)
Monocytes Absolute: 0.3 10*3/uL (ref 0.1–1.0)
Monocytes Relative: 6 %
NEUTROS PCT: 29 %
NRBC: 0 % (ref 0.0–0.2)
Neutro Abs: 1.6 10*3/uL — ABNORMAL LOW (ref 1.7–7.7)
PLATELETS: 156 10*3/uL (ref 150–400)
RBC: 5.21 MIL/uL (ref 4.22–5.81)
RDW: 13 % (ref 11.5–15.5)
WBC: 5.6 10*3/uL (ref 4.0–10.5)

## 2018-08-04 NOTE — ED Triage Notes (Signed)
Pt presents to [facility] for assessment of 1 week of fevers (101 at home), fatigue, headache.  Pt also noted rash with small bumps to both forearms.  Denies cough, denies nasal congestion, denies emesis, diarrhea.

## 2018-08-04 NOTE — Discharge Instructions (Addendum)
Home to rest Continue to drink plenty of fluids Continue to manage your fever  I will Call with lab tests later today

## 2018-08-04 NOTE — ED Provider Notes (Signed)
[place]    CSN: [id number] Arrival date & time: 08/04/18  1021     History   Chief Complaint Chief Complaint  Patient presents with  . Fever    HPI Maurice Lowe is a 38 y.o. male.   HPI  Maurice Lowe states that he has been having fevers every day for the last week.  He states that he has been alternating ibuprofen and Tylenol.  He states that when he takes either medicine his fever will come down and he feels "great".  When the fever spikes back up he feels tired, headache, and general malaise.  No abdominal pain.  No nausea vomiting diarrhea.  No cough cold runny nose.  No sore throat.  He has 2 children at home.  No one is sick.  No exposure to strep, or flu.  No recent travel.  He works in Engineer, technical sales.  He has been working from home. He does have a history of non-Hodgkin's lymphoma treatment when he was 63.  He states that he is cured He does have a history of chronic hepatitis B.  It is theorized that he caught this infection somehow during his lymphoma treatment.  He is currently under treatment for his hepatitis B. He has a history of FUO in the past.  He sometimes has a fever.  Sometimes it is from his hepatitis B activity.  Sometimes it is unknown.  At this point he states that the fever is making him "nervous".  He does not know why he has a fever and wishes evaluation. No urinary symptoms, dysuria, or frequency.  No history of bladder or urinary infections.  Past Medical History:  Diagnosis Date  . Elevated liver function tests   . Hepatic cyst   . Hepatitis   . Hepatitis B   . Non Hodgkin's lymphoma [facility])    age [age]    Patient Active Problem List   Diagnosis Date Noted  . Personal history of non-Hodgkin lymphomas 08/04/2018  . RUQ abdominal pain 12/15/2016  . Chronic viral hepatitis B without delta agent and without coma ([place]) 12/15/2016  . Liver lesion 12/15/2016  . Elevated LFTs     Past Surgical History:  Procedure Laterality Date  .  PORT-A-CATH REMOVAL    . PORTACATH PLACEMENT         Home Medications    Prior to Admission medications   Medication Sig Start Date End Date Taking? Authorizing Provider  entecavir (BARACLUDE) 0.5 MG tablet Take 1 tablet (0.5 mg total) by mouth daily. 02/12/18  Yes Danis, Kirke Corin, MD    Family History Family History  Problem Relation Age of Onset  . Heart disease Mother   . Stroke Paternal Grandmother   . Hepatitis Neg Hx   . Liver disease Neg Hx   . Colon cancer Neg Hx   . Esophageal cancer Neg Hx   . Stomach cancer Neg Hx   . Pancreatic cancer Neg Hx   . Colon polyps Neg Hx     Social History Social History   Tobacco Use  . Smoking status: Former Research scientist (life sciences)  . Smokeless tobacco: Never Used  Substance Use Topics  . Alcohol use: No    Comment: none since admitted to hospital 12/09/15  . Drug use: No    Types: Marijuana    Comment: when travels to [place]; now "done" per pt as of 12/28/15     Allergies   Phenergan [promethazine]   Review of Systems Review of  Systems  Constitutional: Positive for fever. Negative for chills.  HENT: Negative for ear pain and sore throat.   Eyes: Negative for pain and visual disturbance.  Respiratory: Negative for cough and shortness of breath.   Cardiovascular: Negative for chest pain and palpitations.  Gastrointestinal: Negative for abdominal pain and vomiting.  Genitourinary: Negative for dysuria and hematuria.  Musculoskeletal: Negative for arthralgias and back pain.  Skin: Negative for color change and rash.  Neurological: Negative for seizures and syncope.  All other systems reviewed and are negative.    Physical Exam Triage Vital Signs ED Triage Vitals  Enc Vitals Group     BP 08/04/18 1047 113/71     Pulse Rate 08/04/18 1047 93     Resp 08/04/18 1047 16     Temp 08/04/18 1047 98.7 F (37.1 C)     Temp Source 08/04/18 1047 Temporal     SpO2 08/04/18 1047 100 %     Weight --      Height --      Head  Circumference --      Peak Flow --      Pain Score 08/04/18 1048 0     Pain Loc --      Pain Edu? --      Excl. in [place]? --    No data found.  Updated Vital Signs BP 113/71 (BP Location: Right Arm)   Pulse 93   Temp 98.7 F (37.1 C) (Temporal) Comment: has not taken anything today  Resp 16   SpO2 100%   Visual Acuity Right Eye Distance:   Left Eye Distance:   Bilateral Distance:    Right Eye Near:   Left Eye Near:    Bilateral Near:     Physical Exam Constitutional:      General: He is not in acute distress.    Appearance: He is well-developed and normal weight. He is not ill-appearing.  HENT:     Head: Normocephalic and atraumatic.     Right Ear: Tympanic membrane, ear canal and external ear normal.     Left Ear: Tympanic membrane, ear canal and external ear normal.     Nose: Nose normal. No congestion.     Mouth/Throat:     Mouth: Mucous membranes are moist.     Pharynx: No posterior oropharyngeal erythema.  Eyes:     Conjunctiva/sclera: Conjunctivae normal.     Pupils: Pupils are equal, round, and reactive to light.  Neck:     Musculoskeletal: Normal range of motion.  Cardiovascular:     Rate and Rhythm: Normal rate and regular rhythm.     Heart sounds: Normal heart sounds.  Pulmonary:     Effort: Pulmonary effort is normal. No respiratory distress.     Breath sounds: Normal breath sounds.  Abdominal:     General: Abdomen is flat. There is no distension.     Palpations: Abdomen is soft. There is no mass.     Tenderness: There is no abdominal tenderness.  Musculoskeletal: Normal range of motion.        General: No swelling or tenderness.  Lymphadenopathy:     Cervical: No cervical adenopathy.  Skin:    General: Skin is warm and dry.  Neurological:     General: No focal deficit present.     Mental Status: He is alert and oriented to person, place, and time.  Psychiatric:        Mood and Affect: Mood normal.  UC Treatments / Results  Labs (all  labs ordered are listed, but only abnormal results are displayed) Labs Reviewed  CBC WITH DIFFERENTIAL/PLATELET - Abnormal; Notable for the following components:      Result Value   Neutro Abs 1.6 (*)    All other components within normal limits  COMPREHENSIVE METABOLIC PANEL - Abnormal; Notable for the following components:   Calcium 8.4 (*)    AST 46 (*)    ALT 58 (*)    All other components within normal limits   Above labs are reviewed.  No abnormalities identified that would cause any concern.  Mildly elevated liver function tests are an improvement over his last readings. EKG None  Radiology No results found.  Procedures Procedures (including critical care time)  Medications Ordered in UC Medications - No data to display  Initial Impression / Assessment and Plan / UC Course  I have reviewed the triage vital signs and the nursing notes.  Pertinent labs & imaging results that were available during my care of the patient were reviewed by me and considered in my medical decision making (see chart for details).     Patient has had fevers.  No other symptoms.  No physical exam findings. Final Clinical Impressions(s) / UC Diagnoses   Final diagnoses:  Fever, unspecified fever cause  Chronic viral hepatitis B without delta agent and without coma ([place])  Personal history of non-Hodgkin lymphomas     Discharge Instructions     Home to rest Continue to drink plenty of fluids Continue to manage your fever  I will Call with lab tests later today     ED Prescriptions    None     Controlled Substance Prescriptions [place] Controlled Substance Registry consulted? Not Applicable   Raylene Everts, MD 08/04/18 [phone]

## 2018-08-07 ENCOUNTER — Telehealth: Payer: Self-pay

## 2018-08-07 ENCOUNTER — Encounter: Payer: BLUE CROSS/BLUE SHIELD | Admitting: Family Medicine

## 2018-08-07 NOTE — Telephone Encounter (Signed)
Author phoned pt. to reschedule [facility] as provider being reassigned. Appointment rescheduled for 4/6.

## 2018-08-09 ENCOUNTER — Telehealth: Payer: Self-pay | Admitting: Gastroenterology

## 2018-08-09 NOTE — Telephone Encounter (Signed)
Please call Mr. Zyier Dykema at [phone].

## 2018-08-16 ENCOUNTER — Other Ambulatory Visit: Payer: Self-pay

## 2018-08-16 ENCOUNTER — Ambulatory Visit (INDEPENDENT_AMBULATORY_CARE_PROVIDER_SITE_OTHER): Payer: BLUE CROSS/BLUE SHIELD | Admitting: Gastroenterology

## 2018-08-16 DIAGNOSIS — B181 Chronic viral hepatitis B without delta-agent: Secondary | ICD-10-CM | POA: Diagnosis not present

## 2018-08-16 DIAGNOSIS — R748 Abnormal levels of other serum enzymes: Secondary | ICD-10-CM | POA: Diagnosis not present

## 2018-08-16 NOTE — Progress Notes (Signed)
This patient contacted our office requesting a physician telemedicine video consultation regarding clinical questions and/or test results.   Participants on the phone call myself and the patient  The patient consented to phone consultation and was aware that a charge will be placed through their insurance.  I was in my office and the patient was in his home office  Encounter time:  Total time 20 minutes, with 15 minutes spent with patient on phone/webex    Wilfrid Lund, MD   _____________________________________________________________________________________________               [facility] Progress Note  Chief Complaint: Chronic hepatitis B virus  Subjective  History:   When I last saw him in September 2019, he had just been seen in the emergency department with a symptomatic flare of his hepatitis because he had been noncompliant with follow-up and antiviral therapy.  He was also using alcohol regularly at that time.  I put him back on entecavir with the understanding needs to take it faithfully and indefinitely.  He was seen in the emergency department on 08/04/2022 a week of recurrent fevers without any clear localizing symptoms. He was scheduled to have primary care follow-up on 318, but this has been rescheduled to a phone visit on April 6 due to current pandemic related restrictions.  Rielly reports he has been taking his entecavir faithfully since last visit, perhaps missing only several doses since September.  ROS: Cardiovascular:  no chest pain Respiratory: no dyspnea He has been having some headaches and fatigue and has concerns that he might have sleep apnea because his wife says he snores at night.  He has not yet been able to get in with primary care about this.  The patient's Past Medical, Family and Social History were reviewed and are on file in the EMR.  Objective:  Med list reviewed  Current Outpatient Medications:  .  entecavir (BARACLUDE) 0.5  MG tablet, Take 1 tablet (0.5 mg total) by mouth daily., Disp: 30 tablet, Rfl: 11    Recent Labs:  CBC Latest Ref Rng & Units 08/04/2018 02/08/2018 02/05/2018  WBC 4.0 - 10.5 K/uL 5.6 3.2(L) 3.3(L)  Hemoglobin 13.0 - 17.0 g/dL 14.2 12.7(L) 13.7  Hematocrit 39.0 - 52.0 % 43.7 38.3(L) 41.3  Platelets 150 - 400 K/uL 156 71(L) 92(L)   CMP Latest Ref Rng & Units 08/04/2018 02/26/2018 02/12/2018  Glucose 70 - 99 mg/dL 84 - -  BUN 6 - 20 mg/dL 7 - -  Creatinine 0.61 - 1.24 mg/dL 0.86 - -  Sodium 135 - 145 mmol/L 135 - -  Potassium 3.5 - 5.1 mmol/L 4.0 - -  Chloride 98 - 111 mmol/L 103 - -  CO2 22 - 32 mmol/L 27 - -  Calcium 8.9 - 10.3 mg/dL 8.4(L) - -  Total Protein 6.5 - 8.1 g/dL 7.3 8.8(H) 8.4(H)  Total Bilirubin 0.3 - 1.2 mg/dL 0.6 0.6 1.4(H)  Alkaline Phos 38 - 126 U/L 61 78 97  AST 15 - 41 U/L 46(H) 93(H) 354(H)  ALT 0 - 44 U/L 58(H) 114(H) 789(H)   924/19 HBV DNA 26,600   Radiology:  IMPRESSION: 1. No definite acute findings. There is mild gallbladder sludge and pericholecystic fluid, although no gallbladder wall thickening, sonographic Murphy's sign or biliary dilatation. 2. Indeterminate lesion in the left hepatic lobe has not significantly changed in size from MRI 13 months ago. No enlarging hepatic lesions identified.     Electronically Signed   By: Richardean Sale  M.D.   On: 02/08/2018 19:12   ABDOMEN ULTRASOUND COMPLETE   COMPARISON:  Right upper quadrant abdominal ultrasound and CT abdomen 12/05/2016. MRI 12/26/2016.   FINDINGS: Gallbladder: Gallbladder sludge and mild pericholecystic fluid are noted. No gallstones, gallbladder wall thickening or sonographic Percell Miller sign (confirmed in discussion with the technologist).   Common bile duct: Diameter: 3 mm   Liver: Heterogeneous lesion is noted in the left hepatic lobe, measuring 2.6 x 2.0 x 2.6 cm. This has not significantly changed in size from the previous MRI. No new or enlarging hepatic lesions are  identified. Portal vein is patent on color Doppler imaging with normal direction of blood flow towards the liver.   IVC: No abnormality visualized.   Pancreas: Visualized portion unremarkable.   Spleen: Size and appearance within normal limits.   Right Kidney: Length: 11.0 cm. Echogenicity within normal limits. No mass or hydronephrosis visualized.   Left Kidney: Length: 11.4 cm. Echogenicity within normal limits. No mass or hydronephrosis visualized.   Abdominal aorta: No aneurysm visualized.   Other findings: None.   IMPRESSION: 1. No definite acute findings. There is mild gallbladder sludge and pericholecystic fluid, although no gallbladder wall thickening, sonographic Murphy's sign or biliary dilatation. 2. Indeterminate lesion in the left hepatic lobe has not significantly changed in size from MRI 13 months ago. No enlarging hepatic lesions identified.     Electronically Signed   By: Richardean Sale M.D.   On: 02/08/2018 19:12  @ASSESSMENTPLANBEGIN @ Assessment: Encounter Diagnoses  Name Primary?  . Chronic viral hepatitis B without delta agent and without coma ([place]) Yes  . Abnormal transaminases    Low level transaminitis and chronic hepatitis B.  Low level viremia in September, but he had only just resumed the medicine at that time.   Plan: Hepatitis B viral DNA and alpha-fetoprotein along with screening right upper quadrant ultrasound in 4 to 6 weeks, pending social restrictions due to COVID-19. Continue current dose of entecavir 0.5 mg daily Clinical reminder to see me in 6 months.  Maurice Lowe

## 2018-08-26 ENCOUNTER — Encounter: Payer: BLUE CROSS/BLUE SHIELD | Admitting: Family Medicine

## 2018-11-18 ENCOUNTER — Encounter: Payer: BLUE CROSS/BLUE SHIELD | Admitting: Family Medicine

## 2019-02-27 ENCOUNTER — Other Ambulatory Visit: Payer: Self-pay | Admitting: Gastroenterology

## 2019-03-03 ENCOUNTER — Other Ambulatory Visit: Payer: Self-pay | Admitting: Gastroenterology

## 2019-03-26 ENCOUNTER — Other Ambulatory Visit (INDEPENDENT_AMBULATORY_CARE_PROVIDER_SITE_OTHER): Payer: BC Managed Care – PPO

## 2019-03-26 ENCOUNTER — Encounter: Payer: Self-pay | Admitting: Gastroenterology

## 2019-03-26 ENCOUNTER — Ambulatory Visit (INDEPENDENT_AMBULATORY_CARE_PROVIDER_SITE_OTHER): Payer: BC Managed Care – PPO | Admitting: Gastroenterology

## 2019-03-26 ENCOUNTER — Other Ambulatory Visit: Payer: Self-pay

## 2019-03-26 VITALS — BP 120/80 | HR 75 | Temp 98.3°F | Ht 66.0 in | Wt 157.0 lb

## 2019-03-26 DIAGNOSIS — B181 Chronic viral hepatitis B without delta-agent: Secondary | ICD-10-CM | POA: Diagnosis not present

## 2019-03-26 LAB — COMPREHENSIVE METABOLIC PANEL
ALT: 31 U/L (ref 0–53)
AST: 25 U/L (ref 0–37)
Albumin: 4.5 g/dL (ref 3.5–5.2)
Alkaline Phosphatase: 58 U/L (ref 39–117)
BUN: 13 mg/dL (ref 6–23)
CO2: 30 mEq/L (ref 19–32)
Calcium: 9.2 mg/dL (ref 8.4–10.5)
Chloride: 102 mEq/L (ref 96–112)
Creatinine, Ser: 1 mg/dL (ref 0.40–1.50)
GFR: 100.48 mL/min (ref >60.00)
Glucose, Bld: 95 mg/dL (ref 70–99)
Potassium: 4.3 mEq/L (ref 3.5–5.1)
Sodium: 138 mEq/L (ref 135–145)
Total Bilirubin: 0.5 mg/dL (ref 0.2–1.2)
Total Protein: 8.1 g/dL (ref 6.0–8.3)

## 2019-03-26 LAB — CBC WITH DIFFERENTIAL/PLATELET
Basophils Absolute: 0 10*3/uL (ref 0.0–0.1)
Basophils Relative: 0.5 % (ref 0.0–3.0)
Eosinophils Absolute: 0.1 10*3/uL (ref 0.0–0.7)
Eosinophils Relative: 1.4 % (ref 0.0–5.0)
HCT: 44.5 % (ref 39.0–52.0)
Hemoglobin: 14.7 g/dL (ref 13.0–17.0)
Lymphocytes Relative: 45.3 % (ref 12.0–46.0)
Lymphs Abs: 2.1 10*3/uL (ref 0.7–4.0)
MCHC: 33.1 g/dL (ref 30.0–36.0)
MCV: 86.9 fl (ref 78.0–100.0)
Monocytes Absolute: 0.5 10*3/uL (ref 0.1–1.0)
Monocytes Relative: 10.6 % (ref 3.0–12.0)
Neutro Abs: 1.9 10*3/uL (ref 1.4–7.7)
Neutrophils Relative %: 42.2 % — ABNORMAL LOW (ref 43.0–77.0)
Platelets: 166 10*3/uL (ref 150.0–400.0)
RBC: 5.12 Mil/uL (ref 4.22–5.81)
RDW: 14.3 % (ref 11.5–15.5)
WBC: 4.6 10*3/uL (ref 4.0–10.5)

## 2019-03-26 NOTE — Progress Notes (Signed)
Maurice Lowe GI Progress Note  Chief Complaint: Chronic hepatitis B virus.  Subjective  History: Chronic hepatitis B virus infection on entecavir, there have been periods of nonadherence to therapy, and also periods of excess alcohol use..  I last saw him by telemedicine in March of this year.  Low level viremia September 2019, but patient had only just resumed his medicine at that time after a flare when he was off entecavir. After most recent visit, my plans were a hepatitis B viral DNA and AFP with a screening ultrasound, however that did not happen, perhaps related to COVID-19 restrictions at that time.  Maurice Lowe tells me that he is feeling very well these days.  He has also been taking his medication faithfully.  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  He denies abdominal pain, nausea vomiting early satiety weight loss.  He has been bothered by morning headaches for quite some time, and is now concerned it may be due to sleep apnea.  His wife tells him that he snores and also periodically seems to stop breathing at night.  ROS: Cardiovascular:  no chest pain Respiratory: no dyspnea Remainder of systems negative except as above  The patient's Past Medical, Family and Social History were reviewed and are on file in the EMR.  Objective:  Med list reviewed  Current Outpatient Medications:  .  entecavir (BARACLUDE) 0.5 MG tablet, TAKE 1 TABLET BY MOUTH DAILY, Disp: 30 tablet, Rfl: 0   Vital signs in last 24 hrs: Vitals:   03/26/19 0827  BP: 120/80  Pulse: 75  Temp: 98.3 F (36.8 C)    Physical Exam  Well-appearing  HEENT: sclera anicteric, oral mucosa moist without lesions  Neck: supple, no thyromegaly, JVD or lymphadenopathy  Cardiac: RRR without murmurs, S1S2 heard, no peripheral edema  Pulm: clear to auscultation bilaterally, normal  RR and effort noted  Abdomen: soft, no tenderness, with active bowel sounds. No guarding or palpable hepatosplenomegaly.  No distention, bulging flanks or other clinical stigmata of liver disease  Skin; warm and dry, no jaundice or rash   Radiologic studies: Last ultrasound September 2019 as follows:  ABDOMEN ULTRASOUND COMPLETE   COMPARISON:  Right upper quadrant abdominal ultrasound and CT abdomen 12/05/2016. MRI 12/26/2016.   FINDINGS: Gallbladder: Gallbladder sludge and mild pericholecystic fluid are noted. No gallstones, gallbladder wall thickening or sonographic Percell Miller sign (confirmed in discussion with the technologist).   Common bile duct: Diameter: 3 mm   Liver: Heterogeneous lesion is noted in the left hepatic lobe, measuring 2.6 x 2.0 x 2.6 cm. This has not significantly changed in size from the previous MRI. No new or enlarging hepatic lesions are identified. Portal vein is patent on color Doppler imaging with normal direction of blood flow towards the liver.   IVC: No abnormality visualized.   Pancreas: Visualized portion unremarkable.   Spleen: Size and appearance within normal limits.   Right Kidney: Length: 11.0 cm. Echogenicity within normal limits. No mass or hydronephrosis visualized.   Left Kidney: Length: 11.4 cm. Echogenicity within normal limits. No mass or hydronephrosis visualized.   Abdominal aorta: No aneurysm visualized.   Other findings: None.   IMPRESSION: 1. No definite acute findings. There is mild gallbladder sludge and pericholecystic fluid, although no gallbladder wall thickening, sonographic Murphy's sign or biliary dilatation. 2. Indeterminate lesion in the  left hepatic lobe has not significantly changed in size from MRI 13 months ago. No enlarging hepatic lesions identified.     Electronically Signed   By: Richardean Sale M.D.   On: 02/08/2018 19:12    @ASSESSMENTPLANBEGIN @ Assessment: Encounter Diagnosis  Name  Primary?  . Chronic hepatitis B virus infection ([place]) Yes   He is feeling well, reportedly adherent to daily therapy.  He has also not been drinking alcohol.  Maurice Lowe does have symptoms highly suspicious for sleep apnea.  He had been unable to get an appointment with his primary care provider order to address this months ago with Covid restrictions, so I will message Dr. [name] in hopes that she will consider ordering a sleep study.  Plan: Labs today: Hepatitis B quantitative viral load by PCR, CBC, CMP, AFP  Right upper quadrant ultrasound to screen for liver mass.   Total time 25 minutes, over half spent face-to-face with patient in counseling and coordination of care.   Nelida Meuse III

## 2019-03-26 NOTE — Patient Instructions (Signed)
If you are age 39 or older, your body mass index should be between 23-30. Your Body mass index is 25.34 kg/m. If this is out of the aforementioned range listed, please consider follow up with your Primary Care Provider.  If you are age 75 or younger, your body mass index should be between 19-25. Your Body mass index is 25.34 kg/m. If this is out of the aformentioned range listed, please consider follow up with your Primary Care Provider.   Your provider has requested that you go to the basement level for lab work before leaving today. Press "B" on the elevator. The lab is located at the first door on the left as you exit the elevator.  You have been scheduled for an abdominal ultrasound at [facility] (1st floor of hospital) on 03-31-2019 at 10am. Please arrive 15 minutes prior to your appointment for registration. Make certain not to have anything to eat or drink 6 hours prior to your appointment. Should you need to reschedule your appointment, please contact radiology at [phone]. This test typically takes about 30 minutes to perform.  It was a pleasure to see you today!  Dr. Loletha Carrow

## 2019-03-31 ENCOUNTER — Ambulatory Visit (HOSPITAL_COMMUNITY)
Admission: RE | Admit: 2019-03-31 | Discharge: 2019-03-31 | Disposition: A | Discharge status: Home / Self Care | Payer: BC Managed Care – PPO | Source: Ambulatory Visit | Attending: Gastroenterology | Admitting: Gastroenterology

## 2019-03-31 ENCOUNTER — Other Ambulatory Visit: Payer: Self-pay

## 2019-03-31 DIAGNOSIS — B181 Chronic viral hepatitis B without delta-agent: Secondary | ICD-10-CM | POA: Insufficient documentation

## 2019-03-31 LAB — AFP TUMOR MARKER: AFP-Tumor Marker: 5.8 ng/mL (ref <6.1)

## 2019-03-31 LAB — HEPATITIS B DNA, ULTRAQUANTITATIVE, PCR
Hepatitis B DNA (Calc): 1.06 Log IU/mL — ABNORMAL HIGH
Hepatitis B DNA: 12 IU/mL — ABNORMAL HIGH

## 2019-04-01 ENCOUNTER — Encounter: Payer: Self-pay | Admitting: *Deleted

## 2019-04-24 ENCOUNTER — Other Ambulatory Visit: Payer: Self-pay | Admitting: Gastroenterology

## 2019-05-06 ENCOUNTER — Other Ambulatory Visit: Payer: Self-pay | Admitting: Gastroenterology

## 2019-06-19 ENCOUNTER — Encounter (HOSPITAL_COMMUNITY): Payer: Self-pay | Admitting: Emergency Medicine

## 2019-06-19 ENCOUNTER — Ambulatory Visit (HOSPITAL_COMMUNITY)
Admission: EM | Admit: 2019-06-19 | Discharge: 2019-06-19 | Disposition: A | Discharge status: Home / Self Care | Payer: BC Managed Care – PPO | Attending: Family Medicine | Admitting: Family Medicine

## 2019-06-19 ENCOUNTER — Telehealth: Payer: BC Managed Care – PPO

## 2019-06-19 ENCOUNTER — Other Ambulatory Visit: Payer: Self-pay

## 2019-06-19 DIAGNOSIS — R1013 Epigastric pain: Secondary | ICD-10-CM | POA: Diagnosis not present

## 2019-06-19 DIAGNOSIS — Z886 Allergy status to analgesic agent status: Secondary | ICD-10-CM | POA: Diagnosis not present

## 2019-06-19 MED ORDER — OMEPRAZOLE 20 MG PO CPDR: DELAYED_RELEASE_CAPSULE | ORAL | 0 refills | Status: DC

## 2019-06-19 NOTE — Discharge Instructions (Signed)
Take the omeprazole 2 x a day for one week Then once a day until gone Take on empty stomach You may use pepto bismol as needed Follow up with your PCP

## 2019-06-19 NOTE — ED Provider Notes (Signed)
[place]    CSN: [id number] Arrival date & time: 06/19/19  1358      History   Chief Complaint Chief Complaint  Patient presents with  . Abdominal Pain    HPI Maurice Lowe is a 39 y.o. male.   HPI  Patient is here for epigastric pain.  He states that he had a bad headache 5 days ago.  He took an ibuprofen 800 mg on an empty stomach.  Woke up the next morning with heartburn.  Is persisted ever since then.  Has pain in his epigastrium.  He has been on a bland diet.  It hurts worse in the morning and better throughout the day.  Past Medical History:  Diagnosis Date  . Elevated liver function tests   . Hepatic cyst   . Hepatitis   . Hepatitis B   . Non Hodgkin's lymphoma [facility])    age [age]    Patient Active Problem List   Diagnosis Date Noted  . Personal history of non-Hodgkin lymphomas 08/04/2018  . RUQ abdominal pain 12/15/2016  . Chronic viral hepatitis B without delta agent and without coma ([place]) 12/15/2016  . Liver lesion 12/15/2016  . Elevated LFTs     Past Surgical History:  Procedure Laterality Date  . PORT-A-CATH REMOVAL    . PORTACATH PLACEMENT         Home Medications    Prior to Admission medications   Medication Sig Start Date End Date Taking? Authorizing Provider  entecavir (BARACLUDE) 0.5 MG tablet TAKE 1 TABLET BY MOUTH DAILY 05/06/19   Doran Stabler, MD  omeprazole (PRILOSEC) 20 MG capsule Take 2 x a day for 7 days then once a day until gone 06/19/19   Raylene Everts, MD    Family History Family History  Problem Relation Age of Onset  . Heart disease Mother   . Stroke Paternal Grandmother   . Hepatitis Neg Hx   . Liver disease Neg Hx   . Colon cancer Neg Hx   . Esophageal cancer Neg Hx   . Stomach cancer Neg Hx   . Pancreatic cancer Neg Hx   . Colon polyps Neg Hx     Social History Social History   Tobacco Use  . Smoking status: Former Research scientist (life sciences)  . Smokeless tobacco: Never Used  Substance Use Topics    . Alcohol use: No    Comment: none since admitted to hospital 12/09/15  . Drug use: No    Types: Marijuana    Comment: when travels to [place]; now "done" per pt as of 12/28/15     Allergies   Phenergan [promethazine]   Review of Systems Review of Systems  Gastrointestinal: Positive for abdominal pain. Negative for blood in stool, constipation, nausea and vomiting.     Physical Exam Triage Vital Signs ED Triage Vitals [06/19/19 1421]  Enc Vitals Group     BP (!) 159/88     Pulse Rate 78     Resp 18     Temp 98.5 F (36.9 C)     Temp Source Oral     SpO2 95 %     Weight      Height      Head Circumference      Peak Flow      Pain Score 4     Pain Loc      Pain Edu?      Excl. in [place]?    No data found.  Updated Vital Signs BP (!) 159/88 (BP Location: Left Arm)   Pulse 78   Temp 98.5 F (36.9 C) (Oral)   Resp 18   SpO2 95%      Physical Exam Constitutional:      General: He is not in acute distress.    Appearance: He is well-developed and normal weight.  HENT:     Head: Normocephalic and atraumatic.  Eyes:     Conjunctiva/sclera: Conjunctivae normal.     Pupils: Pupils are equal, round, and reactive to light.  Cardiovascular:     Rate and Rhythm: Normal rate.     Comments: Mask in place Pulmonary:     Effort: Pulmonary effort is normal. No respiratory distress.  Abdominal:     General: There is no distension.     Palpations: Abdomen is soft. There is no hepatomegaly or splenomegaly.     Tenderness: There is abdominal tenderness in the epigastric area. There is no guarding.  Musculoskeletal:        General: Normal range of motion.     Cervical back: Normal range of motion.  Skin:    General: Skin is warm and dry.  Neurological:     General: No focal deficit present.     Mental Status: He is alert.  Psychiatric:        Mood and Affect: Mood normal.        Behavior: Behavior normal.      UC Treatments / Results  Labs (all labs ordered are  listed, but only abnormal results are displayed) Labs Reviewed - No data to display  EKG   Radiology No results found.  Procedures Procedures (including critical care time)  Medications Ordered in UC Medications - No data to display  Initial Impression / Assessment and Plan / UC Course  I have reviewed the triage vital signs and the nursing notes.  Pertinent labs & imaging results that were available during my care of the patient were reviewed by me and considered in my medical decision making (see chart for details).     Reviewed dietary recommendations.  How to take omeprazole. Final Clinical Impressions(s) / UC Diagnoses   Final diagnoses:  NSAID sensitivity     Discharge Instructions     Take the omeprazole 2 x a day for one week Then once a day until gone Take on empty stomach You may use pepto bismol as needed Follow up with your PCP   ED Prescriptions    Medication Sig Dispense Auth. Provider   omeprazole (PRILOSEC) 20 MG capsule Take 2 x a day for 7 days then once a day until gone 30 capsule Meda Coffee Jennette Banker, MD     PDMP not reviewed this encounter.   Raylene Everts, MD 06/19/19 [phone]

## 2019-06-19 NOTE — ED Triage Notes (Addendum)
Pt here for abd pain and irritation x 5 days after taking ibuprofen

## 2019-07-03 ENCOUNTER — Encounter (HOSPITAL_COMMUNITY): Payer: Self-pay | Admitting: Emergency Medicine

## 2019-07-03 ENCOUNTER — Other Ambulatory Visit: Payer: Self-pay

## 2019-07-03 ENCOUNTER — Emergency Department (HOSPITAL_COMMUNITY)
Admission: EM | Admit: 2019-07-03 | Discharge: 2019-07-04 | Disposition: A | Discharge status: Home / Self Care | Payer: BC Managed Care – PPO | Attending: Emergency Medicine | Admitting: Emergency Medicine

## 2019-07-03 DIAGNOSIS — Z79899 Other long term (current) drug therapy: Secondary | ICD-10-CM | POA: Insufficient documentation

## 2019-07-03 DIAGNOSIS — R160 Hepatomegaly, not elsewhere classified: Secondary | ICD-10-CM

## 2019-07-03 DIAGNOSIS — Z8572 Personal history of non-Hodgkin lymphomas: Secondary | ICD-10-CM | POA: Insufficient documentation

## 2019-07-03 DIAGNOSIS — Z87891 Personal history of nicotine dependence: Secondary | ICD-10-CM | POA: Insufficient documentation

## 2019-07-03 DIAGNOSIS — K7689 Other specified diseases of liver: Secondary | ICD-10-CM | POA: Diagnosis not present

## 2019-07-03 DIAGNOSIS — R1011 Right upper quadrant pain: Secondary | ICD-10-CM | POA: Diagnosis not present

## 2019-07-03 DIAGNOSIS — R519 Headache, unspecified: Secondary | ICD-10-CM | POA: Diagnosis not present

## 2019-07-03 DIAGNOSIS — R1013 Epigastric pain: Secondary | ICD-10-CM | POA: Diagnosis not present

## 2019-07-03 LAB — CBC
HCT: 42.8 % (ref 39.0–52.0)
Hemoglobin: 14.3 g/dL (ref 13.0–17.0)
MCH: 28.3 pg (ref 26.0–34.0)
MCHC: 33.4 g/dL (ref 30.0–36.0)
MCV: 84.8 fL (ref 80.0–100.0)
Platelets: 196 10*3/uL (ref 150–400)
RBC: 5.05 MIL/uL (ref 4.22–5.81)
RDW: 12.3 % (ref 11.5–15.5)
WBC: 8 10*3/uL (ref 4.0–10.5)
nRBC: 0 % (ref 0.0–0.2)

## 2019-07-03 LAB — URINALYSIS, ROUTINE W REFLEX MICROSCOPIC
Bilirubin Urine: NEGATIVE
Glucose, UA: NEGATIVE mg/dL
Hgb urine dipstick: NEGATIVE
Ketones, ur: NEGATIVE mg/dL
Leukocytes,Ua: NEGATIVE
Nitrite: NEGATIVE
Protein, ur: NEGATIVE mg/dL
Specific Gravity, Urine: 1.01 (ref 1.005–1.030)
pH: 6 (ref 5.0–8.0)

## 2019-07-03 MED ORDER — SODIUM CHLORIDE 0.9% FLUSH
3.0000 mL | Freq: Once | INTRAVENOUS | Status: AC
  Administered 2019-07-04: 05:00:00 3 mL via INTRAVENOUS

## 2019-07-03 NOTE — ED Triage Notes (Signed)
Patient reports upper abdominal pain onset last week , denies emesis or diarrhea , no fever or chills .

## 2019-07-04 ENCOUNTER — Emergency Department (HOSPITAL_COMMUNITY): Payer: BC Managed Care – PPO

## 2019-07-04 DIAGNOSIS — K7460 Unspecified cirrhosis of liver: Secondary | ICD-10-CM | POA: Diagnosis not present

## 2019-07-04 LAB — COMPREHENSIVE METABOLIC PANEL
ALT: 29 U/L (ref 0–44)
AST: 45 U/L — ABNORMAL HIGH (ref 15–41)
Albumin: 4.1 g/dL (ref 3.5–5.0)
Alkaline Phosphatase: 75 U/L (ref 38–126)
Anion gap: 11 (ref 5–15)
BUN: 6 mg/dL (ref 6–20)
CO2: 24 mmol/L (ref 22–32)
Calcium: 9.4 mg/dL (ref 8.9–10.3)
Chloride: 101 mmol/L (ref 98–111)
Creatinine, Ser: 0.91 mg/dL (ref 0.61–1.24)
GFR calc Af Amer: >60 mL/min (ref >60)
GFR calc non Af Amer: >60 mL/min (ref >60)
Glucose, Bld: 96 mg/dL (ref 70–99)
Potassium: 3.9 mmol/L (ref 3.5–5.1)
Sodium: 136 mmol/L (ref 135–145)
Total Bilirubin: 1.2 mg/dL (ref 0.3–1.2)
Total Protein: 7.9 g/dL (ref 6.5–8.1)

## 2019-07-04 LAB — LIPASE, BLOOD: Lipase: 39 U/L (ref 11–51)

## 2019-07-04 MED ORDER — MORPHINE SULFATE (PF) 4 MG/ML IV SOLN
4.0000 mg | Freq: Once | INTRAVENOUS | Status: AC
  Administered 2019-07-04: 05:00:00 4 mg via INTRAVENOUS
  Filled 2019-07-04: qty 1

## 2019-07-04 MED ORDER — PANTOPRAZOLE SODIUM 40 MG PO TBEC: 40.0000 mg | DELAYED_RELEASE_TABLET | Freq: Every day | ORAL | 0 refills | Status: DC

## 2019-07-04 MED ORDER — ALUM & MAG HYDROXIDE-SIMETH 200-200-20 MG/5ML PO SUSP
30.0000 mL | Freq: Once | ORAL | Status: AC
  Administered 2019-07-04: 06:00:00 30 mL via ORAL
  Filled 2019-07-04: qty 30

## 2019-07-04 MED ORDER — PANTOPRAZOLE SODIUM 40 MG PO TBEC
40.0000 mg | DELAYED_RELEASE_TABLET | Freq: Once | ORAL | Status: AC
  Administered 2019-07-04: 40 mg via ORAL
  Filled 2019-07-04: qty 1

## 2019-07-04 MED ORDER — SUCRALFATE 1 G PO TABS: 1.0000 g | ORAL_TABLET | Freq: Three times a day (TID) | ORAL | 0 refills | Status: DC

## 2019-07-04 NOTE — ED Notes (Signed)
Patient was able to drink a can of ginger ale with no difficulties successfully completing the PO challenge

## 2019-07-04 NOTE — ED Provider Notes (Signed)
[facility] EMERGENCY DEPARTMENT Provider Note   CSN: [id number] Arrival date & time: 07/03/19  2317     History Chief Complaint  Patient presents with  . Abdominal Pain    Ishak Keast is a 39 y.o. male with a hx of hepatic cyst, chronic hepatitis B, non-Hodgkin's lymphoma in remission approximately 20 years, elevated LFTs presents to the Emergency Department complaining of gradual, persistent, progressively worsening gastric and right upper quadrant abdominal pain onset approximately 2 weeks ago.  Patient reports it started after taking naproxen on an empty stomach for headache.  He reports he regularly takes naproxen for headaches.  He reports he had symptoms of acid reflux and epigastric pain.  He was evaluated at urgent care and given a prescription for omeprazole.  Patient reports he has been taking the medication but it has not been significantly helping.  He reports severe pain after eating which improves and sometimes resolves when his stomach is empty.  He denies nausea, vomiting, diarrhea, fever, chills, weight loss, weakness, dizziness, syncope.  No known sick contacts.  No questionable foods.    The history is provided by the patient and medical records. No language interpreter was used.       Past Medical History:  Diagnosis Date  . Elevated liver function tests   . Hepatic cyst   . Hepatitis   . Hepatitis B   . Non Hodgkin's lymphoma [facility])    age [age]    Patient Active Problem List   Diagnosis Date Noted  . Personal history of non-Hodgkin lymphomas 08/04/2018  . RUQ abdominal pain 12/15/2016  . Chronic viral hepatitis B without delta agent and without coma ([place]) 12/15/2016  . Liver lesion 12/15/2016  . Elevated LFTs     Past Surgical History:  Procedure Laterality Date  . PORT-A-CATH REMOVAL    . PORTACATH PLACEMENT         Family History  Problem Relation Age of Onset  . Heart disease Mother   . Stroke Paternal Grandmother    . Hepatitis Neg Hx   . Liver disease Neg Hx   . Colon cancer Neg Hx   . Esophageal cancer Neg Hx   . Stomach cancer Neg Hx   . Pancreatic cancer Neg Hx   . Colon polyps Neg Hx     Social History   Tobacco Use  . Smoking status: Former Research scientist (life sciences)  . Smokeless tobacco: Never Used  Substance Use Topics  . Alcohol use: No    Comment: none since admitted to hospital 12/09/15  . Drug use: No    Types: Marijuana    Comment: when travels to [place]; now "done" per pt as of 12/28/15    Home Medications Prior to Admission medications   Medication Sig Start Date End Date Taking? Authorizing Provider  entecavir (BARACLUDE) 0.5 MG tablet TAKE 1 TABLET BY MOUTH DAILY 05/06/19   Doran Stabler, MD  pantoprazole (PROTONIX) 40 MG tablet Take 1 tablet (40 mg total) by mouth daily. 07/04/19   Macallister Ashmead, Jarrett Soho, PA-C  sucralfate (CARAFATE) 1 g tablet Take 1 tablet (1 g total) by mouth 4 (four) times daily -  with meals and at bedtime. 07/04/19   Katieann Hungate, Jarrett Soho, PA-C  omeprazole (PRILOSEC) 20 MG capsule Take 2 x a day for 7 days then once a day until gone 06/19/19 07/04/19  Raylene Everts, MD    Allergies    Phenergan [promethazine]  Review of Systems   Review of Systems  Constitutional: Negative for appetite change, diaphoresis, fatigue, fever and unexpected weight change.  HENT: Negative for mouth sores.   Eyes: Negative for visual disturbance.  Respiratory: Negative for cough, chest tightness, shortness of breath and wheezing.   Cardiovascular: Negative for chest pain.  Gastrointestinal: Positive for abdominal pain. Negative for constipation, diarrhea, nausea and vomiting.  Endocrine: Negative for polydipsia, polyphagia and polyuria.  Genitourinary: Negative for dysuria, frequency, hematuria and urgency.  Musculoskeletal: Negative for back pain and neck stiffness.  Skin: Negative for rash.  Allergic/Immunologic: Negative for immunocompromised state.  Neurological: Negative for  syncope, light-headedness and headaches.  Hematological: Does not bruise/bleed easily.  Psychiatric/Behavioral: Negative for sleep disturbance. The patient is not nervous/anxious.     Physical Exam Updated Vital Signs BP 112/72 (BP Location: Right Arm)   Pulse 66   Temp 99.2 F (37.3 C) (Oral)   Resp 16   SpO2 100%   Physical Exam Vitals and nursing note reviewed.  Constitutional:      General: He is not in acute distress.    Appearance: He is not diaphoretic.  HENT:     Head: Normocephalic.  Eyes:     General: No scleral icterus.    Conjunctiva/sclera: Conjunctivae normal.  Cardiovascular:     Rate and Rhythm: Normal rate and regular rhythm.     Pulses: Normal pulses.          Radial pulses are 2+ on the right side and 2+ on the left side.  Pulmonary:     Effort: No tachypnea, accessory muscle usage, prolonged expiration, respiratory distress or retractions.     Breath sounds: No stridor.     Comments: Equal chest rise. No increased work of breathing. Abdominal:     General: There is no distension.     Palpations: Abdomen is soft.     Tenderness: There is abdominal tenderness in the right upper quadrant and epigastric area. There is guarding. There is no right CVA tenderness, left CVA tenderness or rebound.  Musculoskeletal:     Cervical back: Normal range of motion.     Comments: Moves all extremities equally and without difficulty.  Skin:    General: Skin is warm and dry.     Capillary Refill: Capillary refill takes less than 2 seconds.  Neurological:     Mental Status: He is alert.     GCS: GCS eye subscore is 4. GCS verbal subscore is 5. GCS motor subscore is 6.     Comments: Speech is clear and goal oriented.  Psychiatric:        Mood and Affect: Mood normal.     ED Results / Procedures / Treatments   Labs (all labs ordered are listed, but only abnormal results are displayed) Labs Reviewed  COMPREHENSIVE METABOLIC PANEL - Abnormal; Notable for the  following components:      Result Value   AST 45 (*)    All other components within normal limits  LIPASE, BLOOD  CBC  URINALYSIS, ROUTINE W REFLEX MICROSCOPIC    Radiology US Abdomen Limited  Result Date: 07/04/2019 CLINICAL DATA:  Right upper quadrant pain EXAM: ULTRASOUND ABDOMEN LIMITED RIGHT UPPER QUADRANT COMPARISON:  03/31/2019 FINDINGS: Gallbladder: No gallstones or wall thickening visualized. No sonographic Murphy sign noted by sonographer. Common bile duct: Diameter: 2 mm Liver: Heterogeneous echotexture diffusely with lobulated appearance of the liver surface. Even greater heterogeneity at the level of the segment 4 B, where there was probable perfusion anomaly and clustered low-density lesions on 2018 CT.  Measurements were provided on the worksheet for this area but discrete and accurate measurement is not confidently acquired today. Patent and antegrade flow in the main portal vein. IMPRESSION: 1. Negative gallbladder. 2. Cirrhotic appearance of the liver with indistinct masslike area centered at segment 4 B, which has been seen on prior imaging as well. Recommend GI follow-up and liver MRI. Electronically Signed   By: Monte Fantasia M.D.   On: 07/04/2019 05:20    Procedures Procedures (including critical care time)  Medications Ordered in ED Medications  sodium chloride flush (NS) 0.9 % injection 3 mL (3 mLs Intravenous Given 07/04/19 0450)  morphine 4 MG/ML injection 4 mg (4 mg Intravenous Given 07/04/19 0449)  pantoprazole (PROTONIX) EC tablet 40 mg (40 mg Oral Given 07/04/19 0449)  alum & mag hydroxide-simeth (MAALOX/MYLANTA) 200-200-20 MG/5ML suspension 30 mL (30 mLs Oral Given 07/04/19 [id number])    ED Course  I have reviewed the triage vital signs and the nursing notes.  Pertinent labs & imaging results that were available during my care of the patient were reviewed by me and considered in my medical decision making (see chart for details).    MDM Rules/Calculators/A&P                       Patient presents with epigastric and right upper quadrant abdominal pain.  He has a history of hepatitis.  Suspect NSAID gastritis however patient reports this feels less like previous episodes of gastritis/ulcer disease and more like 2019 when they found his hepatic cyst.  Patient reports he otherwise feels well.  We will give pain control and obtain ultrasound.  Labs are overall reassuring.  Minimally elevated LFTs with an AST of 45.  ALT is within normal limits.  Significantly improved from 2019.  All of the labs are reassuring.    6:20 AM Ultrasound with Cirrhotic appearance of the liver with indistinct masslike area.  Patient's pain is well controlled at this time and he has been able to tolerate liquids without vomiting or pain.   Presentation was most consistent with gastritis/potential peptic ulcer disease.  His omeprazole was changed to Protonix and Carafate was added.  Given his history of chronic hepatitis and hepatic cyst an ultrasound was obtained.  He does have a persistent mass and radiology recommended liver MRI.  Dr. Randal Buba and I feel this can safely be done outpatient.  Patient's primary care was notified of the findings today and the need for outpatient follow-up.  Patient was also notified that he needs to follow with his primary care for outpatient imaging.  Patient states understanding and is in agreement with the plan.  The patient was discussed with Dr. Randal Buba who agrees with the treatment plan.  Final Clinical Impression(s) / ED Diagnoses Final diagnoses:  Epigastric pain  Liver mass    Rx / DC Orders ED Discharge Orders         Ordered    pantoprazole (PROTONIX) 40 MG tablet  Daily     07/04/19 0630    sucralfate (CARAFATE) 1 g tablet  3 times daily with meals & bedtime     07/04/19 0630           Jance Siek, Gwenlyn Perking 07/04/19 [place], April, MD 07/04/19 [phone]

## 2019-07-04 NOTE — ED Notes (Signed)
Patient transported to Ultrasound 

## 2019-07-04 NOTE — Discharge Instructions (Addendum)
1. Medications: protonix, carafate, usual home medications 2. Treatment: rest, drink plenty of fluids, advance diet slowly 3. Follow Up: Please followup with your primary doctor and gastroenterologist in 1 week for discussion of your diagnoses and further evaluation; Please return to the ER for persistent vomiting, high fevers or worsening symptoms

## 2019-07-17 ENCOUNTER — Telehealth: Payer: Self-pay

## 2019-07-17 NOTE — Telephone Encounter (Signed)
Doran Stabler, MD  Elias Else, CMA  I'll bet he will be.  Ree Edman - I left a paper on my desk with his info   Thanks very much   - HD       Previous Messages   ----- Message -----  From: Elias Else, CMA  Sent: 07/17/2019  8:12 AM EST  To: Doran Stabler, MD   I checked the APP schedules for today. [facility] are both full.  [facility] in [facility] does have openings if the pt is willing to drive there.     I reached [name], he's coming tomorrow to see [name] here at the [facility] location.

## 2019-07-18 ENCOUNTER — Ambulatory Visit (INDEPENDENT_AMBULATORY_CARE_PROVIDER_SITE_OTHER): Payer: BC Managed Care – PPO | Admitting: Nurse Practitioner

## 2019-07-18 ENCOUNTER — Encounter: Payer: Self-pay | Admitting: Nurse Practitioner

## 2019-07-18 ENCOUNTER — Encounter: Payer: Self-pay | Admitting: Family Medicine

## 2019-07-18 ENCOUNTER — Other Ambulatory Visit: Payer: Self-pay

## 2019-07-18 ENCOUNTER — Other Ambulatory Visit (INDEPENDENT_AMBULATORY_CARE_PROVIDER_SITE_OTHER): Payer: BC Managed Care – PPO

## 2019-07-18 VITALS — BP 118/76 | HR 88 | Temp 98.1°F | Ht 66.0 in | Wt 137.2 lb

## 2019-07-18 DIAGNOSIS — Z01818 Encounter for other preprocedural examination: Secondary | ICD-10-CM

## 2019-07-18 DIAGNOSIS — B181 Chronic viral hepatitis B without delta-agent: Secondary | ICD-10-CM

## 2019-07-18 DIAGNOSIS — R1013 Epigastric pain: Secondary | ICD-10-CM

## 2019-07-18 LAB — HEPATIC FUNCTION PANEL
ALT: 20 U/L (ref 0–53)
AST: 29 U/L (ref 0–37)
Albumin: 4 g/dL (ref 3.5–5.2)
Alkaline Phosphatase: 80 U/L (ref 39–117)
Bilirubin, Direct: 0.1 mg/dL (ref 0.0–0.3)
Total Bilirubin: 0.7 mg/dL (ref 0.2–1.2)
Total Protein: 8.6 g/dL — ABNORMAL HIGH (ref 6.0–8.3)

## 2019-07-18 LAB — LIPASE: Lipase: 28 U/L (ref 11.0–59.0)

## 2019-07-18 LAB — PROTIME-INR
INR: 1.3 ratio — ABNORMAL HIGH (ref 0.8–1.0)
Prothrombin Time: 14.3 s — ABNORMAL HIGH (ref 9.6–13.1)

## 2019-07-18 MED ORDER — DICYCLOMINE HCL 10 MG PO CAPS: ORAL_CAPSULE | ORAL | 0 refills | Status: DC

## 2019-07-18 NOTE — Telephone Encounter (Signed)
LVM for pt to call the office to schedule

## 2019-07-18 NOTE — Patient Instructions (Addendum)
If you are age 40 or older, your body mass index should be between 23-30. Your Body mass index is 22.15 kg/m. If this is out of the aforementioned range listed, please consider follow up with your Primary Care Provider.  If you are age 42 or younger, your body mass index should be between 19-25. Your Body mass index is 22.15 kg/m. If this is out of the aformentioned range listed, please consider follow up with your Primary Care Provider.   Continue with entecavir 0.5mg  daily Dicyclomine 10 mg 1 by mouth as needed for abdominal pain take I tablet 30 minutes before breakfast and dinner for the next 3-4 days then as needed.  Use Gaviscon 1 tablespoon three times a day.  You have been scheduled for an endoscopy. Please follow written instructions given to you at your visit today. If you use inhalers (even only as needed), please bring them with you on the day of your procedure.  You are scheduled for an MRI with contrast at [facility] on 07/25/2019 at 8:oo pm please arrive at 7:30pm with nothing to eat or drink 4 hours prior

## 2019-07-18 NOTE — Progress Notes (Signed)
07/18/2019 Maurice Lowe [id number] [birth year]   Chief Complaint:  Epigastric pain and weight loss   History of Present Illness: Maurice Lowe is a 39 year old male with a past medical history significant for Non Hodgkin's lymphoma and chronic hepatitis B diagnosed in 2017 on Entecavir 0.77m po Q day initially prescribed in 2018 with prior noncompliance. Hep Be antigen positive. He reports being compliant with taking Entecavir daily for the past year. He developed a persistent headache and epigastric pain that progressively worsened so he went to the urgent care clinic on 06/19/2019. He reported taking Ibuprofen 6017mone tab on on empty stomach 2 days prior  to the onset of his epigastric pain. He was prescribed Omeprazole 2037mo bid x 7 days then reduce the Omeprazole to once daily. His epigastric pain worsened after taking the Omeprazole. [facility]. Laboratory studies showed AST 45. ALT 29. Alk phos 75. T. Bili 1.2. Albumin 4.1. Lipase 39.  BUN 6. Cr. 0.91. WBC 8.0. Hg 14.3. HCT 42.8. PLT 196. UA was negative. An abdominal sonogram 07/04/2019 showed a cirrhotic appearing liver with an indistinct mass like area centered at segment 4B. ( A prior abdominal sonogram 03/31/2019 showed a 1.9 x 1.4 cm lesion in the left liver lobe. AFP was normal at that time).  He was discharged home on Pantoprazole 59m59mce daily and Carafate1 gm po tid. He presents today for further GI/hepatology follow up. He continues to have epigastric pain which is worse after eating any food. No dysphagia or heartburn. He stated he feels like he is going downhill since his ER evaluation. He developed a fever on and off for 3 days, reported his highest temp was 100.8. No further fever since 2/24. He awakens in he am with nausea. Earlier this morning he drank fruit punch the vomited red mucous emesis which he assessed was the red punch eat drank earlier and not blood. His nausea lasted  until 10am today. He continued to have dry heaves. He had recurrence of a headache last night and this morning. He is passing a normal brown formed stool once daily. No rectal bleeding.  He took Pepto bismal  1 capful 3 to 4 times daily for 4 days and his stools were black in color after taking the Pepto bismal. No further black stools since stopping Pepto bismal.  No rectal bleeding. He is compliant with taking Entecavir daily. He does not skip a dose. No alcohol or drug use. He has lost 20lbs since mid Jan. 2021.   Abdominal sonogram 07/04/2019: 1. Negative gallbladder. 2. Cirrhotic appearance of the liver with indistinct masslike area centered at segment 4 B, which has been seen on prior imaging as well.   Abdominal sonogram 03/31/2019:   There is diffuse parenchymal coarsening. There is a 1.9 x 1.4 cm hypoechoic lesion in the left lobe of the liver (previously measuring 2.6 x 2.0 cm the ultrasound of 02/08/2018). This lesion is not characterized on this ultrasound. Continued surveillance recommended. Portal vein is patent on color Doppler imaging with normal direction of blood flow towards the liver.   Current Outpatient Medications on File Prior to Visit  Medication Sig Dispense Refill  . entecavir (BARACLUDE) 0.5 MG tablet TAKE 1 TABLET BY MOUTH DAILY 30 tablet 0  . pantoprazole (PROTONIX) 40 MG tablet Take 1 tablet (40 mg total) by mouth daily. 30 tablet 0  . sucralfate (CARAFATE) 1 g tablet Take 1 tablet (  1 g total) by mouth 4 (four) times daily -  with meals and at bedtime. 30 tablet 0  . [DISCONTINUED] omeprazole (PRILOSEC) 20 MG capsule Take 2 x a day for 7 days then once a day until gone 30 capsule 0   No current facility-administered medications on file prior to visit.   Allergies  Allergen Reactions  . Phenergan [Promethazine] Other (See Comments)    Hyperactivity and agitation    Physical Exam: BP 118/76   Pulse 88   Temp 98.1 F (36.7 C)   Ht 5' 6"  (1.676 m)   Wt  137 lb 4 oz (62.3 kg)   BMI 22.15 kg/m  General: Well developed  39 year old male in no acute distress. Head: Normocephalic and atraumatic. Eyes:  No scleral icterus. Conjunctiva pink . Ears: Normal auditory acuity. Lungs: Clear throughout to auscultation. Heart: Regular rate and rhythm, no murmur. Abdomen: Soft, nondistended. Moderate epigastric tenderness without rebound or guarding. No masses or hepatomegaly. Normal bowel sounds x 4 quadrants.  Rectal: Deferred.  Musculoskeletal: Symmetrical with no gross deformities. Extremities: No edema. Neurological: Alert oriented x 4. No focal deficits.  Psychological:  Alert and cooperative. Normal mood and affect  Assessment and Recommendations:  54. 39 year old male with chronic hepatitis B on Entecavir 0.74m QD presents with epigastric pain. Normal LFTs. Abdominal sonogram 2/12/20121 showed a cirrhotic appearing liver with an indistinct mass like area to segment 4 B of the left lobe. Normal LFTs, PLT and Albumin level indicates cirrhosis less likely at this time.  -AFP, Hepatitis B DNA quant, hepatic panel, Lipase, PT/INR and SARS coronavirus -Abdominal MRI with contrast to rule out hepatoma/HCC and to assess the pancreas -EGD to rule out PUD, upper GI malignancy and esophageal/gastric varices. EGD  benefits and risks discussed including risk with sedation, risk of bleeding, perforation and infection  -Patient wishes to stop PPI tx and Carafate as his sx are worse on these medications -Patient declines need for antiemetic  -Patient to call our office if his abdominal pain worsens -Further follow up to be determined after the above evaluation completed -Advised 4 to 5 snack sized meals daily, Ensure -Eventual abdominal sono with elastography -Check Hep A total antibody level (will add to the above lab order),  if not immune he will require Hep A vaccination   2. Weight loss secondary to epigastric pain -See plan in # 1  3. History of Non  Hodgkin's Lymphoma

## 2019-07-21 ENCOUNTER — Telehealth: Payer: Self-pay

## 2019-07-21 LAB — TEST AUTHORIZATION

## 2019-07-21 LAB — HEPATITIS A ANTIBODY, TOTAL: Hepatitis A AB,Total: NONREACTIVE

## 2019-07-21 LAB — AFP TUMOR MARKER: AFP-Tumor Marker: 116.9 ng/mL — ABNORMAL HIGH (ref <6.1)

## 2019-07-21 LAB — HEPATITIS B DNA, ULTRAQUANTITATIVE, PCR
Hepatitis B DNA (Calc): 2.79 Log IU/mL — ABNORMAL HIGH
Hepatitis B DNA: 616 IU/mL — ABNORMAL HIGH

## 2019-07-21 NOTE — Progress Notes (Signed)
____________________________________________________________  Attending physician addendum:  Thank you for sending this case to me. I have reviewed the entire note, and the outlined plan seems appropriate.  I do not recall that he has experienced anything like this while I have been caring for him.  Definitely EGD soon.  MRI appropriate as well, both for hepatic finding and symptoms.   In the past he would drink alcohol excessively at times, but that would be reflected in the rise of his LFTs.  Does not appear to be the case at present.  Will await call from him if he changes his mind about the need for antiemetics.  Wilfrid Lund, MD  ____________________________________________________________

## 2019-07-21 NOTE — Telephone Encounter (Signed)
Lab code 508 Hepatitis A total antibody has been added to this patient's orders; [facility] confirmed results would be within 2-4 business days;

## 2019-07-21 NOTE — Telephone Encounter (Signed)
-----   Message from Noralyn Pick, NP sent at 07/19/2019  5:22 AM EST ----- Bre, can you  contact the lab and see if they can add Hepatitis A total antibody level to labs done 2/27? thx

## 2019-07-25 ENCOUNTER — Other Ambulatory Visit: Payer: Self-pay | Admitting: Gastroenterology

## 2019-07-25 ENCOUNTER — Emergency Department (HOSPITAL_COMMUNITY)
Admission: EM | Admit: 2019-07-25 | Discharge: 2019-07-25 | Disposition: A | Discharge status: Home / Self Care | Payer: BC Managed Care – PPO | Attending: Emergency Medicine | Admitting: Emergency Medicine

## 2019-07-25 ENCOUNTER — Ambulatory Visit (HOSPITAL_COMMUNITY)
Admission: RE | Admit: 2019-07-25 | Discharge: 2019-07-25 | Disposition: A | Discharge status: Home / Self Care | Payer: BC Managed Care – PPO | Source: Ambulatory Visit | Attending: Nurse Practitioner | Admitting: Nurse Practitioner

## 2019-07-25 ENCOUNTER — Encounter (HOSPITAL_COMMUNITY): Payer: Self-pay

## 2019-07-25 ENCOUNTER — Other Ambulatory Visit: Payer: Self-pay

## 2019-07-25 DIAGNOSIS — Z79899 Other long term (current) drug therapy: Secondary | ICD-10-CM | POA: Insufficient documentation

## 2019-07-25 DIAGNOSIS — R1010 Upper abdominal pain, unspecified: Secondary | ICD-10-CM | POA: Diagnosis not present

## 2019-07-25 DIAGNOSIS — Z87891 Personal history of nicotine dependence: Secondary | ICD-10-CM | POA: Insufficient documentation

## 2019-07-25 DIAGNOSIS — Z8572 Personal history of non-Hodgkin lymphomas: Secondary | ICD-10-CM | POA: Diagnosis not present

## 2019-07-25 DIAGNOSIS — B181 Chronic viral hepatitis B without delta-agent: Secondary | ICD-10-CM | POA: Insufficient documentation

## 2019-07-25 DIAGNOSIS — K7689 Other specified diseases of liver: Secondary | ICD-10-CM | POA: Diagnosis not present

## 2019-07-25 DIAGNOSIS — R1013 Epigastric pain: Secondary | ICD-10-CM | POA: Insufficient documentation

## 2019-07-25 LAB — COMPREHENSIVE METABOLIC PANEL
ALT: 34 U/L (ref 0–44)
AST: 46 U/L — ABNORMAL HIGH (ref 15–41)
Albumin: 4.1 g/dL (ref 3.5–5.0)
Alkaline Phosphatase: 83 U/L (ref 38–126)
Anion gap: 10 (ref 5–15)
BUN: <5 mg/dL — ABNORMAL LOW (ref 6–20)
CO2: 26 mmol/L (ref 22–32)
Calcium: 9.3 mg/dL (ref 8.9–10.3)
Chloride: 100 mmol/L (ref 98–111)
Creatinine, Ser: 0.84 mg/dL (ref 0.61–1.24)
GFR calc Af Amer: >60 mL/min (ref >60)
GFR calc non Af Amer: >60 mL/min (ref >60)
Glucose, Bld: 103 mg/dL — ABNORMAL HIGH (ref 70–99)
Potassium: 3.6 mmol/L (ref 3.5–5.1)
Sodium: 136 mmol/L (ref 135–145)
Total Bilirubin: 1.3 mg/dL — ABNORMAL HIGH (ref 0.3–1.2)
Total Protein: 9 g/dL — ABNORMAL HIGH (ref 6.5–8.1)

## 2019-07-25 LAB — CBC
HCT: 41.6 % (ref 39.0–52.0)
Hemoglobin: 13.6 g/dL (ref 13.0–17.0)
MCH: 27.9 pg (ref 26.0–34.0)
MCHC: 32.7 g/dL (ref 30.0–36.0)
MCV: 85.2 fL (ref 80.0–100.0)
Platelets: 267 10*3/uL (ref 150–400)
RBC: 4.88 MIL/uL (ref 4.22–5.81)
RDW: 12.2 % (ref 11.5–15.5)
WBC: 9.2 10*3/uL (ref 4.0–10.5)
nRBC: 0 % (ref 0.0–0.2)

## 2019-07-25 LAB — URINALYSIS, ROUTINE W REFLEX MICROSCOPIC
Bilirubin Urine: NEGATIVE
Glucose, UA: NEGATIVE mg/dL
Hgb urine dipstick: NEGATIVE
Ketones, ur: NEGATIVE mg/dL
Leukocytes,Ua: NEGATIVE
Nitrite: NEGATIVE
Protein, ur: NEGATIVE mg/dL
Specific Gravity, Urine: 1.009 (ref 1.005–1.030)
pH: 9 — ABNORMAL HIGH (ref 5.0–8.0)

## 2019-07-25 LAB — LACTIC ACID, PLASMA: Lactic Acid, Venous: 1.5 mmol/L (ref 0.5–1.9)

## 2019-07-25 LAB — LIPASE, BLOOD: Lipase: 23 U/L (ref 11–51)

## 2019-07-25 MED ORDER — SODIUM CHLORIDE 0.9% FLUSH
3.0000 mL | Freq: Once | INTRAVENOUS | Status: AC
  Administered 2019-07-25: 3 mL via INTRAVENOUS

## 2019-07-25 MED ORDER — SODIUM CHLORIDE 0.9 % IV BOLUS
1000.0000 mL | Freq: Once | INTRAVENOUS | Status: AC
  Administered 2019-07-25: 1000 mL via INTRAVENOUS

## 2019-07-25 MED ORDER — OXYCODONE HCL 5 MG PO CAPS: 5.0000 mg | ORAL_CAPSULE | Freq: Four times a day (QID) | ORAL | 0 refills | Status: DC | PRN

## 2019-07-25 MED ORDER — ONDANSETRON HCL 4 MG/2ML IJ SOLN
4.0000 mg | Freq: Once | INTRAMUSCULAR | Status: AC
  Administered 2019-07-25: 4 mg via INTRAVENOUS
  Filled 2019-07-25: qty 2

## 2019-07-25 MED ORDER — GADOBUTROL 1 MMOL/ML IV SOLN
6.0000 mL | Freq: Once | INTRAVENOUS | Status: AC | PRN
  Administered 2019-07-25: 6 mL via INTRAVENOUS

## 2019-07-25 MED ORDER — MORPHINE SULFATE (PF) 4 MG/ML IV SOLN
4.0000 mg | Freq: Once | INTRAVENOUS | Status: AC
  Administered 2019-07-25: 4 mg via INTRAVENOUS
  Filled 2019-07-25 (×2): qty 1

## 2019-07-25 NOTE — ED Provider Notes (Signed)
[facility] DEPT Provider Note   CSN: [id number] Arrival date & time: 07/25/19  1258     History Chief Complaint  Patient presents with  . Abdominal Pain  . Fever    Maurice Lowe is a 39 y.o. male.  He has a history of non-Hodgkin's lymphoma and hepatitis B.  He was seen about a month ago for abdominal pain.  He had an abnormal ultrasound at that time and has since followed up with GI.  He continues to have upper subxiphoid abdominal pain since then that waxes and wanes.  He said he is also had intermittent fevers for the last few weeks.  Supposed to get a outpatient MRI tonight but his pain was so bad that he presented to the emergency department.  He has been taking his medications without improvement.  Eating makes it worse.  Has nausea.  No diarrhea or constipation.  20 pound weight loss.  The history is provided by the patient.  Abdominal Pain Pain location: subxiphoid. Pain quality: aching   Pain radiates to:  Does not radiate Pain severity:  Severe Onset quality:  Gradual Duration:  4 weeks Timing:  Constant Progression:  Waxing and waning Chronicity:  New Context: not alcohol use, not recent travel and not trauma   Relieved by:  Nothing Worsened by:  Eating and palpation Ineffective treatments:  OTC medications Associated symptoms: nausea   Associated symptoms: no chest pain, no constipation, no dysuria, no fever, no hematemesis, no hematochezia, no hematuria, no shortness of breath, no sore throat and no vomiting        Past Medical History:  Diagnosis Date  . Elevated liver function tests   . Hepatic cyst   . Hepatitis   . Hepatitis B   . Non Hodgkin's lymphoma [facility])    age 63-16    Patient Active Problem List   Diagnosis Date Noted  . Personal history of non-Hodgkin lymphomas 08/04/2018  . RUQ abdominal pain 12/15/2016  . Chronic viral hepatitis B without delta agent and without coma ([facility]) 12/15/2016  . Liver lesion  12/15/2016  . Elevated LFTs   . Abdominal pain, epigastric     Past Surgical History:  Procedure Laterality Date  . PORT-A-CATH REMOVAL    . PORTACATH PLACEMENT         Family History  Problem Relation Age of Onset  . Heart disease Mother   . Stroke Paternal Grandmother   . Hepatitis Neg Hx   . Liver disease Neg Hx   . Colon cancer Neg Hx   . Esophageal cancer Neg Hx   . Stomach cancer Neg Hx   . Pancreatic cancer Neg Hx   . Colon polyps Neg Hx     Social History   Tobacco Use  . Smoking status: Former Research scientist (life sciences)  . Smokeless tobacco: Never Used  Substance Use Topics  . Alcohol use: Not Currently    Comment: none since admitted to hospital 12/09/15  . Drug use: Not Currently    Types: Marijuana    Comment: when travels to [place]; now "done" per pt as of 12/28/15    Home Medications Prior to Admission medications   Medication Sig Start Date End Date Taking? Authorizing Provider  dicyclomine (BENTYL) 10 MG capsule Take 1 tablet 30 minutes before breakfast and dinner for 3-4 days then 1 tablet by mouth twice a day. 07/18/19   Noralyn Pick, NP  entecavir (BARACLUDE) 0.5 MG tablet TAKE 1 TABLET BY MOUTH DAILY  07/25/19   Doran Stabler, MD  pantoprazole (PROTONIX) 40 MG tablet Take 1 tablet (40 mg total) by mouth daily. 07/04/19   Muthersbaugh, Jarrett Soho, PA-C  sucralfate (CARAFATE) 1 g tablet Take 1 tablet (1 g total) by mouth 4 (four) times daily -  with meals and at bedtime. 07/04/19   Muthersbaugh, Jarrett Soho, PA-C  omeprazole (PRILOSEC) 20 MG capsule Take 2 x a day for 7 days then once a day until gone 06/19/19 07/04/19  Raylene Everts, MD    Allergies    Phenergan [promethazine]  Review of Systems   Review of Systems  Constitutional: Positive for unexpected weight change. Negative for fever.  HENT: Negative for sore throat.   Eyes: Negative for visual disturbance.  Respiratory: Negative for shortness of breath.   Cardiovascular: Negative for chest pain.   Gastrointestinal: Positive for abdominal pain and nausea. Negative for constipation, hematemesis, hematochezia and vomiting.  Genitourinary: Negative for dysuria and hematuria.  Musculoskeletal: Negative for neck pain.  Skin: Negative for rash.  Neurological: Negative for headaches.    Physical Exam Updated Vital Signs BP (!) 145/89 (BP Location: Left Arm)   Pulse (!) 120   Temp 100.2 F (37.9 C) (Oral)   Resp 16   Ht 5\' 6"  (1.676 m)   Wt 59.9 kg   SpO2 99%   BMI 21.31 kg/m   Physical Exam Vitals and nursing note reviewed.  Constitutional:      Appearance: He is well-developed.  HENT:     Head: Normocephalic and atraumatic.  Eyes:     Conjunctiva/sclera: Conjunctivae normal.  Cardiovascular:     Rate and Rhythm: Regular rhythm. Tachycardia present.     Heart sounds: No murmur.  Pulmonary:     Effort: Pulmonary effort is normal. No respiratory distress.     Breath sounds: Normal breath sounds.  Abdominal:     General: Abdomen is flat.     Palpations: Abdomen is soft.     Tenderness: There is abdominal tenderness in the epigastric area. There is no guarding or rebound.  Musculoskeletal:        General: No deformity or signs of injury. Normal range of motion.     Cervical back: Neck supple.  Skin:    General: Skin is warm and dry.     Capillary Refill: Capillary refill takes less than 2 seconds.  Neurological:     General: No focal deficit present.     Mental Status: He is alert.     ED Results / Procedures / Treatments   Labs (all labs ordered are listed, but only abnormal results are displayed) Labs Reviewed  COMPREHENSIVE METABOLIC PANEL - Abnormal; Notable for the following components:      Result Value   Glucose, Bld 103 (*)    BUN <5 (*)    Total Protein 9.0 (*)    AST 46 (*)    Total Bilirubin 1.3 (*)    All other components within normal limits  URINALYSIS, ROUTINE W REFLEX MICROSCOPIC - Abnormal; Notable for the following components:   pH 9.0 (*)     All other components within normal limits  CULTURE, BLOOD (ROUTINE X 2)  CULTURE, BLOOD (ROUTINE X 2)  LIPASE, BLOOD  CBC  LACTIC ACID, PLASMA  LACTIC ACID, PLASMA    EKG None  Radiology No results found.  Procedures Procedures (including critical care time)  Medications Ordered in ED Medications  sodium chloride flush (NS) 0.9 % injection 3 mL (3 mLs Intravenous  Given 07/25/19 1349)  morphine 4 MG/ML injection 4 mg (4 mg Intravenous Given 07/25/19 1350)  sodium chloride 0.9 % bolus 1,000 mL (0 mLs Intravenous Stopped 07/25/19 1543)  ondansetron (ZOFRAN) injection 4 mg (4 mg Intravenous Given 07/25/19 1350)    ED Course  I have reviewed the triage vital signs and the nursing notes.  Pertinent labs & imaging results that were available during my care of the patient were reviewed by me and considered in my medical decision making (see chart for details).  Clinical Course as of Jul 24 1756  Fri Jul 25, 2019  1454 Patient here with abdominal pain ongoing for over a month along with intermittent fevers.  Low-grade temp here.  Initially tachycardic.  States he has not been able to eat or drink because it causes the pain to become worse.  Awaiting endoscopy by [facility].  Differential includes peptic ulcer disease, gastritis/also has a liver mass that is being worked up with an MRI ordered for tonight outpatient.    [MB]  [id number] Received some fluids along with pain and nausea medication.  Labs fairly unremarkable, LFTs bad around his baseline.   [MB]  1518 Reviewed the results with the patient.  He said his pain is improved.  I think the most helpful thing for him would be for him to keep his appointment for his MRI tonight and his ultrasound but scheduled.  We will provide him a prescription for some oxycodone to help with pain.   [MB]    Clinical Course User Index [MB] Hayden Rasmussen, MD   MDM Rules/Calculators/A&P                      Final Clinical Impression(s) / ED Diagnoses  Final diagnoses:  Upper abdominal pain    Rx / DC Orders ED Discharge Orders         Ordered    oxycodone (OXY-IR) 5 MG capsule  Every 6 hours PRN     07/25/19 1520           Hayden Rasmussen, MD 07/25/19 1758

## 2019-07-25 NOTE — ED Notes (Signed)
Patient BP 98/67 and patient feeling weak. MD aware.

## 2019-07-25 NOTE — Discharge Instructions (Addendum)
You were seen in the emergency department for evaluation of upper abdominal pain and intermittent fevers.  You had blood work that did not show any serious abnormalities.  You were given some IV fluids with improvement.  Please continue your work-up with [facility] with the MRI and ultrasound that are scheduled.  We are providing you with a short prescription of pain medicine to use as needed.  Return to the emergency department if any acute worsening or concerning symptoms.

## 2019-07-25 NOTE — ED Triage Notes (Signed)
Patient states he has upper abdominal pain x 1 month. Patient states the pain has increased and he called his PCP and was told to come to the ED. Patient states he is scheduled for an MRI tonight at [facility], Patient also c/o fever, emesis, bloated,and headache. Patient also reports that he is scheduled for an endoscopy next week.

## 2019-07-28 ENCOUNTER — Telehealth: Payer: Self-pay | Admitting: Nurse Practitioner

## 2019-07-28 ENCOUNTER — Telehealth: Payer: Self-pay | Admitting: Gastroenterology

## 2019-07-28 ENCOUNTER — Other Ambulatory Visit: Payer: Self-pay

## 2019-07-28 ENCOUNTER — Encounter: Payer: Self-pay | Admitting: Gastroenterology

## 2019-07-28 DIAGNOSIS — C220 Liver cell carcinoma: Secondary | ICD-10-CM

## 2019-07-28 MED ORDER — OXYCODONE HCL 5 MG PO TABS: 5.0000 mg | ORAL_TABLET | Freq: Four times a day (QID) | ORAL | 0 refills | Status: DC | PRN

## 2019-07-28 NOTE — Telephone Encounter (Signed)
Brittani,  I spoke to [place] by phone about his symptoms and MRI report.  His pain is fairly well-controlled on oxycodone 5 mg that he received at the ED 3 days ago.  He is still having temps up to 101, which I believe is related to the tumor. I recommended he could take 2 or 3 ibuprofen tablets as needed, as long as his stomach tolerates it.  He could alternate that with a tablet of extra strength Tylenol twice a day if needed.  We had a long discussion about his symptoms and his scan results.  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  I do not know what treatments will be favored for him, but he needs to be seen by oncology urgently.  He has significant abdominal pain, nausea and weight loss.  Also, please cancel his upper endoscopy scheduled for 07/30/2019, as we now have a cause for his symptoms.  I told him he did not need to go for preprocedure Covid testing today either.  Please send an urgent referral to oncology for hepatocellular carcinoma, and I will send a message to our hepatobiliary surgeon so they might review the case.  I expect it will be presented at multidisciplinary conference in the near future.  Also, I have sent a new prescription for oxycodone 5mg  #30 tabs, to get him through until his oncology evaluation.

## 2019-07-28 NOTE — Telephone Encounter (Signed)
Procedure and COVID screen cancelled .  Urgent referral placed.  I will forward this to the new GI oncology navigator.

## 2019-07-28 NOTE — Telephone Encounter (Signed)
Received a new pt referral from Dr. Loletha Carrow for liver cancer. Pt has been cld and scheduled to see Lacie on 3/10 at 8:15am. Pt has been made aware to arrive 15 minutes early.

## 2019-07-29 ENCOUNTER — Ambulatory Visit: Payer: BC Managed Care – PPO | Admitting: Gastroenterology

## 2019-07-29 NOTE — Progress Notes (Addendum)
[place]  [phone] Fax:([phone]) [place] Note   Patient Care Team: [name], [name], MD as PCP - General (Family Medicine) Jonnie Finner, RN as Oncology Nurse Navigator 07/30/2019  CHIEF COMPLAINTS/PURPOSE OF CONSULTATION:  Liver mass, referred by Dr. Loletha Carrow of Gastroenterology   HISTORY OF PRESENTING ILLNESS:  Maurice Lowe 39 y.o. male with history of non Hodgkin's lymphoma in 1994 s/p chemotherapy and chronic Hep B on Entecavir is here because of large liver mass concerning for malignancy. He has been followed by GI in the past, screening AFP has been normal since 01/04/2017 when it began. Serial abdominal ultrasounds have reflected heterogeneous liver. On 02/08/2018 an indeterminate lesion in the left hepatic lobe measuring 2.6 x2.0 x2.6 and felt to be stable from previous studies. On 03/31/2019 the liver appeared coarsened likely early stages of cirrhosis, the left hepatic lobe lesion had decreased to 1.9 x1.4. [place] on 07/04/19 showed more distinct cirrhotic changes and indistinct mass like area in seg 4B. In early 05/2019 he developed abdominal pain treated as GERD initially but became persistent and progressive. He frequented the ED for pain control. Overall he has 30 lbs weight loss since January, with nausea and vomiting in the morning. He was being scheduled for endoscopic work up but he had progressive epigastric pain and intermittent fever and presented to ED on 07/25/19. MRI showed a poorly defined infiltrative mass in the left lobe of the liver measuring up to approx 9.8 x5.8 x10.7 cm involving portions of segments 1, 2, 3, 4A and 4B and demonstrates variable degrees of internal enhancement on post gadolinium imaging. There are other small 2-3 mm nonenhancing lesions elsewhere throughout the right liver as well, overall concerning for [place].   Socially, he is married, lives at home with wife and 3 sons ages 87, 45, and 50. 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 He has insurance through his work. He was previously active. Never had colonoscopy or EGD. He drank alcohol moderately until he began treatment for Hep B, no alcohol in over 1 year. Denies former or current tobacco use. He smoked marijuana from his NHL diagnosis age 25 until age 20. He denies known family history of cancer.   Today, he presents to clinic alone. He has constant epigastric pain that fluctuates from 5-7 out of 10. Takes oxycodone 2-3 tabs daily that helps bring pain down to 3 or 4. Never completely resolves. He has low appetite with daily n/v in the morning. He is constipated from oxycodone. He is functional and continues to work from home, otherwise no stamina for much else. He reports intermittent fever since pain onset in January, Tmax 102.1, recently ranges 99-101. No fever today. Denies chills, cough, chest pain, dyspnea, leg swelling. He has occasional back pain when his epigastric pain is high and he has not slept well, otherwise no significant bone pain.     MEDICAL HISTORY:  Past Medical History:  Diagnosis Date  . Elevated liver function tests   . Hepatic cyst   . Hepatitis   . Hepatitis B   . Non Hodgkin's lymphoma [facility])    age [age]    SURGICAL HISTORY: Past Surgical History:  Procedure Laterality Date  . PORT-A-CATH REMOVAL    . PORTACATH PLACEMENT      SOCIAL HISTORY: Social History   Socioeconomic History  . Marital status: Married    Spouse name: Not on file  . Number of  children: 3  . Years of education: Not on file  . Highest education level: Not on file  Occupational History  . Occupation: help desk IT  Tobacco Use  . Smoking status: Former Research scientist (life sciences)  . Smokeless tobacco: Never Used  Substance and Sexual Activity  . Alcohol use: Not Currently    Comment: none since admitted to hospital 12/09/15  . Drug use: Not Currently    Types: Marijuana    Comment: when travels to  [place]; now "done" per pt as of 12/28/15  . Sexual activity: Not on file  Other Topics Concern  . Not on file  Social History Narrative  . Not on file   Social Determinants of Health   Financial Resource Strain:   . Difficulty of Paying Living Expenses: Not on file  Food Insecurity:   . Worried About Charity fundraiser in the Last Year: Not on file  . Ran Out of Food in the Last Year: Not on file  Transportation Needs:   . Lack of Transportation (Medical): Not on file  . Lack of Transportation (Non-Medical): Not on file  Physical Activity:   . Days of Exercise per Week: Not on file  . Minutes of Exercise per Session: Not on file  Stress:   . Feeling of Stress : Not on file  Social Connections:   . Frequency of Communication with Friends and Family: Not on file  . Frequency of Social Gatherings with Friends and Family: Not on file  . Attends Religious Services: Not on file  . Active Member of Clubs or Organizations: Not on file  . Attends Archivist Meetings: Not on file  . Marital Status: Not on file  Intimate Partner Violence:   . Fear of Current or Ex-Partner: Not on file  . Emotionally Abused: Not on file  . Physically Abused: Not on file  . Sexually Abused: Not on file    FAMILY HISTORY: Family History  Problem Relation Age of Onset  . Heart disease Mother   . Stroke Paternal Grandmother   . Hepatitis Neg Hx   . Liver disease Neg Hx   . Colon cancer Neg Hx   . Esophageal cancer Neg Hx   . Stomach cancer Neg Hx   . Pancreatic cancer Neg Hx   . Colon polyps Neg Hx     ALLERGIES:  is allergic to phenergan [promethazine].  MEDICATIONS:  Current Outpatient Medications  Medication Sig Dispense Refill  . entecavir (BARACLUDE) 0.5 MG tablet TAKE 1 TABLET BY MOUTH DAILY 30 tablet 2  . ondansetron (ZOFRAN) 8 MG tablet Take 1 tablet (8 mg total) by mouth every 8 (eight) hours as needed for nausea or vomiting. 45 tablet 1  . oxyCODONE (OXY IR/ROXICODONE) 5 MG  immediate release tablet Take 1 tablet (5 mg total) by mouth every 6 (six) hours as needed for severe pain. 30 tablet 0   No current facility-administered medications for this visit.    REVIEW OF SYSTEMS:   Constitutional: Denies chills or abnormal night sweats (+) fever (+) fatigue  Eyes: Denies blurriness of vision, double vision or watery eyes Ears, nose, mouth, throat, and face: Denies mucositis or sore throat Respiratory: Denies cough, dyspnea or wheezes Cardiovascular: Denies palpitation, chest discomfort or lower extremity swelling Gastrointestinal:  Denies  heartburn (+) n/v (+) constipation (+) epigastric pain  Skin: Denies abnormal skin rashes Lymphatics: Denies new lymphadenopathy or easy bruising Neurological:Denies numbness, tingling or new weaknesses Behavioral/Psych: Mood is stable, no new changes  All other systems were reviewed with the patient and are negative.  PHYSICAL EXAMINATION: ECOG PERFORMANCE STATUS: 1 - Symptomatic but completely ambulatory  Vitals:   07/30/19 0804  BP: 119/83  Pulse: 71  Resp: 16  Temp: 99.1 F (37.3 C)  SpO2: 100%   Filed Weights   07/30/19 0804  Weight: 134 lb 4.8 oz (60.9 kg)    GENERAL:alert, no distress and comfortable SKIN: no rash  EYES:  sclera clear NECK: without mass LYMPH:  no palpable cervical or supraclavicular lymphadenopathy  LUNGS: clear with normal breathing effort HEART: regular rate & rhythm, no lower extremity edema ABDOMEN: abdomen soft, flat, normal bowel sounds. Marked TTP in epigastric area Musculoskeletal: no focal tenderness  PSYCH: alert & oriented x 3 with fluent speech NEURO: no focal motor/sensory deficits  LABORATORY DATA:  I have reviewed the data as listed CBC Latest Ref Rng & Units 07/25/2019 07/03/2019 03/26/2019  WBC 4.0 - 10.5 K/uL 9.2 8.0 4.6  Hemoglobin 13.0 - 17.0 g/dL 13.6 14.3 14.7  Hematocrit 39.0 - 52.0 % 41.6 42.8 44.5  Platelets 150 - 400 K/uL 267 196 166.0   CMP Latest Ref  Rng & Units 07/25/2019 07/18/2019 07/03/2019  Glucose 70 - 99 mg/dL 103(H) - 96  BUN 6 - 20 mg/dL <5(L) - 6  Creatinine 0.61 - 1.24 mg/dL 0.84 - 0.91  Sodium 135 - 145 mmol/L 136 - 136  Potassium 3.5 - 5.1 mmol/L 3.6 - 3.9  Chloride 98 - 111 mmol/L 100 - 101  CO2 22 - 32 mmol/L 26 - 24  Calcium 8.9 - 10.3 mg/dL 9.3 - 9.4  Total Protein 6.5 - 8.1 g/dL 9.0(H) 8.6(H) 7.9  Total Bilirubin 0.3 - 1.2 mg/dL 1.3(H) 0.7 1.2  Alkaline Phos 38 - 126 U/L 83 80 75  AST 15 - 41 U/L 46(H) 29 45(H)  ALT 0 - 44 U/L 34 20 29     RADIOGRAPHIC STUDIES: I have personally reviewed the radiological images as listed and agreed with the findings in the report. MR Abdomen W Wo Contrast  Result Date: 07/26/2019 CLINICAL DATA:  39 year old male with history of chronic hepatitis B infection. Epigastric pain. EXAM: MRI ABDOMEN WITHOUT AND WITH CONTRAST TECHNIQUE: Multiplanar multisequence MR imaging of the abdomen was performed both before and after the administration of intravenous contrast. CONTRAST:  1mL GADAVIST GADOBUTROL 1 MMOL/ML IV SOLN COMPARISON:  No priors. FINDINGS: Lower chest: Unremarkable. Hepatobiliary: Liver has a nodular contour, indicative of cirrhosis. Throughout the left lobe of the liver there is a poorly defined infiltrative mass which is best appreciated on fat saturated T2 weighted images where this region measures up to approximately 9.8 x 5.8 x 10.7 cm (axial image 17 of series 4 and coronal image 6 of series 3) involving portions of segments 1, 2, 3, 4A and 4B. This area is T1 hypointense, T2 hyperintense and demonstrates variable degrees of internal enhancement on post gadolinium imaging. Much of this region demonstrates diffusion restriction. Innumerable other small 2-3 mm T1 hypointense, T2 isointense nonenhancing lesions are noted elsewhere throughout the right lobe of the liver as well, presumably small regenerative micronodules. No intra or extrahepatic biliary ductal dilatation. Gallbladder is  normal in appearance. Pancreas: No pancreatic mass. No pancreatic ductal dilatation. No pancreatic or peripancreatic fluid collections or inflammatory changes. Spleen:  Unremarkable. Adrenals/Urinary Tract: Bilateral kidneys and adrenal glands are normal in appearance. No hydroureteronephrosis. Stomach/Bowel: Visualized portions are unremarkable. Vascular/Lymphatic: No aneurysm identified in the visualized abdominal vasculature. No lymphadenopathy noted in the  abdomen. Other: No significant volume of ascites noted in the visualized portions of the peritoneal cavity. Musculoskeletal: No aggressive appearing osseous lesions are noted in the visualized portions of the skeleton. IMPRESSION: 1. Large diffusely infiltrative mass centered in the left lobe of the liver, with indeterminate but very aggressive imaging characteristics, highly concerning for infiltrative tumor considered likely hepatocellular carcinoma. These results will be called to the ordering clinician or representative by the Radiologist Assistant, and communication documented in the PACS or zVision Dashboard. Electronically Signed   By: Vinnie Langton M.D.   On: 07/26/2019 05:51   US Abdomen Limited  Result Date: 07/04/2019 CLINICAL DATA:  Right upper quadrant pain EXAM: ULTRASOUND ABDOMEN LIMITED RIGHT UPPER QUADRANT COMPARISON:  03/31/2019 FINDINGS: Gallbladder: No gallstones or wall thickening visualized. No sonographic Murphy sign noted by sonographer. Common bile duct: Diameter: 2 mm Liver: Heterogeneous echotexture diffusely with lobulated appearance of the liver surface. Even greater heterogeneity at the level of the segment 4 B, where there was probable perfusion anomaly and clustered low-density lesions on 2018 CT. Measurements were provided on the worksheet for this area but discrete and accurate measurement is not confidently acquired today. Patent and antegrade flow in the main portal vein. IMPRESSION: 1. Negative gallbladder. 2.  Cirrhotic appearance of the liver with indistinct masslike area centered at segment 4 B, which has been seen on prior imaging as well. Recommend GI follow-up and liver MRI. Electronically Signed   By: Monte Fantasia M.D.   On: 07/04/2019 05:20    ASSESSMENT & PLAN: 39 yo male with   1. Liver mass, hepatocellular carcinoma  -We reviewed his medical record in great detail. Pt is a 39yo male with chronic hepatitis B on entecavir, liver cirrhosis without evidence of portal hypertension or ascites, followed by GI Dr. Loletha Carrow.  -His Child-Pugh score is 5 (class A).  -He has a 9.8 x5.8 x10.7 cm poorly defined, infiltrative liver mass with findings consistent with HCC, and elevated AFP at 116 (normal 4 months ago), so his diagnosis of HCC is quite certain and appears to have developed rapidly. Dr. Burr Medico does not feel he needs a biopsy to confirm the diagnosis. However, we are recommending he proceed with EGD/colonoscopy to r/o GI primary.  -He is being referred for CT chest and a bone scan to complete staging.  -We discuss the treatment option of liver transplant versus surgical resection. His tumor does not meet criteria for transplant due to the large size. He is being referred to hepatobiliary surgeon Dr. Barry Dienes to discuss resectability if no other distant metastasis. He understands the tumor is large and surgery would be difficult and he may not be a candidate.  -if not a surgical candidate, treatment would be palliative, to control his disease and prolong his life, improve pain, etc. He knows without surgery, his cancer is not curable.  -we discussed various liver-directed therapies such as Y-90 and radiation. Will discuss with IR and rad onc -His case will be reviewed in our GI tumor board  -We also discussed treatment options including oral TKI vs systemic therapy IV immunotherapy plus biological therapy, atezolizumab with bevacizumab. Due to the aggressive nature of his disease, systemic therapy is  favored.  -We are recommending for him to return to Dr. Loletha Carrow for EGD to r/o varices, as avastin can cause bleeding.  -He is young, with adequate liver function and otherwise good baseline health, he would likely tolerate systemic treatment. We would monitor with AFP testing and interim staging scan after  3 months of therapy.  -We will call him after GI tumor board discussion, staging, and consult with Dr. Barry Dienes.   2. Epigastric pain, secondary to #1 -Managed with oxycodone 5 mg PRN, he takes 2-3 tabs per day. -Can increase to 1-2 tabs PRN when pain is severe.   3. N/V/C, weight loss  -Secondary to #1, constipation related to opioids -We discussed bowel regimen and nutrition  -Continue oxycodone PRN, takes 2-3 tabs per day which is effective -I prescribed Zofran PRN, and encouraged him to use before meals to promote weight gain and improved nutrition   4. 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 Loletha Carrow, on Entecavir, he does not know how he contracted Hep B or when exactly -No evidence of ascites on imaging  -he abstains from alcohol, no use in >1 year -We are recommending EGD, to r/o varices, and colonoscopy also to r/o GI primary  -Tbili 1.3  5. 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 If he takes FMLA, he will lose his job and insurance.  -His wife is starting new job soon, but her insurance may not be that good. -Has 3 sons at home ages 41, 22, 16 -If he requires surgery, he will likely be out of work. However, if she is on systemic q3 weeks treatment, he will likely still be able to work.  -Will follow closely and refer to SW if needed  6. History of T-cell non-hodgkins lymphoma, 1994 -s/p chemotherapy (including methotrexate) at [facility]   PLAN: -Medical record reviewed -Staging work up with CT chest and bone scan in the next week  -GI tumor board discussion 3/17 -Call patient next week after tumor board  -Urgent referral to Dr. Barry Dienes   -EGD/colonoscopy per Dr. Loletha Carrow as to r/o GI primary and varices, will send him a message -Rx: Zofran   Orders Placed This Encounter  Procedures  . CT Chest Wo Contrast    Standing Status:   Future    Standing Expiration Date:   07/29/2020    Order Specific Question:   Preferred imaging location?    Answer:   [facility]    Order Specific Question:   Radiology Contrast Protocol - do NOT remove file path    Answer:   \\[email or web address].pdf  . NM Bone Scan Whole Body    Standing Status:   Future    Standing Expiration Date:   07/29/2020    Order Specific Question:   If indicated for the ordered procedure, I authorize the administration of a radiopharmaceutical per Radiology protocol    Answer:   Yes    Order Specific Question:   Preferred imaging location?    Answer:   [facility]    Order Specific Question:   Radiology Contrast Protocol - do NOT remove file path    Answer:   \\[email or web address].pdf  . CBC with Differential ([place] Only)    Standing Status:   Standing    Number of Occurrences:   50    Standing Expiration Date:   07/29/2020  . CMP ([place] only)    Standing Status:   Standing    Number of Occurrences:   50    Standing Expiration Date:   07/29/2020  . AFP tumor marker    Standing Status:   Standing    Number of Occurrences:   20    Standing Expiration Date:   07/29/2020  . Ambulatory referral to General Surgery  Referral Priority:   Urgent    Referral Type:   Surgical    Referral Reason:   Specialty Services Required    Referred to Provider:   Stark Klein, MD    Requested Specialty:   General Surgery    Number of Visits Requested:   1    All questions were answered. The patient knows to call the clinic with any problems, questions or concerns.     Alla Feeling, NP 07/30/2019   Addendum  I have seen the patient, examined him. I agree with the assessment and and plan and have edited  the notes.   39 year old male with past medical history of chronic hepatitis B infection, on Entecavir, early liver cirrhosis with Child-Pugh score 5 (class A), presented with abdominal pain, fatigue and mild weight loss. I reviewed his recent abdominal MRI images with pt, which showed a large infiltrative mass which involves entire left lobe, and numerous tiny nonenhancing lesion in the right lobe, presumably regenerative micronodules.  There is no extrahepatic metastasis on MRI.  AFP previous was normal, increased to 116 recently, this is highly suspicious for [facility]. I also reviewed his multiple previous abdominal ultrasound and a CT scan, the left lower mass was first noticed in July 2018, measuring 1.7 cm, increased to 2.6cm in 01/2018, but decreased to 1.9cm in 03/2019.   We will reviewed his case in our GI tumor board next week.   We will get a CT chest and bone scan to complete staging.  I discussed treatment options for HCC.  I am not sure if this is surgically resectable, giving the large size of the tumor, and numerous indeterminate nodules in the right lobe. I will send a message to surgeon Dr. Barry Dienes to get her opinion.   Unfortunately, he is not a candidate for liver transplant, or liver ablation.  I discussed the option of embolization, such as Y 90, systemic therapy.  Given the rapid progression in the past 4 months, I recommend systemic therapy over Y90.  I discussed the option of TKI, such as sorafenib, lenvatinib, immunotherapy single agent, or in combination with bevacizumab.  I discussed the results of the IMbrave trial that compared atezolizumab + bevacizumab to sorafenib with a marked improvement in OS (HR 0.56) and PFS (HR 0.59) for atezo/bev over sorafenib. So I recommend Atezolizumab and bevacizumab every 3 weeks as first-line therapy.  I discussed the potential side effects, especially autoimmune related disease, such as pneumonitis, colitis and autoimmune related endocrine disorder,  hypertension, risk of bleeding and thrombosis, bowel perforation, proteinuria, renal dysfunction, etc. patient voiced good understanding, agree with the recommendation if surgery is not an option.   I will send a urgent consult to Dr. Barry Dienes and personal communicate with her. If he is not a candidate for surgery, will proceed with Atezo and Beva late next week.   I will recommend pt to have EGD and colonoscopy in the near future.  Truitt Merle  07/30/2019

## 2019-07-30 ENCOUNTER — Other Ambulatory Visit: Payer: Self-pay | Admitting: Hematology

## 2019-07-30 ENCOUNTER — Telehealth: Payer: Self-pay | Admitting: Gastroenterology

## 2019-07-30 ENCOUNTER — Encounter: Payer: Self-pay | Admitting: Nurse Practitioner

## 2019-07-30 ENCOUNTER — Inpatient Hospital Stay: Payer: BC Managed Care – PPO | Attending: Nurse Practitioner | Admitting: Nurse Practitioner

## 2019-07-30 ENCOUNTER — Other Ambulatory Visit: Payer: Self-pay

## 2019-07-30 ENCOUNTER — Encounter: Payer: BC Managed Care – PPO | Admitting: Gastroenterology

## 2019-07-30 VITALS — BP 119/83 | HR 71 | Temp 99.1°F | Resp 16 | Ht 66.0 in | Wt 134.3 lb

## 2019-07-30 DIAGNOSIS — C220 Liver cell carcinoma: Secondary | ICD-10-CM | POA: Diagnosis not present

## 2019-07-30 DIAGNOSIS — K7469 Other cirrhosis of liver: Secondary | ICD-10-CM | POA: Insufficient documentation

## 2019-07-30 DIAGNOSIS — Z9221 Personal history of antineoplastic chemotherapy: Secondary | ICD-10-CM | POA: Diagnosis not present

## 2019-07-30 DIAGNOSIS — K7460 Unspecified cirrhosis of liver: Secondary | ICD-10-CM | POA: Insufficient documentation

## 2019-07-30 DIAGNOSIS — Z8572 Personal history of non-Hodgkin lymphomas: Secondary | ICD-10-CM | POA: Diagnosis not present

## 2019-07-30 DIAGNOSIS — C779 Secondary and unspecified malignant neoplasm of lymph node, unspecified: Secondary | ICD-10-CM | POA: Diagnosis not present

## 2019-07-30 DIAGNOSIS — G893 Neoplasm related pain (acute) (chronic): Secondary | ICD-10-CM | POA: Diagnosis not present

## 2019-07-30 DIAGNOSIS — B181 Chronic viral hepatitis B without delta-agent: Secondary | ICD-10-CM | POA: Insufficient documentation

## 2019-07-30 DIAGNOSIS — Z7189 Other specified counseling: Secondary | ICD-10-CM | POA: Insufficient documentation

## 2019-07-30 DIAGNOSIS — Z79899 Other long term (current) drug therapy: Secondary | ICD-10-CM | POA: Diagnosis not present

## 2019-07-30 LAB — CULTURE, BLOOD (ROUTINE X 2)
Culture: NO GROWTH
Culture: NO GROWTH
Special Requests: ADEQUATE
Special Requests: ADEQUATE

## 2019-07-30 MED ORDER — ONDANSETRON HCL 8 MG PO TABS: 8.0000 mg | ORAL_TABLET | Freq: Three times a day (TID) | ORAL | 1 refills | Status: DC | PRN

## 2019-07-30 NOTE — Progress Notes (Signed)
START OFF PATHWAY REGIMEN - Other   [id number]:Atezolizumab 1,200 mg IV D1 + Bevacizumab 15 mg/kg IV D1 q21 Days:   A cycle is every 21 Days:     Atezolizumab      Bevacizumab-xxxx   **Always confirm dose/schedule in your pharmacy ordering system**  Patient Characteristics: Intent of Therapy: Non-Curative / Palliative Intent, Discussed with Patient 

## 2019-07-30 NOTE — Telephone Encounter (Signed)
Maurice Lowe,  This is a patient of mine with a newly-diagnosed liver mass.  We believe it to be primary liver cancer (hepatocellular carcinoma), but his oncologist (Dr. Burr Medico) asked if I would perform an EGD and colonoscopy soon to be sure there is no additional cancer in either the upper or lower digestive tract. He will also be seeing Dr. Barry Dienes  to see if the mass is amenable to surgical resection.  Please contact [place] and make arrangements ASAP (next [place] slot for ECL appears to be in AM on 3/25.  Please hold that slot for him so it is not taken for clinic patients this week).   Let me know if any questions.  A lot has happened to him in last few weeks, and is likely feeling overwhelmed.

## 2019-07-30 NOTE — Patient Instructions (Signed)

## 2019-07-30 NOTE — Progress Notes (Signed)
Met with patient during initial medical oncology appointment with Cira Rue NP/Dr. Burr Medico.  He was given my direct phone number for contact for any questions or concerns that arise during his care.  I explained my role as nurse navigator.  He verbalized an understanding and was very Patent attorney.  An urgent referral has been sent to Dr. Barry Dienes for possible surgical intervention.

## 2019-07-31 ENCOUNTER — Other Ambulatory Visit: Payer: Self-pay

## 2019-07-31 ENCOUNTER — Encounter (HOSPITAL_COMMUNITY): Payer: Self-pay

## 2019-07-31 DIAGNOSIS — C787 Secondary malignant neoplasm of liver and intrahepatic bile duct: Secondary | ICD-10-CM

## 2019-07-31 DIAGNOSIS — C220 Liver cell carcinoma: Secondary | ICD-10-CM

## 2019-07-31 NOTE — Telephone Encounter (Signed)
Excellent, thank you!

## 2019-07-31 NOTE — Addendum Note (Signed)
Addended by: Truitt Merle on: 07/31/2019 09:15 AM   Modules accepted: Orders

## 2019-07-31 NOTE — Progress Notes (Unsigned)
Maurice Lowe Male, 39 y.o., 1981/04/02 MRN:  [id number] Phone:  [phone] [name]) PCP:  [name], [name], MD Coverage:  Sherre Poot Blue [employer] Ppo Next Appt With Radiology ([facility]) 08/08/2019 at 7:00 AM  RE: Biospy Received: Today Message Contents  [name], MD  [name]      US guided biopsy of dominant tumor in left lobe. Discussed w/ Dr. Burr Medico. Although AFP elevated, she wants to make sure this is not a cholangio CA.   GY   Previous Messages  ----- Message -----  From: [name]  Sent: 07/31/2019  1:52 PM EST  To: Ir Procedure Requests  Subject: Biospy                      Procedure Requested: US Biopsy (Liver)    Reason for Procedure: left lobe liver lesion biosy, approved by Dr. Kathlene Cote    Provider Requesting: Truitt Merle  Provider Telephone:  [phone]    Other Info: Rad Exams in Epic

## 2019-07-31 NOTE — Telephone Encounter (Signed)
Spoke with this very nice patient. He agrees to the following appointments Virtual Pre-visit 08/06/19 at 8:30 am (Pre-visit is aware) COVID screening 08/12/19 3:20 pm EGD/colon 08/14/19 arrive at 8:30am for a 9:30am start time.

## 2019-08-05 ENCOUNTER — Other Ambulatory Visit: Payer: Self-pay | Admitting: Nurse Practitioner

## 2019-08-05 ENCOUNTER — Encounter: Payer: Self-pay | Admitting: Nurse Practitioner

## 2019-08-05 ENCOUNTER — Telehealth: Payer: Self-pay | Admitting: Hematology

## 2019-08-05 MED ORDER — OXYCODONE HCL 5 MG PO TABS: 5.0000 mg | ORAL_TABLET | Freq: Four times a day (QID) | ORAL | 0 refills | Status: DC | PRN

## 2019-08-05 NOTE — Telephone Encounter (Signed)
Scheduled appt per 3/15 sch message - pt is aware of apt date and time

## 2019-08-06 ENCOUNTER — Other Ambulatory Visit: Payer: Self-pay

## 2019-08-06 ENCOUNTER — Ambulatory Visit (AMBULATORY_SURGERY_CENTER): Payer: BC Managed Care – PPO | Admitting: *Deleted

## 2019-08-06 VITALS — Ht 66.0 in | Wt 128.0 lb

## 2019-08-06 DIAGNOSIS — R160 Hepatomegaly, not elsewhere classified: Secondary | ICD-10-CM

## 2019-08-06 DIAGNOSIS — C220 Liver cell carcinoma: Secondary | ICD-10-CM

## 2019-08-06 DIAGNOSIS — Z01818 Encounter for other preprocedural examination: Secondary | ICD-10-CM

## 2019-08-06 NOTE — Progress Notes (Signed)
Sutab sample provided to patient. Mother is picking up today.

## 2019-08-06 NOTE — Progress Notes (Signed)

## 2019-08-07 ENCOUNTER — Other Ambulatory Visit: Payer: Self-pay | Admitting: Radiology

## 2019-08-08 ENCOUNTER — Ambulatory Visit (HOSPITAL_COMMUNITY)
Admission: RE | Admit: 2019-08-08 | Discharge: 2019-08-08 | Disposition: A | Discharge status: Home / Self Care | Payer: BC Managed Care – PPO | Source: Ambulatory Visit | Attending: Hematology | Admitting: Hematology

## 2019-08-08 ENCOUNTER — Other Ambulatory Visit: Payer: Self-pay

## 2019-08-08 ENCOUNTER — Encounter: Payer: Self-pay | Admitting: Gastroenterology

## 2019-08-08 DIAGNOSIS — C220 Liver cell carcinoma: Secondary | ICD-10-CM

## 2019-08-08 DIAGNOSIS — C787 Secondary malignant neoplasm of liver and intrahepatic bile duct: Secondary | ICD-10-CM | POA: Diagnosis not present

## 2019-08-08 LAB — GLUCOSE, CAPILLARY: Glucose-Capillary: 104 mg/dL — ABNORMAL HIGH (ref 70–99)

## 2019-08-08 MED ORDER — FLUDEOXYGLUCOSE F - 18 (FDG) INJECTION: 6.4000 | Freq: Once | INTRAVENOUS | Status: DC | PRN

## 2019-08-09 ENCOUNTER — Other Ambulatory Visit: Payer: Self-pay | Admitting: Nurse Practitioner

## 2019-08-11 ENCOUNTER — Other Ambulatory Visit: Payer: Self-pay

## 2019-08-11 ENCOUNTER — Ambulatory Visit (HOSPITAL_COMMUNITY)
Admission: RE | Admit: 2019-08-11 | Discharge: 2019-08-11 | Disposition: A | Discharge status: Home / Self Care | Payer: BC Managed Care – PPO | Source: Ambulatory Visit | Attending: Hematology | Admitting: Hematology

## 2019-08-11 ENCOUNTER — Encounter (HOSPITAL_COMMUNITY): Payer: Self-pay

## 2019-08-11 DIAGNOSIS — K769 Liver disease, unspecified: Secondary | ICD-10-CM | POA: Insufficient documentation

## 2019-08-11 DIAGNOSIS — Z8572 Personal history of non-Hodgkin lymphomas: Secondary | ICD-10-CM | POA: Insufficient documentation

## 2019-08-11 DIAGNOSIS — C229 Malignant neoplasm of liver, not specified as primary or secondary: Secondary | ICD-10-CM | POA: Diagnosis not present

## 2019-08-11 DIAGNOSIS — K7460 Unspecified cirrhosis of liver: Secondary | ICD-10-CM | POA: Diagnosis not present

## 2019-08-11 DIAGNOSIS — Z79899 Other long term (current) drug therapy: Secondary | ICD-10-CM | POA: Insufficient documentation

## 2019-08-11 DIAGNOSIS — C220 Liver cell carcinoma: Secondary | ICD-10-CM

## 2019-08-11 DIAGNOSIS — B181 Chronic viral hepatitis B without delta-agent: Secondary | ICD-10-CM | POA: Diagnosis not present

## 2019-08-11 DIAGNOSIS — Z87891 Personal history of nicotine dependence: Secondary | ICD-10-CM | POA: Insufficient documentation

## 2019-08-11 DIAGNOSIS — K7689 Other specified diseases of liver: Secondary | ICD-10-CM | POA: Diagnosis not present

## 2019-08-11 LAB — COMPREHENSIVE METABOLIC PANEL
ALT: 24 U/L (ref 0–44)
AST: 47 U/L — ABNORMAL HIGH (ref 15–41)
Albumin: 3.2 g/dL — ABNORMAL LOW (ref 3.5–5.0)
Alkaline Phosphatase: 102 U/L (ref 38–126)
Anion gap: 15 (ref 5–15)
BUN: 8 mg/dL (ref 6–20)
CO2: 24 mmol/L (ref 22–32)
Calcium: 10.6 mg/dL — ABNORMAL HIGH (ref 8.9–10.3)
Chloride: 96 mmol/L — ABNORMAL LOW (ref 98–111)
Creatinine, Ser: 0.81 mg/dL (ref 0.61–1.24)
GFR calc Af Amer: >60 mL/min (ref >60)
GFR calc non Af Amer: >60 mL/min (ref >60)
Glucose, Bld: 87 mg/dL (ref 70–99)
Potassium: 3.8 mmol/L (ref 3.5–5.1)
Sodium: 135 mmol/L (ref 135–145)
Total Bilirubin: 0.7 mg/dL (ref 0.3–1.2)
Total Protein: 9.1 g/dL — ABNORMAL HIGH (ref 6.5–8.1)

## 2019-08-11 LAB — CBC WITH DIFFERENTIAL/PLATELET
Abs Immature Granulocytes: 0.04 10*3/uL (ref 0.00–0.07)
Basophils Absolute: 0 10*3/uL (ref 0.0–0.1)
Basophils Relative: 0 %
Eosinophils Absolute: 0 10*3/uL (ref 0.0–0.5)
Eosinophils Relative: 0 %
HCT: 37.1 % — ABNORMAL LOW (ref 39.0–52.0)
Hemoglobin: 12 g/dL — ABNORMAL LOW (ref 13.0–17.0)
Immature Granulocytes: 0 %
Lymphocytes Relative: 19 %
Lymphs Abs: 2 10*3/uL (ref 0.7–4.0)
MCH: 27.8 pg (ref 26.0–34.0)
MCHC: 32.3 g/dL (ref 30.0–36.0)
MCV: 86.1 fL (ref 80.0–100.0)
Monocytes Absolute: 0.9 10*3/uL (ref 0.1–1.0)
Monocytes Relative: 8 %
Neutro Abs: 7.6 10*3/uL (ref 1.7–7.7)
Neutrophils Relative %: 73 %
Platelets: 325 10*3/uL (ref 150–400)
RBC: 4.31 MIL/uL (ref 4.22–5.81)
RDW: 12.1 % (ref 11.5–15.5)
WBC: 10.5 10*3/uL (ref 4.0–10.5)
nRBC: 0 % (ref 0.0–0.2)

## 2019-08-11 LAB — PROTIME-INR
INR: 1.1 (ref 0.8–1.2)
Prothrombin Time: 14 seconds (ref 11.4–15.2)

## 2019-08-11 MED ORDER — FENTANYL CITRATE (PF) 100 MCG/2ML IJ SOLN
INTRAMUSCULAR | Status: AC
  Filled 2019-08-11: qty 2

## 2019-08-11 MED ORDER — LIDOCAINE HCL (PF) 1 % IJ SOLN
INTRAMUSCULAR | Status: AC | PRN
  Administered 2019-08-11: 10 mL

## 2019-08-11 MED ORDER — MIDAZOLAM HCL 2 MG/2ML IJ SOLN
INTRAMUSCULAR | Status: AC | PRN
  Administered 2019-08-11 (×2): 1 mg via INTRAVENOUS

## 2019-08-11 MED ORDER — LIDOCAINE HCL 1 % IJ SOLN
INTRAMUSCULAR | Status: AC
  Filled 2019-08-11: qty 20

## 2019-08-11 MED ORDER — SODIUM CHLORIDE 0.9 % IV SOLN: INTRAVENOUS | Status: DC

## 2019-08-11 MED ORDER — FENTANYL CITRATE (PF) 100 MCG/2ML IJ SOLN
INTRAMUSCULAR | Status: AC | PRN
  Administered 2019-08-11 (×2): 50 ug via INTRAVENOUS

## 2019-08-11 MED ORDER — GELATIN ABSORBABLE 12-7 MM EX MISC
CUTANEOUS | Status: AC
  Filled 2019-08-11: qty 1

## 2019-08-11 MED ORDER — MIDAZOLAM HCL 2 MG/2ML IJ SOLN
INTRAMUSCULAR | Status: AC
  Filled 2019-08-11: qty 4

## 2019-08-11 MED ORDER — OXYCODONE HCL 5 MG PO TABS
5.0000 mg | ORAL_TABLET | Freq: Once | ORAL | Status: AC
  Administered 2019-08-11: 5 mg via ORAL
  Filled 2019-08-11 (×2): qty 1

## 2019-08-11 NOTE — Consult Note (Signed)
Chief Complaint: Patient was seen in consultation today for image guided left lobe liver lesion biopsy  Referring Physician(s): Feng,Yan  Supervising Physician: Markus Daft  Patient Status: [facility] - Out-pt  History of Present Illness: Maurice Lowe is a 39 y.o. male with remote history of non-Hodgkin's lymphoma, cirrhosis, chronic hepatitis B, elevated AFP PET scan on 08/08/2019 which revealed:  1. Intensely hypermetabolic infiltrative mass occupying the near entirety of the LEFT hepatic lobe consistent with primary hepatic malignancy. 2. Individual discrete hypermetabolic nodules within the superior aspect of the RIGHT hepatic lobe. 3. Hypermetabolic metastatic adenopathy in the periportal lymph nodes and periaortic lymph nodes upper abdomen. 4. Hypermetabolic lymph node in the precordial space and a RIGHT lower paratracheal lymph node  He presents today for image guided left lobe liver lesion biopsy to rule out [place] versus cholangiocarcinoma  Past Medical History:  Diagnosis Date  . Elevated liver function tests   . Hepatic cyst   . Hepatitis   . Hepatitis B   . Non Hodgkin's lymphoma [name])    age [age]    Past Surgical History:  Procedure Laterality Date  . PORT-A-CATH REMOVAL    . PORTACATH PLACEMENT      Allergies: Phenergan [promethazine]  Medications: Prior to Admission medications   Medication Sig Start Date End Date Taking? Authorizing Provider  entecavir (BARACLUDE) 0.5 MG tablet TAKE 1 TABLET BY MOUTH DAILY 07/25/19  Yes Danis, Estill Cotta III, MD  ondansetron (ZOFRAN) 8 MG tablet Take 1 tablet (8 mg total) by mouth every 8 (eight) hours as needed for nausea or vomiting. 07/30/19  Yes Alla Feeling, NP  oxyCODONE (OXY IR/ROXICODONE) 5 MG immediate release tablet Take 1-2 tablets (5-10 mg total) by mouth every 6 (six) hours as needed for severe pain. 08/05/19  Yes Alla Feeling, NP  dicyclomine (BENTYL) 10 MG capsule Take 1 capsule (10 mg total) by mouth  3 (three) times daily as needed for spasms. Take 1 tablet 30 minutes before breakfast and dinner for 3-4 days then 1 tablet by mouth twice a day. 08/11/19   Noralyn Pick, NP  polyethylene glycol (MIRALAX / GLYCOLAX) 17 g packet Take 17 g by mouth 2 (two) times daily.    [provider]  Sodium Sulfate-Mag Sulfate-KCl (SUTAB PO) Take 1 kit by mouth once. Colonoscopy bowel prep    [provider]  omeprazole (PRILOSEC) 20 MG capsule Take 2 x a day for 7 days then once a day until gone 06/19/19 07/04/19  Raylene Everts, MD     Family History  Problem Relation Age of Onset  . Heart disease Mother   . Stroke Paternal Grandmother   . Hepatitis Neg Hx   . Liver disease Neg Hx   . Colon cancer Neg Hx   . Esophageal cancer Neg Hx   . Stomach cancer Neg Hx   . Pancreatic cancer Neg Hx   . Colon polyps Neg Hx   . Rectal cancer Neg Hx     Social History   Socioeconomic History  . Marital status: Married    Spouse name: Not on file  . Number of children: 3  . Years of education: Not on file  . Highest education level: Not on file  Occupational History  . Occupation: help desk IT  Tobacco Use  . Smoking status: Former Research scientist (life sciences)  . Smokeless tobacco: Never Used  Substance and Sexual Activity  . Alcohol use: Not Currently    Comment: none since admitted  to hospital 12/09/15 when starting Hep B treatment, moderate before quitting   . 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 Sexual activity: Not on file  Other Topics Concern  . Not on file  Social History Narrative  . Not on file   Social Determinants of Health   Financial Resource Strain:   . Difficulty of Paying Living Expenses:   Food Insecurity:   . Worried About Charity fundraiser in the Last Year:   . Arboriculturist in the Last Year:   Transportation Needs:   . Film/video editor (Medical):   [name] Lack of Transportation  (Non-Medical):   Physical Activity:   . Days of Exercise per Week:   . Minutes of Exercise per Session:   Stress:   . Feeling of Stress :   Social Connections:   . Frequency of Communication with Friends and Family:   . Frequency of Social Gatherings with Friends and Family:   . Attends Religious Services:   . Active Member of Clubs or Organizations:   . Attends Archivist Meetings:   [name] Marital Status:      Review of Systems: currently denies fever, headache, chest pain, cough, back pain, or bleeding; he does have some dyspnea with exertion, epigastric discomfort, occasional nausea/ vomiting and weight loss.  Vital Signs: BP 123/77   Pulse 76   Temp 98.5 F (36.9 C) (Oral)   Resp 16   SpO2 99%   Physical Exam awake, alert.  Chest clear to auscultation bilaterally.  Heart with regular rate and rhythm.  Abdomen soft, positive bowel sounds, tender epigastric region to palpation.  No lower extremity edema.  Imaging: MR Abdomen W Wo Contrast  Result Date: 07/26/2019 CLINICAL DATA:  39 year old male with history of chronic hepatitis B infection. Epigastric pain. EXAM: MRI ABDOMEN WITHOUT AND WITH CONTRAST TECHNIQUE: Multiplanar multisequence MR imaging of the abdomen was performed both before and after the administration of intravenous contrast. CONTRAST:  49m GADAVIST GADOBUTROL 1 MMOL/ML IV SOLN COMPARISON:  No priors. FINDINGS: Lower chest: Unremarkable. Hepatobiliary: Liver has a nodular contour, indicative of cirrhosis. Throughout the left lobe of the liver there is a poorly defined infiltrative mass which is best appreciated on fat saturated T2 weighted images where this region measures up to approximately 9.8 x 5.8 x 10.7 cm (axial image 17 of series 4 and coronal image 6 of series 3) involving portions of segments 1, 2, 3, 4A and 4B. This area is T1 hypointense, T2 hyperintense and demonstrates variable degrees of internal enhancement on post gadolinium imaging. Much of this  region demonstrates diffusion restriction. Innumerable other small 2-3 mm T1 hypointense, T2 isointense nonenhancing lesions are noted elsewhere throughout the right lobe of the liver as well, presumably small regenerative micronodules. No intra or extrahepatic biliary ductal dilatation. Gallbladder is normal in appearance. Pancreas: No pancreatic mass. No pancreatic ductal dilatation. No pancreatic or peripancreatic fluid collections or inflammatory changes. Spleen:  Unremarkable. Adrenals/Urinary Tract: Bilateral kidneys and adrenal glands are normal in appearance. No hydroureteronephrosis. Stomach/Bowel: Visualized portions are unremarkable. Vascular/Lymphatic: No aneurysm identified in the visualized abdominal vasculature. No lymphadenopathy noted in the abdomen. Other: No significant volume of ascites noted in the visualized portions of the peritoneal cavity. Musculoskeletal: No aggressive appearing osseous lesions are noted in the visualized portions of the skeleton. IMPRESSION: 1. Large diffusely infiltrative mass centered in the  left lobe of the liver, with indeterminate but very aggressive imaging characteristics, highly concerning for infiltrative tumor considered likely hepatocellular carcinoma. These results will be called to the ordering clinician or representative by the Radiologist Assistant, and communication documented in the PACS or zVision Dashboard. Electronically Signed   By: Vinnie Langton M.D.   On: 07/26/2019 05:51   NM PET Image Initial (PI) Skull Base To Thigh  Result Date: 08/08/2019 CLINICAL DATA:  Initial treatment strategy for hepatocellular carcinoma. Chronic hepatitis B. EXAM: NUCLEAR MEDICINE PET SKULL BASE TO THIGH TECHNIQUE: 6.4 mCi F-18 FDG was injected intravenously. Full-ring PET imaging was performed from the skull base to thigh after the radiotracer. CT data was obtained and used for attenuation correction and anatomic localization. Fasting blood glucose: 104 mg/dl  COMPARISON:  Abdominal MRI 07/25/2019 FINDINGS: Mediastinal blood pool activity: SUV max 1.91 Liver activity: SUV max 2.4 NECK: No hypermetabolic lymph nodes in the neck. Symmetric hypermetabolic activity in the lingual tonsils is favored physiologic. Incidental CT findings: none CHEST: Single small hypermetabolic RIGHT lower paratracheal lymph node with SUV max equal 3.6. This lymph node measures only 5 mm short axis (image 63/4). Hypermetabolic nodule anterior to the RIGHT ventricle in the precordial space with SUV max equal 16.3. Nodule measures 12 mm (image 89/4) No suspicious pulmonary nodules. Incidental CT findings: none ABDOMEN/PELVIS: Large hypermetabolic infiltrative mass involving the near entirety of the LEFT hepatic lobe. Confluent hypermetabolic infiltrative mass with SUV max equal 15.7. There is more discrete individual lesions in the RIGHT hepatic lobe dome SUV max equal 9.4. Enlarged hypermetabolic periportal lymph nodes. These are difficult to define on the noncontrast CT but measures approximately 1.3 cm (image 144/4) with SUV max 17.9. Hypermetabolic lymph node position between the aorta and IVC is only seen on the PET imaging with SUV max equal 4.5. No abnormality the pancreas identified. No discrete pelvic lymphadenopathy. Incidental CT findings: none SKELETON: No focal hypermetabolic activity to suggest skeletal metastasis. Incidental CT findings: none IMPRESSION: 1. Intensely hypermetabolic infiltrative mass occupying the near entirety of the LEFT hepatic lobe consistent with primary hepatic malignancy. 2. Individual discrete hypermetabolic nodules within the superior aspect of the RIGHT hepatic lobe. 3. Hypermetabolic metastatic adenopathy in the periportal lymph nodes and periaortic lymph nodes upper abdomen. 4. Hypermetabolic lymph node in the precordial space and a RIGHT lower paratracheal lymph node. Electronically Signed   By: Suzy Bouchard M.D.   On: 08/08/2019 10:32     Labs:  CBC: Recent Labs    03/26/19 0859 07/03/19 2335 07/25/19 1349 08/11/19 1146  WBC 4.6 8.0 9.2 10.5  HGB 14.7 14.3 13.6 12.0*  HCT 44.5 42.8 41.6 37.1*  PLT 166.0 196 267 325    COAGS: Recent Labs    07/18/19 1518 08/11/19 1146  INR 1.3* 1.1    BMP: Recent Labs    03/26/19 0859 07/03/19 2335 07/25/19 1349 08/11/19 1146  NA 138 136 136 135  K 4.3 3.9 3.6 3.8  CL 102 101 100 96*  CO2 30 24 26 24   GLUCOSE 95 96 103* 87  BUN 13 6 <5* 8  CALCIUM 9.2 9.4 9.3 10.6*  CREATININE 1.00 0.91 0.84 0.81  GFRNONAA  --  >60 >60 >60  GFRAA  --  >60 >60 >60    LIVER FUNCTION TESTS: Recent Labs    07/03/19 2335 07/18/19 1518 07/25/19 1349 08/11/19 1146  BILITOT 1.2 0.7 1.3* 0.7  AST 45* 29 46* 47*  ALT 29 20 34 24  ALKPHOS 75  80 83 102  PROT 7.9 8.6* 9.0* 9.1*  ALBUMIN 4.1 4.0 4.1 3.2*    TUMOR MARKERS: Recent Labs    03/26/19 0859 07/18/19 1557  AFPTM 5.8 116.9*    Assessment and Plan: 39 y.o. male with remote history of non-Hodgkin's lymphoma, cirrhosis, chronic hepatitis B, elevated AFP PET scan on 08/08/2019 which revealed:  1. Intensely hypermetabolic infiltrative mass occupying the near entirety of the LEFT hepatic lobe consistent with primary hepatic malignancy. 2. Individual discrete hypermetabolic nodules within the superior aspect of the RIGHT hepatic lobe. 3. Hypermetabolic metastatic adenopathy in the periportal lymph nodes and periaortic lymph nodes upper abdomen. 4. Hypermetabolic lymph node in the precordial space and a RIGHT lower paratracheal lymph node  He presents today for image guided left lobe liver lesion biopsy to rule out [place] versus cholangiocarcinoma.Risks and benefits of procedure was discussed with the patient  including, but not limited to bleeding, infection, damage to adjacent structures or low yield requiring additional tests.  All of the questions were answered and there is agreement to proceed.  Consent signed  and in chart.     Thank you for this interesting consult.  I greatly enjoyed meeting Kahne Helfand and look forward to participating in their care.  A copy of this report was sent to the requesting provider on this date.  Electronically Signed: D. Rowe Robert, PA-C 08/11/2019, 12:24 PM   I spent a total of 25 minutes  in face to face in clinical consultation, greater than 50% of which was counseling/coordinating care for image guided liver lesion biopsy

## 2019-08-11 NOTE — Procedures (Signed)
Interventional Radiology Procedure:   Indications: Indeterminate liver lesion  Procedure: US guided core biopsy of left hepatic lobe/lesion  Findings: Heterogenous left hepatic lesion, consistent with an infiltrative lesion based on prior imaging.  3 cores obtained and gelfoam slurry injected.  Complications: None     EBL: less than 10 ml  Plan: Bedrest 3 hours.    Ariauna Farabee R. Anselm Pancoast, MD  Pager: [phone]

## 2019-08-11 NOTE — Discharge Instructions (Signed)
For any questions or concerns call [phone]; for after hours call [phone] and ask of on call MD  Liver Biopsy, Care After These instructions give you information on caring for yourself after your procedure. Your doctor may also give you more specific instructions. Call your doctor if you have any problems or questions after your procedure. What can I expect after the procedure? After the procedure, it is common to have:  Pain and soreness where the biopsy was done.  Bruising around the area where the biopsy was done.  Sleepiness and be tired for a few days. Follow these instructions at home: Medicines  Take over-the-counter and prescription medicines only as told by your doctor.  If you were prescribed an antibiotic medicine, take it as told by your doctor. Do not stop taking the antibiotic even if you start to feel better.  Do not take medicines such as aspirin and ibuprofen. These medicines can thin your blood. Do not take these medicines unless your doctor tells you to take them.  If you are taking prescription pain medicine, take actions to prevent or treat constipation. Your doctor may recommend that you: ? Drink enough fluid to keep your pee (urine) clear or pale yellow. ? Take over-the-counter or prescription medicines. ? Eat foods that are high in fiber, such as fresh fruits and vegetables, whole grains, and beans. ? Limit foods that are high in fat and processed sugars, such as fried and sweet foods. Caring for your cut  Follow instructions from your doctor about how to take care of your cuts from surgery (incisions). Make sure you: ? Wash your hands with soap and water before you change your bandage (dressing). If you cannot use soap and water, use hand sanitizer. ? Change your bandage as told by your doctor. ? Leave stitches (sutures), skin glue, or skin tape (adhesive) strips in place. They may need to stay in place for 2 weeks or longer. If tape strips get loose  and curl up, you may trim the loose edges. Do not remove tape strips completely unless your doctor says it is okay.  Check your cuts every day for signs of infection. Check for: ? Redness, swelling, or more pain. ? Fluid or blood. ? Pus or a bad smell. ? Warmth.  Do not take baths, swim, or use a hot tub until your doctor says it is okay to do so. Activity   Rest at home for 1-2 days or as told by your doctor. ? Avoid sitting for a long time without moving. Get up to take short walks every 1-2 hours.  Return to your normal activities as told by your doctor. Ask what activities are safe for you.  Do not do these things in the first 24 hours: ? Drive. ? Use machinery. ? Take a bath or shower.  Do not lift more than 10 pounds (4.5 kg) or play contact sports for the first 2 weeks. General instructions   Do not drink alcohol in the first week after the procedure.  Have someone stay with you for at least 24 hours after the procedure.  Get your test results. Ask your doctor or the department that is doing the test: ? When will my results be ready? ? How will I get my results? ? What are my treatment options? ? What other tests do I need? ? What are my next steps?  Keep all follow-up visits as told by your doctor. This is important. Contact a doctor if:  A cut bleeds and leaves more than just a small spot of blood.  A cut is red, puffs up (swells), or hurts more than before.  Fluid or something else comes from a cut.  A cut smells bad.  You have a fever or chills. Get help right away if:  You have swelling, bloating, or pain in your belly (abdomen).  You get dizzy or faint.  You have a rash.  You feel sick to your stomach (nauseous) or throw up (vomit).  You have trouble breathing, feel short of breath, or feel faint.  Your chest hurts.  You have problems talking or seeing.  You have trouble with your balance or moving your arms or legs. Summary  After the  procedure, it is common to have pain, soreness, bruising, and tiredness.  Your doctor will tell you how to take care of yourself at home. Change your bandage, take your medicines, and limit your activities as told by your doctor.  Call your doctor if you have symptoms of infection. Get help right away if your belly swells, your cut bleeds a lot, or you have trouble talking or breathing. This information is not intended to replace advice given to you by your health care provider. Make sure you discuss any questions you have with your health care provider. Document Revised: 05/18/2017 Document Reviewed: 05/18/2017 Elsevier Patient Education  [place].   Moderate Conscious Sedation, Adult, Care After These instructions provide you with information about caring for yourself after your procedure. Your health care provider may also give you more specific instructions. Your treatment has been planned according to current medical practices, but problems sometimes occur. Call your health care provider if you have any problems or questions after your procedure. What can I expect after the procedure? After your procedure, it is common:  To feel sleepy for several hours.  To feel clumsy and have poor balance for several hours.  To have poor judgment for several hours.  To vomit if you eat too soon. Follow these instructions at home: For at least 24 hours after the procedure:   Do not: ? Participate in activities where you could fall or become injured. ? Drive. ? Use heavy machinery. ? Drink alcohol. ? Take sleeping pills or medicines that cause drowsiness. ? Make important decisions or sign legal documents. ? Take care of children on your own.  Rest. Eating and drinking  Follow the diet recommended by your health care provider.  If you vomit: ? Drink water, juice, or soup when you can drink without vomiting. ? Make sure you have little or no nausea before eating solid  foods. General instructions  Have a responsible adult stay with you until you are awake and alert.  Take over-the-counter and prescription medicines only as told by your health care provider.  If you smoke, do not smoke without supervision.  Keep all follow-up visits as told by your health care provider. This is important. Contact a health care provider if:  You keep feeling nauseous or you keep vomiting.  You feel light-headed.  You develop a rash.  You have a fever. Get help right away if:  You have trouble breathing. This information is not intended to replace advice given to you by your health care provider. Make sure you discuss any questions you have with your health care provider. Document Revised: 04/20/2017 Document Reviewed: 08/28/2015 Elsevier Patient Education  2020 [employer].

## 2019-08-12 ENCOUNTER — Other Ambulatory Visit: Payer: Self-pay | Admitting: Gastroenterology

## 2019-08-12 ENCOUNTER — Ambulatory Visit (INDEPENDENT_AMBULATORY_CARE_PROVIDER_SITE_OTHER): Payer: BC Managed Care – PPO

## 2019-08-12 DIAGNOSIS — Z1159 Encounter for screening for other viral diseases: Secondary | ICD-10-CM | POA: Diagnosis not present

## 2019-08-12 NOTE — Progress Notes (Addendum)
[name]   [phone]) [phone] [phone]   Clinic Follow up Note   Patient Care Team: Patient, No Pcp Per as PCP - General (General Practice) Jonnie Finner, RN as Oncology Nurse Navigator 08/13/2019  CHIEF COMPLAINT: F/u liver mass, likely [place]  SUMMARY OF ONCOLOGIC HISTORY: Oncology History  Hepatocellular carcinoma ([place])  07/30/2019 Initial Diagnosis   Hepatocellular carcinoma ([place])   08/08/2019 -  Chemotherapy   The patient had atezolizumab (TECENTRIQ) 1,200 mg in sodium chloride 0.9 % 250 mL chemo infusion, 1,200 mg, Intravenous, Once, 0 of 6 cycles bevacizumab-bvzr (ZIRABEV) 900 mg in sodium chloride 0.9 % 100 mL chemo infusion, 15 mg/kg = 900 mg, Intravenous,  Once, 0 of 6 cycles  for chemotherapy treatment.      CURRENT THERAPY: PENDING final diagnosis   INTERVAL HISTORY: Maurice Lowe returns for f/u as scheduled. He underwent PET scan on 08/08/19 and liver biopsy on 08/11/19. He is severely constipated today, which is causing most of his discomfort, nausea, and lack of appetite. Feels full quickly. Last BM 3 days ago, he feels much better after. He tried miralax 2-3 times daily with no success. He took an enema that induced a good BM. He tried an oil enema last night and it didn't work, thinks he still has "some left inside." Pain is 6/10 currently in the abdomen. He takes 2 oxycodone that gives him relief for 4 hours, the last 2 hours his pain is moderate to severe. He is very tired and weak. Bathing takes much effort and he is "wiped out" after. He is not out of bed to do much else. Continues to have fever spikes, most recent 100.9. No chills, cough, chest pain, dyspnea, leg swelling, rash, bleeding, or dysuria. He notes his urine is darker since taking oxycodone.    MEDICAL HISTORY:  Past Medical History:  Diagnosis Date  . Elevated liver function tests   . Hepatic cyst   . Hepatitis   . Hepatitis B   . Non Hodgkin's lymphoma [facility])    age  [age]    SURGICAL HISTORY: Past Surgical History:  Procedure Laterality Date  . PORT-A-CATH REMOVAL    . PORTACATH PLACEMENT      I have reviewed the social history and family history with the patient and they are unchanged from previous note.  ALLERGIES:  is allergic to phenergan [promethazine].  MEDICATIONS:  Current Outpatient Medications  Medication Sig Dispense Refill  . entecavir (BARACLUDE) 0.5 MG tablet TAKE 1 TABLET BY MOUTH DAILY 30 tablet 2  . ondansetron (ZOFRAN) 8 MG tablet Take 1 tablet (8 mg total) by mouth every 8 (eight) hours as needed for nausea or vomiting. 45 tablet 1  . oxyCODONE (OXY IR/ROXICODONE) 5 MG immediate release tablet Take 1-2 tablets (5-10 mg total) by mouth every 6 (six) hours as needed for severe pain. 60 tablet 0  . Sodium Sulfate-Mag Sulfate-KCl (SUTAB PO) Take 1 kit by mouth once. Colonoscopy bowel prep    . oxyCODONE (OXYCONTIN) 10 mg 12 hr tablet Take 1 tablet (10 mg total) by mouth every 12 (twelve) hours. 60 tablet 0   No current facility-administered medications for this visit.   Facility-Administered Medications Ordered in Other Visits  Medication Dose Route Frequency Provider Last Rate Last Admin  . fludeoxyglucose F - 18 (FDG) injection 6.4 millicurie  6.4 millicurie Intravenous Once PRN Kellie Simmering T, DO        PHYSICAL EXAMINATION: ECOG PERFORMANCE STATUS: 2-3  Vitals:  08/13/19 0921  BP: 119/78  Pulse: (!) 101  Resp: 18  Temp: 98.5 F (36.9 C)  SpO2: 99%   Filed Weights   08/13/19 0921  Weight: 126 lb 8 oz (57.4 kg)    GENERAL:alert, no distress and comfortable SKIN: no rash  EYES: sclera clear OROPHARYNX: no thrush, ulcers, obvious periodontal disease  LUNGS: clear with normal breathing effort HEART: regular rate & rhythm, no lower extremity edema ABDOMEN: abdomen soft, bowel sounds present. Firmness over liver with mild tenderness to light palpation  NEURO: alert & oriented x 3 with fluent speech, generalized  weakness  LABORATORY DATA:  I have reviewed the data as listed CBC Latest Ref Rng & Units 08/13/2019 08/11/2019 07/25/2019  WBC 4.0 - 10.5 K/uL 11.7(H) 10.5 9.2  Hemoglobin 13.0 - 17.0 g/dL 12.5(L) 12.0(L) 13.6  Hematocrit 39.0 - 52.0 % 38.7(L) 37.1(L) 41.6  Platelets 150 - 400 K/uL 319 325 267     CMP Latest Ref Rng & Units 08/13/2019 08/11/2019 07/25/2019  Glucose 70 - 99 mg/dL 113(H) 87 103(H)  BUN 6 - 20 mg/dL 6 8 <5(L)  Creatinine 0.61 - 1.24 mg/dL 0.83 0.81 0.84  Sodium 135 - 145 mmol/L 135 135 136  Potassium 3.5 - 5.1 mmol/L 4.9 3.8 3.6  Chloride 98 - 111 mmol/L 99 96(L) 100  CO2 22 - 32 mmol/L 27 24 26   Calcium 8.9 - 10.3 mg/dL 12.1(H) 10.6(H) 9.3  Total Protein 6.5 - 8.1 g/dL 9.2(H) 9.1(H) 9.0(H)  Total Bilirubin 0.3 - 1.2 mg/dL 1.0 0.7 1.3(H)  Alkaline Phos 38 - 126 U/L 118 102 83  AST 15 - 41 U/L 56(H) 47(H) 46(H)  ALT 0 - 44 U/L 20 24 34      RADIOGRAPHIC STUDIES: I have personally reviewed the radiological images as listed and agreed with the findings in the report. US BIOPSY (LIVER)  Result Date: 08/11/2019 INDICATION: 39 year old with history of hepatitis-B and infiltrative lesion involving the left hepatic lobe. Tissue diagnosis is needed. Remote history of non-Hodgkin's lymphoma. EXAM: ULTRASOUND-GUIDED LIVER LESION BIOPSY MEDICATIONS: None. ANESTHESIA/SEDATION: Moderate (conscious) sedation was employed during this procedure. A total of Versed 2.0 mg and Fentanyl 100 mcg was administered intravenously. Moderate Sedation Time: 10 minutes. The patient's level of consciousness and vital signs were monitored continuously by radiology nursing throughout the procedure under my direct supervision. FLUOROSCOPY TIME:  None COMPLICATIONS: None immediate. PROCEDURE: Informed written consent was obtained from the patient after a thorough discussion of the procedural risks, benefits and alternatives. All questions were addressed. Maximal Sterile Barrier Technique was utilized including  caps, mask, sterile gowns, sterile gloves, sterile drape, hand hygiene and skin antiseptic. A timeout was performed prior to the initiation of the procedure. Liver was evaluated with ultrasound. Anterior abdomen was prepped with chlorhexidine and sterile field was created. Skin and soft tissues were anesthetized with 1% lidocaine. Using ultrasound guidance, 17 gauge coaxial needle was directed into the left hepatic lobe. Total of 3 core biopsies were obtained with an 18 gauge core device. Specimens placed in formalin. Gel-Foam slurry was injected through the 17 gauge needle as it was removed. Bandage placed over the puncture site. FINDINGS: Left hepatic lobe is diffusely heterogeneous and evidence for poorly defined hypoechoic lesions. Three core biopsies were obtained in this area of heterogeneity. No significant bleeding or hematoma formation following the core biopsies. IMPRESSION: Ultrasound-guided core biopsies of the left hepatic lobe and infiltrative lesion. Electronically Signed   By: Markus Daft M.D.   On: 08/11/2019 14:58  ASSESSMENT & PLAN: 39 yo male with   1. 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 Pt is a 39yo male with chronic hepatitis B on entecavir, liver cirrhosis without evidence of portal hypertension or ascites, followed by GI Dr. Loletha Carrow.  -His Child-Pugh score is 5 (class A).  -He has a 9.8 x5.8 x10.7 cm poorly defined, infiltrative liver mass with findings consistent with HCC, and elevated AFP at 116 (normal 4 months ago), so his diagnosis of HCC is almost certain and appears to have developed rapidly.  -His case was reviewed my multidisciplinary team, and a PET scan and biopsy were recommended  -We reviewed his PET scan today which shows intensely hypermetabolic infiltrative mass occupying nearly entire left lobe, with hypermetabolic nodules in the right lobe. There are  also hypermetabolic periportal, periaortic, precordial, and paratracheal nodes, consistent with nodal metastasis.  -Dr. Barry Dienes reviewed his case, due to the right and left lobe involvement, plus nodal mets, he is not a surgical candidate. He is also not a candidate for liver directed therapy at this time.  -Dr. Burr Medico spoke with pathology, who feels this is liver cancer. Stains are pending.  -Due to the aggressive nature of his disease, Dr. Burr Medico is recommending first line systemic therapy with immunotherapy atezolizumab and biological therapy bevacizumab for disease control. Goal of therapy is palliative. He understands without surgery, his cancer is not curable but is still treatable.  --Chemotherapy consent: Side effects including but not limited to, fatigue, diarrhea, pneumonitis, rash, thyroid dysfunction, proteinuria, HTN, bleeding, thrombosis, rare bowel perforation were discussed with patient in great detail. He agrees to proceed. -He will attend chemo education class today -He prefers to hold on on [facility] placement for now -Over the last 2 weeks Maurice Lowe has more pain and decreased PS. He attributes this to constipation. We reviewed pain control and I have added long acting agent. We reviewed bowel regimen.  -Labs reviewed. WBC 11.9 with neutrophilia.  CMP shows hyercalcemia (corrected) 13.1, he is symptomatic with fatigue and constipation. We are recommending IVF and Zometa today. I reviewed side effects. He has no evidence of periodontal disease. Will repeat labs when he returns later this week for treatment.  -He is being scheduled for cycle 1 on 3/26, will f/u next week and in 3 weeks with cycle 2 -Maurice Lowe was seen with Dr. Burr Medico today, his mother was also present  2. Hypercalcemia of malignancy -Corrected Ca 13.1 today, he is symptomatic with constipation, fatigue, weakness -He will receive IVF and Zometa today. He consented.  -will repeat labs when he returns for treatment on 3/26  3.  Fever spikes -began initially with diagnosis, persistent -low grade fever spikes, 100-100.9 range. No significant chills -clinically he does not look ill, this is likely related to tumor fever -WBC 11.9 with neutrophilia, will check UA/culture and blood culture to r/o underlying infection -No evidence of pulmonary infection on recent PET I do not feel he needs chest xray today. -can fever reducing medication sparingly -will monitor   4. Epigastric pain, secondary to #1 -Previously managed with oxycodone 5 mg PRN, he takes 2-3 tabs per day. -his pain worsened and he takes 2 tabs q6 which is effective for about 4 hours. We discussed long acting pain management. He is interested. We reviewed potential side effects of oxycontin and he agrees to try. Will start 10 mg q12 with oxycodone 5 mg q6  PRN for breakthrough  5. N/V/C, weight loss  -Secondary to #1, constipation related to opioids -We discussed bowel regimen and nutrition  -I prescribed Zofran PRN, and encouraged him to use before meals to promote weight gain and improved nutrition  -Due to worsening pain, he increased oxycodone to 10 mg q6 (2 tabs), he has constipation not responding well to miralax. He does not tolerate mag citrate.  -His constipation is also related to hypercalcemia. He will receive Zometa today with hydration. He will do bowel prep tonight for colonoscopy   -he can eat better with successful BM  6. 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 Loletha Carrow, on Entecavir, he does not know how he contracted Hep B or when exactly -No evidence of ascites on imaging  -he abstains from alcohol, no use in >1 year -Tbili 1-1.3 lately  -EGD to r/o varices and colonoscopy pending, scheduled for 3/25  7. 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 If he takes FMLA, he will lose his job and insurance.  -His wife is starting new job soon, but her insurance may not be that good. -Has 3 sons at home  ages 49, 57, 52 -Will follow closely and refer to SW if needed -not currently working due to his symptoms -Met with Stefanie Libel financial advocate today to discuss [place]   8. History of T-cell non-hodgkins lymphoma, 1994 -s/p chemotherapy (including methotrexate) at [facility]  PLAN: -Labs, path, PET reviewed -UA/culture, blood culture today for fever spikes, will obtain baseline ur protein and TSH as well -Rx: oxycontin 10 mg q12, continue oxy IR for breakthrough PRN (refilled) -Reviewed bowel regimen  -IVF and Zometa today for hypercalcemia of malignancy, symptomatic  -EGD/colonoscopy 3/25  -Return for lab and cycle 1 bevacizumab and atezolizumab q3 weeks, starting 3/26    No problem-specific Assessment & Plan notes found for this encounter.   Orders Placed This Encounter  Procedures  . Urine Culture    Standing Status:   Future    Number of Occurrences:   1    Standing Expiration Date:   08/12/2020  . Culture, Blood    Standing Status:   Future    Number of Occurrences:   1    Standing Expiration Date:   08/12/2020  . Urinalysis, Complete w Microscopic    Standing Status:   Future    Number of Occurrences:   1    Standing Expiration Date:   08/12/2020  . TSH    Standing Status:   Standing    Number of Occurrences:   50    Standing Expiration Date:   08/12/2020  . Total Protein, Urine dipstick    Standing Status:   Standing    Number of Occurrences:   50    Standing Expiration Date:   08/12/2020   All questions were answered. The patient knows to call the clinic with any problems, questions or concerns. No barriers to learning was detected.     Alla Feeling, NP 08/13/19   Addendum  I have seen the patient, examined him. I agree with the assessment and and plan and have edited the notes.   I spoke with pathologist Dr. Saralyn Pilar this morning. His liver biopsy preliminary result is HCC, some IHC results are still pending. I discussed with pt. I reviewed his PET  scan images in person with patient and his family, which unfortunately reviewed bi-lobar disease, and distant node metastasis, unfortunately this is  stage IV disease, and not curable.  He is not a candidate for surgery or liver targeted therapy.  I recommend systemic therapy.  We discussed the option of TKI, immunotherapy, or combination of Tecentriq and bevacizumab, which is probably the most effective regiment for now.  Benefit and potential side effects discussed with him in details, he agrees to proceed, plan to start as soon as possible.  He has developed hypercalcemia, likely related to his underlying malignancy.  I recommend Zometa infusion and IV fluids today and repeating lab next week.  He is also going to have EGD and possible colonoscopy tomorrow.   All questions were ansered. I spent a total of 40 mins for his visit today.   Truitt Merle  08/13/2019

## 2019-08-13 ENCOUNTER — Encounter: Payer: Self-pay | Admitting: Nurse Practitioner

## 2019-08-13 ENCOUNTER — Inpatient Hospital Stay: Payer: BC Managed Care – PPO

## 2019-08-13 ENCOUNTER — Other Ambulatory Visit: Payer: Self-pay

## 2019-08-13 ENCOUNTER — Inpatient Hospital Stay (HOSPITAL_BASED_OUTPATIENT_CLINIC_OR_DEPARTMENT_OTHER): Payer: BC Managed Care – PPO | Admitting: Nurse Practitioner

## 2019-08-13 DIAGNOSIS — R509 Fever, unspecified: Secondary | ICD-10-CM

## 2019-08-13 DIAGNOSIS — Z9221 Personal history of antineoplastic chemotherapy: Secondary | ICD-10-CM | POA: Diagnosis not present

## 2019-08-13 DIAGNOSIS — Z79899 Other long term (current) drug therapy: Secondary | ICD-10-CM | POA: Diagnosis not present

## 2019-08-13 DIAGNOSIS — K7460 Unspecified cirrhosis of liver: Secondary | ICD-10-CM | POA: Diagnosis not present

## 2019-08-13 DIAGNOSIS — C220 Liver cell carcinoma: Secondary | ICD-10-CM

## 2019-08-13 DIAGNOSIS — G893 Neoplasm related pain (acute) (chronic): Secondary | ICD-10-CM | POA: Diagnosis not present

## 2019-08-13 DIAGNOSIS — Z8572 Personal history of non-Hodgkin lymphomas: Secondary | ICD-10-CM | POA: Diagnosis not present

## 2019-08-13 DIAGNOSIS — C779 Secondary and unspecified malignant neoplasm of lymph node, unspecified: Secondary | ICD-10-CM | POA: Diagnosis not present

## 2019-08-13 DIAGNOSIS — K7469 Other cirrhosis of liver: Secondary | ICD-10-CM | POA: Diagnosis not present

## 2019-08-13 DIAGNOSIS — C787 Secondary malignant neoplasm of liver and intrahepatic bile duct: Secondary | ICD-10-CM | POA: Diagnosis not present

## 2019-08-13 DIAGNOSIS — B181 Chronic viral hepatitis B without delta-agent: Secondary | ICD-10-CM | POA: Diagnosis not present

## 2019-08-13 LAB — CBC WITH DIFFERENTIAL (CANCER CENTER ONLY)
Abs Immature Granulocytes: 0.05 10*3/uL (ref 0.00–0.07)
Basophils Absolute: 0 10*3/uL (ref 0.0–0.1)
Basophils Relative: 0 %
Eosinophils Absolute: 0 10*3/uL (ref 0.0–0.5)
Eosinophils Relative: 0 %
HCT: 38.7 % — ABNORMAL LOW (ref 39.0–52.0)
Hemoglobin: 12.5 g/dL — ABNORMAL LOW (ref 13.0–17.0)
Immature Granulocytes: 0 %
Lymphocytes Relative: 16 %
Lymphs Abs: 1.9 10*3/uL (ref 0.7–4.0)
MCH: 27.5 pg (ref 26.0–34.0)
MCHC: 32.3 g/dL (ref 30.0–36.0)
MCV: 85.2 fL (ref 80.0–100.0)
Monocytes Absolute: 1.3 10*3/uL — ABNORMAL HIGH (ref 0.1–1.0)
Monocytes Relative: 11 %
Neutro Abs: 8.4 10*3/uL — ABNORMAL HIGH (ref 1.7–7.7)
Neutrophils Relative %: 73 %
Platelet Count: 319 10*3/uL (ref 150–400)
RBC: 4.54 MIL/uL (ref 4.22–5.81)
RDW: 12.2 % (ref 11.5–15.5)
WBC Count: 11.7 10*3/uL — ABNORMAL HIGH (ref 4.0–10.5)
nRBC: 0 % (ref 0.0–0.2)

## 2019-08-13 LAB — URINALYSIS, COMPLETE (UACMP) WITH MICROSCOPIC
Bilirubin Urine: NEGATIVE
Glucose, UA: NEGATIVE mg/dL
Hgb urine dipstick: NEGATIVE
Ketones, ur: NEGATIVE mg/dL
Leukocytes,Ua: NEGATIVE
Nitrite: NEGATIVE
Protein, ur: NEGATIVE mg/dL
Specific Gravity, Urine: 1.014 (ref 1.005–1.030)
pH: 7 (ref 5.0–8.0)

## 2019-08-13 LAB — TSH: TSH: 0.779 u[IU]/mL (ref 0.320–4.118)

## 2019-08-13 LAB — CMP (CANCER CENTER ONLY)
ALT: 20 U/L (ref 0–44)
AST: 56 U/L — ABNORMAL HIGH (ref 15–41)
Albumin: 2.7 g/dL — ABNORMAL LOW (ref 3.5–5.0)
Alkaline Phosphatase: 118 U/L (ref 38–126)
Anion gap: 9 (ref 5–15)
BUN: 6 mg/dL (ref 6–20)
CO2: 27 mmol/L (ref 22–32)
Calcium: 12.1 mg/dL — ABNORMAL HIGH (ref 8.9–10.3)
Chloride: 99 mmol/L (ref 98–111)
Creatinine: 0.83 mg/dL (ref 0.61–1.24)
GFR, Est AFR Am: >60 mL/min (ref >60)
GFR, Estimated: >60 mL/min (ref >60)
Glucose, Bld: 113 mg/dL — ABNORMAL HIGH (ref 70–99)
Potassium: 4.9 mmol/L (ref 3.5–5.1)
Sodium: 135 mmol/L (ref 135–145)
Total Bilirubin: 1 mg/dL (ref 0.3–1.2)
Total Protein: 9.2 g/dL — ABNORMAL HIGH (ref 6.5–8.1)

## 2019-08-13 LAB — SARS CORONAVIRUS 2 (TAT 6-24 HRS): SARS Coronavirus 2: NEGATIVE

## 2019-08-13 MED ORDER — ZOLEDRONIC ACID 4 MG/100ML IV SOLN
4.0000 mg | Freq: Once | INTRAVENOUS | Status: AC
  Administered 2019-08-13: 4 mg via INTRAVENOUS

## 2019-08-13 MED ORDER — ZOLEDRONIC ACID 4 MG/100ML IV SOLN
INTRAVENOUS | Status: AC
  Filled 2019-08-13: qty 100

## 2019-08-13 MED ORDER — OXYCODONE HCL 5 MG PO TABS: 5.0000 mg | ORAL_TABLET | Freq: Four times a day (QID) | ORAL | 0 refills | Status: DC | PRN

## 2019-08-13 MED ORDER — SODIUM CHLORIDE 0.9 % IV SOLN
Freq: Once | INTRAVENOUS | Status: AC
  Filled 2019-08-13: qty 250

## 2019-08-13 MED ORDER — OXYCODONE HCL ER 10 MG PO T12A: 10.0000 mg | EXTENDED_RELEASE_TABLET | Freq: Two times a day (BID) | ORAL | 0 refills | Status: DC

## 2019-08-13 NOTE — Patient Instructions (Addendum)
**Follow bowel prep for colonoscopy first.  If constipation persists pick up Dulcolax (Bisacodyl)--it's a stool softner/gentle laxative--from drug store and take daily. Call Dr. Audie Box if no improvement after taking**  Zoledronic Acid injection (Hypercalcemia, Oncology) What is this medicine? ZOLEDRONIC ACID (ZOE le dron ik AS id) lowers the amount of calcium loss from bone. It is used to treat too much calcium in your blood from cancer. It is also used to prevent complications of cancer that has spread to the bone. This medicine may be used for other purposes; ask your health care provider or pharmacist if you have questions. COMMON BRAND NAME(S): Zometa What should I tell my health care provider before I take this medicine? They need to know if you have any of these conditions:  aspirin-sensitive asthma  cancer, especially if you are receiving medicines used to treat cancer  dental disease or wear dentures  infection  kidney disease  receiving corticosteroids like dexamethasone or prednisone  an unusual or allergic reaction to zoledronic acid, other medicines, foods, dyes, or preservatives  pregnant or trying to get pregnant  breast-feeding How should I use this medicine? This medicine is for infusion into a vein. It is given by a health care professional in a hospital or clinic setting. Talk to your pediatrician regarding the use of this medicine in children. Special care may be needed. Overdosage: If you think you have taken too much of this medicine contact a poison control center or emergency room at once. NOTE: This medicine is only for you. Do not share this medicine with others. What if I miss a dose? It is important not to miss your dose. Call your doctor or health care professional if you are unable to keep an appointment. What may interact with this medicine?  certain antibiotics given by injection  NSAIDs, medicines for pain and inflammation, like ibuprofen or  naproxen  some diuretics like bumetanide, furosemide  teriparatide  thalidomide This list may not describe all possible interactions. Give your health care provider a list of all the medicines, herbs, non-prescription drugs, or dietary supplements you use. Also tell them if you smoke, drink alcohol, or use illegal drugs. Some items may interact with your medicine. What should I watch for while using this medicine? Visit your doctor or health care professional for regular checkups. It may be some time before you see the benefit from this medicine. Do not stop taking your medicine unless your doctor tells you to. Your doctor may order blood tests or other tests to see how you are doing. Women should inform their doctor if they wish to become pregnant or think they might be pregnant. There is a potential for serious side effects to an unborn child. Talk to your health care professional or pharmacist for more information. You should make sure that you get enough calcium and vitamin D while you are taking this medicine. Discuss the foods you eat and the vitamins you take with your health care professional. Some people who take this medicine have severe bone, joint, and/or muscle pain. This medicine may also increase your risk for jaw problems or a broken thigh bone. Tell your doctor right away if you have severe pain in your jaw, bones, joints, or muscles. Tell your doctor if you have any pain that does not go away or that gets worse. Tell your dentist and dental surgeon that you are taking this medicine. You should not have major dental surgery while on this medicine. See your dentist to  have a dental exam and fix any dental problems before starting this medicine. Take good care of your teeth while on this medicine. Make sure you see your dentist for regular follow-up appointments. What side effects may I notice from receiving this medicine? Side effects that you should report to your doctor or health  care professional as soon as possible:  allergic reactions like skin rash, itching or hives, swelling of the face, lips, or tongue  anxiety, confusion, or depression  breathing problems  changes in vision  eye pain  feeling faint or lightheaded, falls  jaw pain, especially after dental work  mouth sores  muscle cramps, stiffness, or weakness  redness, blistering, peeling or loosening of the skin, including inside the mouth  trouble passing urine or change in the amount of urine Side effects that usually do not require medical attention (report to your doctor or health care professional if they continue or are bothersome):  bone, joint, or muscle pain  constipation  diarrhea  fever  hair loss  irritation at site where injected  loss of appetite  nausea, vomiting  stomach upset  trouble sleeping  trouble swallowing  weak or tired This list may not describe all possible side effects. Call your doctor for medical advice about side effects. You may report side effects to FDA at 1-800-FDA-1088. Where should I keep my medicine? This drug is given in a hospital or clinic and will not be stored at home. NOTE: This sheet is a summary. It may not cover all possible information. If you have questions about this medicine, talk to your doctor, pharmacist, or health care provider.  2020 Elsevier/Gold Standard (2013-10-04 14:19:39)  Rehydration, Adult Rehydration is the replacement of body fluids and salts and minerals (electrolytes) that are lost during dehydration. Dehydration is when there is not enough fluid or water in the body. This happens when you lose more fluids than you take in. Common causes of dehydration include:  Vomiting.  Diarrhea.  Excessive sweating, such as from heat exposure or exercise.  Taking medicines that cause the body to lose excess fluid (diuretics).  Impaired kidney function.  Not drinking enough fluid.  Certain illnesses or  infections.  Certain poorly controlled long-term (chronic) illnesses, such as diabetes, heart disease, and kidney disease.  Symptoms of mild dehydration may include thirst, dry lips and mouth, dry skin, and dizziness. Symptoms of severe dehydration may include increased heart rate, confusion, fainting, and not urinating. You can rehydrate by drinking certain fluids or getting fluids through an IV tube, as told by your health care provider. What are the risks? Generally, rehydration is safe. However, one problem that can happen is taking in too much fluid (overhydration). This is rare. If overhydration happens, it can cause an electrolyte imbalance, kidney failure, or a decrease in salt (sodium) levels in the body. How to rehydrate Follow instructions from your health care provider for rehydration. The kind of fluid you should drink and the amount you should drink depend on your condition.  If directed by your health care provider, drink an oral rehydration solution (ORS). This is a drink designed to treat dehydration that is found in pharmacies and retail stores. ? Make an ORS by following instructions on the package. ? Start by drinking small amounts, about  cup (120 mL) every 5-10 minutes. ? Slowly increase how much you drink until you have taken the amount recommended by your health care provider.  Drink enough clear fluids to keep your urine clear or  pale yellow. If you were instructed to drink an ORS, finish the ORS first, then start slowly drinking other clear fluids. Drink fluids such as: ? Water. Do not drink only water. Doing that can lead to having too little sodium in your body (hyponatremia). ? Ice chips. ? Fruit juice that you have added water to (diluted juice). ? Low-calorie sports drinks.  If you are severely dehydrated, your health care provider may recommend that you receive fluids through an IV tube in the hospital.  Do not take sodium tablets. Doing that can lead to the  condition of having too much sodium in your body (hypernatremia). Eating while you rehydrate Follow instructions from your health care provider about what to eat while you rehydrate. Your health care provider may recommend that you slowly begin eating regular foods in small amounts.  Eat foods that contain a healthy balance of electrolytes, such as bananas, oranges, potatoes, tomatoes, and spinach.  Avoid foods that are greasy or contain a lot of fat or sugar.  In some cases, you may get nutrition through a feeding tube that is passed through your nose and into your stomach (nasogastric tube, or NG tube). This may be done if you have uncontrolled vomiting or diarrhea. Beverages to avoid Certain beverages may make dehydration worse. While you rehydrate, avoid:  Alcohol.  Caffeine.  Drinks that contain a lot of sugar. These include: ? High-calorie sports drinks. ? Fruit juice that is not diluted. ? Soda.  Check nutrition labels to see how much sugar or caffeine a beverage contains. Signs of dehydration recovery You may be recovering from dehydration if:  You are urinating more often than before you started rehydrating.  Your urine is clear or pale yellow.  Your energy level improves.  You vomit less frequently.  You have diarrhea less frequently.  Your appetite improves or returns to normal.  You feel less dizzy or less light-headed.  Your skin tone and color start to look more normal. Contact a health care provider if:  You continue to have symptoms of mild dehydration, such as: ? Thirst. ? Dry lips. ? Slightly dry mouth. ? Dry, warm skin. ? Dizziness.  You continue to vomit or have diarrhea. Get help right away if:  You have symptoms of dehydration that get worse.  You feel: ? Confused. ? Weak. ? Like you are going to faint.  You have not urinated in 6-8 hours.  You have very dark urine.  You have trouble breathing.  Your heart rate while sitting still  is over 100 beats a minute.  You cannot drink fluids without vomiting.  You have vomiting or diarrhea that: ? Gets worse. ? Does not go away.  You have a fever. This information is not intended to replace advice given to you by your health care provider. Make sure you discuss any questions you have with your health care provider. Document Revised: 04/20/2017 Document Reviewed: 07/02/2015 Elsevier Patient Education  2020 [employer].

## 2019-08-13 NOTE — Progress Notes (Signed)
Met with patient/accompanying guest at registration to introduce myself as Arboriculturist and to offer available resources.  Discussed one-time $1000 Radio broadcast assistant to assist with personal expenses while going through treatment.  Gave him my card if interested in applying and for any additional financial questions or concerns.

## 2019-08-14 ENCOUNTER — Encounter: Payer: Self-pay | Admitting: Gastroenterology

## 2019-08-14 ENCOUNTER — Ambulatory Visit (AMBULATORY_SURGERY_CENTER): Payer: BC Managed Care – PPO | Admitting: Gastroenterology

## 2019-08-14 ENCOUNTER — Encounter: Payer: Self-pay | Admitting: Nurse Practitioner

## 2019-08-14 ENCOUNTER — Telehealth: Payer: Self-pay | Admitting: Nurse Practitioner

## 2019-08-14 VITALS — BP 114/65 | HR 78 | Temp 97.5°F | Resp 13 | Ht 66.0 in | Wt 128.0 lb

## 2019-08-14 DIAGNOSIS — R160 Hepatomegaly, not elsewhere classified: Secondary | ICD-10-CM

## 2019-08-14 DIAGNOSIS — R933 Abnormal findings on diagnostic imaging of other parts of digestive tract: Secondary | ICD-10-CM | POA: Diagnosis not present

## 2019-08-14 DIAGNOSIS — C229 Malignant neoplasm of liver, not specified as primary or secondary: Secondary | ICD-10-CM | POA: Diagnosis not present

## 2019-08-14 LAB — SURGICAL PATHOLOGY

## 2019-08-14 LAB — AFP TUMOR MARKER: AFP, Serum, Tumor Marker: 654 ng/mL — ABNORMAL HIGH (ref 0.0–8.3)

## 2019-08-14 LAB — URINE CULTURE: Culture: NO GROWTH

## 2019-08-14 MED ORDER — SODIUM CHLORIDE 0.9 % IV SOLN: 500.0000 mL | Freq: Once | INTRAVENOUS | Status: AC

## 2019-08-14 NOTE — Op Note (Signed)
[place] Patient Name: Maurice Lowe Procedure Date: 08/14/2019 9:21 AM MRN: [id number] Endoscopist: [place]. Loletha Carrow , MD Age: 39 Referring MD:  Date of Birth: August 04, 1980 Gender: Male Account #: [id number] Procedure:                Colonoscopy Indications:              Abnormal CT of the GI tract (liver mass, probable                            HCC, oncology requested EGD/colonoscopy to rule out                            primary GI maligancy) Medicines:                Monitored Anesthesia Care Procedure:                Pre-Anesthesia Assessment:                           - Prior to the procedure, a History and Physical                            was performed, and patient medications and                            allergies were reviewed. The patient's tolerance of                            previous anesthesia was also reviewed. The risks                            and benefits of the procedure and the sedation                            options and risks were discussed with the patient.                            All questions were answered, and informed consent                            was obtained. Prior Anticoagulants: The patient has                            taken no previous anticoagulant or antiplatelet                            agents. ASA Grade Assessment: III - A patient with                            severe systemic disease. After reviewing the risks                            and benefits, the patient was deemed in  satisfactory condition to undergo the procedure.                           - Prior to the procedure, a History and Physical                            was performed, and patient medications and                            allergies were reviewed. The patient's tolerance of                            previous anesthesia was also reviewed. The risks                            and benefits of the procedure and the  sedation                            options and risks were discussed with the patient.                            All questions were answered, and informed consent                            was obtained. Prior Anticoagulants: The patient has                            taken no previous anticoagulant or antiplatelet                            agents. ASA Grade Assessment: III - A patient with                            severe systemic disease. After reviewing the risks                            and benefits, the patient was deemed in                            satisfactory condition to undergo the procedure.                           After obtaining informed consent, the colonoscope                            was passed under direct vision. Throughout the                            procedure, the patient's blood pressure, pulse, and                            oxygen saturations were monitored continuously. The  Colonoscope was introduced through the anus and                            advanced to the the cecum, identified by                            appendiceal orifice and ileocecal valve. The                            colonoscopy was performed without difficulty. The                            patient tolerated the procedure well. The quality                            of the bowel preparation was good. The ileocecal                            valve, appendiceal orifice, and rectum were                            photographed. The bowel preparation used was Sutab. Scope In: 9:29:19 AM Scope Out: 9:43:22 AM Scope Withdrawal Time: 0 hours 10 minutes 41 seconds  Total Procedure Duration: 0 hours 14 minutes 3 seconds  Findings:                 The perianal and digital rectal examinations were                            normal.                           The entire examined colon appeared normal on direct                            and retroflexion  views. Complications:            No immediate complications. Estimated Blood Loss:     Estimated blood loss: none. Impression:               - The entire examined colon is normal on direct and                            retroflexion views.                           - No specimens collected. Recommendation:           - Patient has a contact number available for                            emergencies. The signs and symptoms of potential                            delayed complications were discussed with the  patient. Return to normal activities tomorrow.                            Written discharge instructions were provided to the                            patient.                           - Resume previous diet.                           - Continue present medications.                           - No recommendation at this time regarding repeat                            colonoscopy. Pollyann Roa L. Loletha Carrow, MD 08/14/2019 9:58:54 AM This report has been signed electronically.

## 2019-08-14 NOTE — Telephone Encounter (Signed)
Scheduled appt per 3/24 los.  Spoke with pt and he is aware of his appt date and time.

## 2019-08-14 NOTE — Progress Notes (Signed)
Pharmacist Chemotherapy Monitoring - Initial Assessment    Anticipated start date: 08/15/19   Regimen:  . Are orders appropriate based on the patient's diagnosis, regimen, and cycle? Yes . Does the plan date match the patient's scheduled date? Yes . Is the sequencing of drugs appropriate? Yes . Are the premedications appropriate for the patient's regimen? Yes . Prior Authorization for treatment is: Pending o If applicable, is the correct biosimilar selected based on the patient's insurance? yes  Organ Function and Labs: [name] Are dose adjustments needed based on the patient's renal function, hepatic function, or hematologic function? No . Are appropriate labs ordered prior to the start of patient's treatment? Yes . Other organ system assessment, if indicated: bevacizumab: baseline BP . The following baseline labs, if indicated, have been ordered: atezolizumab: baseline TSH +/- T4 and bevacizumab: urine protein  Dose Assessment: . Are the drug doses appropriate? Yes . Are the following correct: o Drug concentrations Yes o IV fluid compatible with drug Yes o Administration routes Yes o Timing of therapy Yes . If applicable, does the patient have documented access for treatment and/or plans for port-a-cath placement? no . If applicable, have lifetime cumulative doses been properly documented and assessed? not applicable Lifetime Dose Tracking  No doses have been documented on this patient for the following tracked chemicals: Doxorubicin, Epirubicin, Idarubicin, Daunorubicin, Mitoxantrone, Bleomycin, Oxaliplatin, Carboplatin, Liposomal Doxorubicin  o   Toxicity Monitoring/Prevention: . The patient has the following take home antiemetics prescribed: Ondansetron . The patient has the following take home medications prescribed: N/A . Medication allergies and previous infusion related reactions, if applicable, have been reviewed and addressed. Yes . The patient's current medication list has  been assessed for drug-drug interactions with their chemotherapy regimen. no significant drug-drug interactions were identified on review.  Order Review: . Are the treatment plan orders signed? Yes . Is the patient scheduled to see a provider prior to their treatment? No  I verify that I have reviewed each item in the above checklist and answered each question accordingly.  Norwood Levo [facility] 08/14/2019 11:05 AM

## 2019-08-14 NOTE — Op Note (Signed)
[place] Patient Name: Maurice Lowe Procedure Date: 08/14/2019 9:16 AM MRN: [id number] Endoscopist: [place]. Loletha Carrow , MD Age: 39 Referring MD:  Date of Birth: [birth year] Gender: Male Account #: [id number] Procedure:                Upper GI endoscopy Indications:              Abnormal CT of the GI tract (liver mass, probable                            HCC, oncology requested EGD/colonoscopy to rule out                            primary GI malignancy) Medicines:                Monitored Anesthesia Care Procedure:                Pre-Anesthesia Assessment:                           - Prior to the procedure, a History and Physical                            was performed, and patient medications and                            allergies were reviewed. The patient's tolerance of                            previous anesthesia was also reviewed. The risks                            and benefits of the procedure and the sedation                            options and risks were discussed with the patient.                            All questions were answered, and informed consent                            was obtained. Prior Anticoagulants: The patient has                            taken no previous anticoagulant or antiplatelet                            agents. ASA Grade Assessment: III - A patient with                            severe systemic disease. After reviewing the risks                            and benefits, the patient was deemed in  satisfactory condition to undergo the procedure.                           After obtaining informed consent, the endoscope was                            passed under direct vision. Throughout the                            procedure, the patient's blood pressure, pulse, and                            oxygen saturations were monitored continuously. The                            Endoscope was introduced  through the mouth, and                            advanced to the second part of duodenum. The upper                            GI endoscopy was accomplished without difficulty.                            The patient tolerated the procedure well. Scope In: Scope Out: Findings:                 The esophagus was normal.                           The stomach was normal.                           The cardia and gastric fundus were normal on                            retroflexion.                           The examined duodenum was normal. Complications:            No immediate complications. Estimated Blood Loss:     Estimated blood loss: none. Impression:               - Normal esophagus.                           - Normal stomach.                           - Normal examined duodenum.                           - No specimens collected. Recommendation:           - Patient has a contact number available for  emergencies. The signs and symptoms of potential                            delayed complications were discussed with the                            patient. Return to normal activities tomorrow.                            Written discharge instructions were provided to the                            patient.                           - Resume previous diet.                           - Continue present medications.                           - See the other procedure note for documentation of                            additional recommendations. Maurice Lowe L. Loletha Carrow, MD 08/14/2019 9:56:59 AM This report has been signed electronically.

## 2019-08-14 NOTE — Progress Notes (Signed)
pt tolerated well. VSS. awake and to recovery. Report given to RN. Oral bite block placed and removed without trauma. 

## 2019-08-14 NOTE — Patient Instructions (Signed)
YOU HAD AN ENDOSCOPIC PROCEDURE TODAY AT THE [facility]:   Refer to the procedure report that was given to you for any specific questions about what was found during the examination.  If the procedure report does not answer your questions, please call your gastroenterologist to clarify.  If you requested that your care partner not be given the details of your procedure findings, then the procedure report has been included in a sealed envelope for you to review at your convenience later.  YOU SHOULD EXPECT: Some feelings of bloating in the abdomen. Passage of more gas than usual.  Walking can help get rid of the air that was put into your GI tract during the procedure and reduce the bloating. If you had a lower endoscopy (such as a colonoscopy or flexible sigmoidoscopy) you may notice spotting of blood in your stool or on the toilet paper. If you underwent a bowel prep for your procedure, you may not have a normal bowel movement for a few days.  Please Note:  You might notice some irritation and congestion in your nose or some drainage.  This is from the oxygen used during your procedure.  There is no need for concern and it should clear up in a day or so.  SYMPTOMS TO REPORT IMMEDIATELY:   Following lower endoscopy (colonoscopy or flexible sigmoidoscopy):  Excessive amounts of blood in the stool  Significant tenderness or worsening of abdominal pains  Swelling of the abdomen that is new, acute  Fever of 100F or higher   Following upper endoscopy (EGD)  Vomiting of blood or coffee ground material  New chest pain or pain under the shoulder blades  Painful or persistently difficult swallowing  New shortness of breath  Fever of 100F or higher  Black, tarry-looking stools  For urgent or emergent issues, a gastroenterologist can be reached at any hour by calling [phone]. Do not use MyChart messaging for urgent concerns.    DIET:  We do recommend a small meal at first, but  then you may proceed to your regular diet.  Drink plenty of fluids but you should avoid alcoholic beverages for 24 hours.  ACTIVITY:  You should plan to take it easy for the rest of today and you should NOT DRIVE or use heavy machinery until tomorrow (because of the sedation medicines used during the test).    FOLLOW UP: Our staff will call the number listed on your records 48-72 hours following your procedure to check on you and address any questions or concerns that you may have regarding the information given to you following your procedure. If we do not reach you, we will leave a message.  We will attempt to reach you two times.  During this call, we will ask if you have developed any symptoms of COVID 19. If you develop any symptoms (ie: fever, flu-like symptoms, shortness of breath, cough etc.) before then, please call [phone].  If you test positive for Covid 19 in the 2 weeks post procedure, please call and report this information to us.    If any biopsies were taken you will be contacted by phone or by letter within the next 1-3 weeks.  Please call us at [phone] if you have not heard about the biopsies in 3 weeks.    SIGNATURES/CONFIDENTIALITY: You and/or your care partner have signed paperwork which will be entered into your electronic medical record.  These signatures attest to the fact that that the information above on   your After Visit Summary has been reviewed and is understood.  Full responsibility of the confidentiality of this discharge information lies with you and/or your care-partner. 

## 2019-08-15 ENCOUNTER — Encounter: Payer: Self-pay | Admitting: Hematology

## 2019-08-15 ENCOUNTER — Inpatient Hospital Stay (HOSPITAL_BASED_OUTPATIENT_CLINIC_OR_DEPARTMENT_OTHER): Payer: BC Managed Care – PPO | Admitting: Hematology

## 2019-08-15 ENCOUNTER — Other Ambulatory Visit: Payer: Self-pay | Admitting: Medical Oncology

## 2019-08-15 ENCOUNTER — Other Ambulatory Visit: Payer: Self-pay

## 2019-08-15 ENCOUNTER — Inpatient Hospital Stay: Payer: BC Managed Care – PPO

## 2019-08-15 VITALS — BP 109/66 | HR 98 | Temp 100.4°F | Resp 12

## 2019-08-15 DIAGNOSIS — C220 Liver cell carcinoma: Secondary | ICD-10-CM

## 2019-08-15 DIAGNOSIS — Z8572 Personal history of non-Hodgkin lymphomas: Secondary | ICD-10-CM | POA: Diagnosis not present

## 2019-08-15 DIAGNOSIS — K7460 Unspecified cirrhosis of liver: Secondary | ICD-10-CM | POA: Diagnosis not present

## 2019-08-15 DIAGNOSIS — Z9221 Personal history of antineoplastic chemotherapy: Secondary | ICD-10-CM | POA: Diagnosis not present

## 2019-08-15 DIAGNOSIS — Z79899 Other long term (current) drug therapy: Secondary | ICD-10-CM | POA: Diagnosis not present

## 2019-08-15 DIAGNOSIS — C787 Secondary malignant neoplasm of liver and intrahepatic bile duct: Secondary | ICD-10-CM

## 2019-08-15 DIAGNOSIS — K7469 Other cirrhosis of liver: Secondary | ICD-10-CM | POA: Diagnosis not present

## 2019-08-15 DIAGNOSIS — G893 Neoplasm related pain (acute) (chronic): Secondary | ICD-10-CM | POA: Diagnosis not present

## 2019-08-15 DIAGNOSIS — Z7189 Other specified counseling: Secondary | ICD-10-CM

## 2019-08-15 DIAGNOSIS — B181 Chronic viral hepatitis B without delta-agent: Secondary | ICD-10-CM | POA: Diagnosis not present

## 2019-08-15 DIAGNOSIS — C779 Secondary and unspecified malignant neoplasm of lymph node, unspecified: Secondary | ICD-10-CM | POA: Diagnosis not present

## 2019-08-15 LAB — CMP (CANCER CENTER ONLY)
ALT: 19 U/L (ref 0–44)
AST: 62 U/L — ABNORMAL HIGH (ref 15–41)
Albumin: 2.4 g/dL — ABNORMAL LOW (ref 3.5–5.0)
Alkaline Phosphatase: 103 U/L (ref 38–126)
Anion gap: 10 (ref 5–15)
BUN: 6 mg/dL (ref 6–20)
CO2: 22 mmol/L (ref 22–32)
Calcium: 8.9 mg/dL (ref 8.9–10.3)
Chloride: 101 mmol/L (ref 98–111)
Creatinine: 0.73 mg/dL (ref 0.61–1.24)
GFR, Est AFR Am: >60 mL/min (ref >60)
GFR, Estimated: >60 mL/min (ref >60)
Glucose, Bld: 98 mg/dL (ref 70–99)
Potassium: 3.7 mmol/L (ref 3.5–5.1)
Sodium: 133 mmol/L — ABNORMAL LOW (ref 135–145)
Total Bilirubin: 0.5 mg/dL (ref 0.3–1.2)
Total Protein: 8.4 g/dL — ABNORMAL HIGH (ref 6.5–8.1)

## 2019-08-15 LAB — CBC WITH DIFFERENTIAL (CANCER CENTER ONLY)
Abs Immature Granulocytes: 0.11 10*3/uL — ABNORMAL HIGH (ref 0.00–0.07)
Basophils Absolute: 0 10*3/uL (ref 0.0–0.1)
Basophils Relative: 0 %
Eosinophils Absolute: 0 10*3/uL (ref 0.0–0.5)
Eosinophils Relative: 0 %
HCT: 31.8 % — ABNORMAL LOW (ref 39.0–52.0)
Hemoglobin: 10.8 g/dL — ABNORMAL LOW (ref 13.0–17.0)
Immature Granulocytes: 1 %
Lymphocytes Relative: 5 %
Lymphs Abs: 0.5 10*3/uL — ABNORMAL LOW (ref 0.7–4.0)
MCH: 28.6 pg (ref 26.0–34.0)
MCHC: 34 g/dL (ref 30.0–36.0)
MCV: 84.1 fL (ref 80.0–100.0)
Monocytes Absolute: 0.6 10*3/uL (ref 0.1–1.0)
Monocytes Relative: 6 %
Neutro Abs: 7.8 10*3/uL — ABNORMAL HIGH (ref 1.7–7.7)
Neutrophils Relative %: 88 %
Platelet Count: 292 10*3/uL (ref 150–400)
RBC: 3.78 MIL/uL — ABNORMAL LOW (ref 4.22–5.81)
RDW: 12.6 % (ref 11.5–15.5)
WBC Count: 9 10*3/uL (ref 4.0–10.5)
nRBC: 0 % (ref 0.0–0.2)

## 2019-08-15 LAB — TSH: TSH: 1.01 u[IU]/mL (ref 0.320–4.118)

## 2019-08-15 MED ORDER — LACTULOSE 20 GM/30ML PO SOLN: 30.0000 mL | ORAL | 1 refills | Status: AC | PRN

## 2019-08-15 MED ORDER — ONDANSETRON HCL 8 MG PO TABS: 8.0000 mg | ORAL_TABLET | Freq: Three times a day (TID) | ORAL | 1 refills | Status: DC | PRN

## 2019-08-15 MED ORDER — SODIUM CHLORIDE 0.9 % IV SOLN
1200.0000 mg | Freq: Once | INTRAVENOUS | Status: AC
  Administered 2019-08-15: 1200 mg via INTRAVENOUS
  Filled 2019-08-15: qty 20

## 2019-08-15 MED ORDER — SODIUM CHLORIDE 0.9 % IV SOLN
INTRAVENOUS | Status: DC
  Filled 2019-08-15: qty 250

## 2019-08-15 MED ORDER — LEVOFLOXACIN 750 MG PO TABS: 750.0000 mg | ORAL_TABLET | Freq: Every day | ORAL | 0 refills | Status: DC

## 2019-08-15 MED ORDER — SODIUM CHLORIDE 0.9 % IV SOLN
15.0000 mg/kg | Freq: Once | INTRAVENOUS | Status: AC
  Administered 2019-08-15: 11:00:00 900 mg via INTRAVENOUS
  Filled 2019-08-15: qty 32

## 2019-08-15 MED ORDER — SODIUM CHLORIDE 0.9 % IV SOLN
Freq: Once | INTRAVENOUS | Status: AC
  Filled 2019-08-15: qty 250

## 2019-08-15 NOTE — Patient Instructions (Signed)
[facility] Discharge Instructions for Patients Receiving Chemotherapy  Today you received the following Immunotherapy agents: Atezolizumab, Bevacizumab  To help prevent nausea and vomiting after your treatment, we encourage you to take your nausea medication as directed by your MD.   If you develop nausea and vomiting that is not controlled by your nausea medication, call the clinic.   BELOW ARE SYMPTOMS THAT SHOULD BE REPORTED IMMEDIATELY:  *FEVER GREATER THAN 100.5 F  *CHILLS WITH OR WITHOUT FEVER  NAUSEA AND VOMITING THAT IS NOT CONTROLLED WITH YOUR NAUSEA MEDICATION  *UNUSUAL SHORTNESS OF BREATH  *UNUSUAL BRUISING OR BLEEDING  TENDERNESS IN MOUTH AND THROAT WITH OR WITHOUT PRESENCE OF ULCERS  *URINARY PROBLEMS  *BOWEL PROBLEMS  UNUSUAL RASH Items with * indicate a potential emergency and should be followed up as soon as possible.  Feel free to call the clinic should you have any questions or concerns. The clinic phone number is [phone].  Please show the [facility] at check-in to the Emergency Department and triage nurse.  Coronavirus (COVID-19) Are you at risk?  Are you at risk for the Coronavirus (COVID-19)?  To be considered HIGH RISK for Coronavirus (COVID-19), you have to meet the following criteria:  . Traveled to [place], [place], [place], [place] or [place]; or in the [place] to [place], [place], [place], or [place]; and have fever, cough, and shortness of breath within the last 2 weeks of travel OR . Been in close contact with a person diagnosed with COVID-19 within the last 2 weeks and have fever, cough, and shortness of breath . IF YOU DO NOT MEET THESE CRITERIA, YOU ARE CONSIDERED LOW RISK FOR COVID-19.  What to do if you are HIGH RISK for COVID-19?  [name] If you are having a medical emergency, call 911. . Seek medical care right away. Before you go to a doctor's office, urgent care or emergency department, call ahead  and tell them about your recent travel, contact with someone diagnosed with COVID-19, and your symptoms. You should receive instructions from your physician's office regarding next steps of care.  . When you arrive at healthcare provider, tell the healthcare staff immediately you have returned from visiting [place], [place], [place], [place] or [place]; or traveled in the [place] to [place], [place], [place], or [place]; in the last two weeks or you have been in close contact with a person diagnosed with COVID-19 in the last 2 weeks.   . Tell the health care staff about your symptoms: fever, cough and shortness of breath. . After you have been seen by a medical provider, you will be either: o Tested for (COVID-19) and discharged home on quarantine except to seek medical care if symptoms worsen, and asked to  - Stay home and avoid contact with others until you get your results (4-5 days)  - Avoid travel on public transportation if possible (such as bus, train, or airplane) or o Sent to the Emergency Department by EMS for evaluation, COVID-19 testing, and possible admission depending on your condition and test results.  What to do if you are LOW RISK for COVID-19?  Reduce your risk of any infection by using the same precautions used for avoiding the common cold or flu:  [name] Wash your hands often with soap and warm water for at least 20 seconds.  If soap and water are not readily available, use an alcohol-based hand sanitizer with at least 60% alcohol.  [name]  If coughing or sneezing, cover your mouth and nose by coughing or sneezing into the elbow areas of your shirt or coat, into a tissue or into your sleeve (not your hands). . Avoid shaking hands with others and consider head nods or verbal greetings only. . Avoid touching your eyes, nose, or mouth with unwashed hands.  . Avoid close contact with people who are sick. . Avoid places or events with large numbers of people in one location, like  concerts or sporting events. . Carefully consider travel plans you have or are making. . If you are planning any travel outside or inside the [place], visit the CDC's Travelers' Health webpage for the latest health notices. . If you have some symptoms but not all symptoms, continue to monitor at home and seek medical attention if your symptoms worsen. . If you are having a medical emergency, call 911.   ADDITIONAL HEALTHCARE OPTIONS FOR PATIENTS  [facility] Tele[facility] / e-Visit: [email or web address]         [facility]: [phone]  [facility]: [phone]                   [facility]: [phone]  

## 2019-08-15 NOTE — Progress Notes (Signed)
Per Dr. Waldon Reining to treat today without this am lab results

## 2019-08-15 NOTE — Telephone Encounter (Signed)
Refill request for zofran

## 2019-08-15 NOTE — Progress Notes (Signed)
[facility]   [phone] [phone]   Clinic Follow up Note   Patient Care Team: Patient, No Pcp Per as PCP - General (General Practice) Maurice Finner, RN as Oncology Nurse Navigator  Date of Service:  08/15/2019  CHIEF COMPLAINT: F/u Maurice Lowe  SUMMARY OF ONCOLOGIC HISTORY: Oncology History  Hepatocellular carcinoma ([facility])  07/25/2019 Imaging   MRI Abdomen 07/25/19 IMPRESSION: 1. Large diffusely infiltrative mass centered in the left lobe of the liver, with indeterminate but very aggressive imaging characteristics, highly concerning for infiltrative tumor considered likely hepatocellular carcinoma.   07/30/2019 Initial Diagnosis   Hepatocellular carcinoma ([facility])   08/08/2019 PET scan   PET 08/08/19 IMPRESSION: 1. Intensely hypermetabolic infiltrative mass occupying the near entirety of the LEFT hepatic lobe consistent with primary hepatic malignancy. 2. Individual discrete hypermetabolic nodules within the superior aspect of the RIGHT hepatic lobe. 3. Hypermetabolic metastatic adenopathy in the periportal lymph nodes and periaortic lymph nodes upper abdomen. 4. Hypermetabolic lymph node in the precordial space and a RIGHT lower paratracheal lymph node.   08/11/2019 Initial Biopsy   FINAL MICROSCOPIC DIAGNOSIS: 08/11/19  A. LIVER, LEFT HEPATIC LOBE, BIOPSY:  - Poorly differentiated carcinoma.  - See comment.   COMMENT:  The poorly differentiated carcinoma is positive with cytokeratin 7 and  cytokeratin 20 and there is variable positivity with HepPar 1,  Glypican-3, polyclonal CEA and MOC31.  The tumor is negative with  arginase, CDX2, cytokeratin 5/6, TTF-1 and Napsin-A.  The  immunophenotype is not specific but favors poorly differentiated  hepatocellular carcinoma.  The differential diagnosis however includes  poorly differentiated cholangiocarcinoma.    08/14/2019 Procedure   Upper Endoscopy by Dr Loletha Carrow 08/14/19 IMPRESSION - Normal  esophagus. - Normal stomach. - Normal examined duodenum. - No specimens collected.  Colonoscopy  IMPRESSION - The entire examined colon is normal on direct and retroflexion views. - No specimens collected.    08/15/2019 -  Chemotherapy   First LineTecentriq and Avastin q3weeks starting 08/15/19      CURRENT THERAPY:  First Line Tecentriq and Avastin q3weeks starting 08/15/19  INTERVAL HISTORY:  Maurice Lowe is here for first cycle chemotherapy.  He was found to have fever with temperature 102 when he checked in.  He was brought to my clinic for evaluation before chemo.  He has been having intermittent fever (almost daily) for the past months, he has been taking Advil 3-4 times a day to break down the fever.  He also has intermittent nausea, for which he takes antiemesis.  He is quite fatigued, his last dose of Advil was this morning 6:00.  He states when his fever and nausea breaks, he feels great.  But overall he has been very fatigued, with low appetite, and some abdominal pain when he has fever and nausea.    MEDICAL HISTORY:  Past Medical History:  Diagnosis Date  . Elevated liver function tests   . Hepatic cyst   . Hepatitis   . Hepatitis B   . Non Hodgkin's lymphoma [facility])    age [age]    SURGICAL HISTORY: Past Surgical History:  Procedure Laterality Date  . PORT-A-CATH REMOVAL    . PORTACATH PLACEMENT      I have reviewed the social history and family history with the patient and they are unchanged from previous note.  ALLERGIES:  is allergic to phenergan [promethazine].  MEDICATIONS:  Current Outpatient Medications  Medication Sig Dispense Refill  . entecavir (BARACLUDE) 0.5 MG tablet TAKE 1  TABLET BY MOUTH DAILY 30 tablet 2  . ondansetron (ZOFRAN) 8 MG tablet Take 1 tablet (8 mg total) by mouth every 8 (eight) hours as needed for nausea or vomiting. 45 tablet 1  . oxyCODONE (OXY IR/ROXICODONE) 5 MG immediate release tablet Take 1-2 tablets (5-10 mg  total) by mouth every 6 (six) hours as needed for severe pain. 60 tablet 0  . oxyCODONE (OXYCONTIN) 10 mg 12 hr tablet Take 1 tablet (10 mg total) by mouth every 12 (twelve) hours. (Patient not taking: Reported on 08/14/2019) 60 tablet 0   Current Facility-Administered Medications  Medication Dose Route Frequency Provider Last Rate Last Admin  . 0.9 %  sodium chloride infusion  500 mL Intravenous Once Doran Stabler, MD       Facility-Administered Medications Ordered in Other Visits  Medication Dose Route Frequency Provider Last [place]  . atezolizumab (TECENTRIQ) 1,200 mg in sodium chloride 0.9 % 250 mL chemo infusion  1,200 mg Intravenous Once Truitt Merle, MD 270 mL/hr at 08/15/19 0930 1,200 mg at 08/15/19 0930  . bevacizumab-bvzr (ZIRABEV) 900 mg in sodium chloride 0.9 % 100 mL chemo infusion  15 mg/kg (Treatment Plan Recorded) Intravenous Once Truitt Merle, MD        PHYSICAL EXAMINATION: ECOG PERFORMANCE STATUS: 2 - Symptomatic, <50% confined to bed Blood pressure 109/66, heart rate 105, respiratory rate 12, T 100.4, pulse ox 99% on room air GENERAL:alert, mild distress due to fever and sweating  SKIN: skin color, texture, turgor are normal, no rashes or significant lesions EYES: normal, Conjunctiva are pink and non-injected, sclera clear NECK: supple, thyroid normal size, non-tender, without nodularity LYMPH:  no palpable lymphadenopathy in the cervical, axillary  LUNGS: clear to auscultation and percussion with normal breathing effort HEART: regular rate & rhythm and no murmurs and no lower extremity edema ABDOMEN: soft, (+) hepatomegaly with tenderness Musculoskeletal:no cyanosis of digits and no clubbing  NEURO: alert & oriented x 3 with fluent speech, no focal motor/sensory deficits  LABORATORY DATA:  I have reviewed the data as listed CBC Latest Ref Rng & Units 08/15/2019 08/13/2019 08/11/2019  WBC 4.0 - 10.5 K/uL 9.0 11.7(H) 10.5  Hemoglobin 13.0 - 17.0 g/dL 10.8(L)  12.5(L) 12.0(L)  Hematocrit 39.0 - 52.0 % 31.8(L) 38.7(L) 37.1(L)  Platelets 150 - 400 K/uL 292 319 325     CMP Latest Ref Rng & Units 08/15/2019 08/13/2019 08/11/2019  Glucose 70 - 99 mg/dL 98 113(H) 87  BUN 6 - 20 mg/dL _0 Creatinine 0.61 - 1.24 mg/dL 0.73 0.83 0.81  Sodium 135 - 145 mmol/L 133(L) 135 135  Potassium 3.5 - 5.1 mmol/L 3.7 4.9 3.8  Chloride 98 - 111 mmol/L 101 99 96(L)  CO2 22 - 32 mmol/L _1 Calcium 8.9 - 10.3 mg/dL 8.9 12.1(H) 10.6(H)  Total Protein 6.5 - 8.1 g/dL 8.4(H) 9.2(H) 9.1(H)  Total Bilirubin 0.3 - 1.2 mg/dL 0.5 1.0 0.7  Alkaline Phos 38 - 126 U/L 103 118 102  AST 15 - 41 U/L 62(H) 56(H) 47(H)  ALT 0 - 44 U/L _2 RADIOGRAPHIC STUDIES: I have personally reviewed the radiological images as listed and agreed with the findings in the report. No results found.   ASSESSMENT & PLAN:  Maurice Lowe is a 39 y.o. male with   1.Liver mass, hepatocellular carcinomain left and right lobe, with distant node metastasis, stage IV -He has a9.8 x5.8 x10.7 cm intensely hypermetabolic  infiltrative mass occupying nearly entire left lobe, with hypermetabolic nodules in the right lobe. His mass was found to be Poorly differentiated carcinoma on 08/11/19 liver biopsy. The IHC studies favor poorly differentiated hepatocellular carcinoma, however poorly differentiated cholangiocarcinoma is not ruled out.  I discussed the above with patient today, this could be mixed histology of HCC and cholangiocarcinoma.  His clinical presentation is concerning for aggressive malignancy, not typical [place] -His staging PET from 08/08/19 also showed there are also hypermetabolic periportal, periaortic, precordial, and paratracheal nodes, consistent with nodal metastasis. This is unfortunately not curable disease -Due to the aggressive nature of his disease, I recommended first line systemic therapy with immunotherapy Tecentriq and biological therapy Avastin q3weeks for disease  control. Goal of therapy is palliative. He will start today (08/15/19).  -If he does not respond to Tecentriq and Avastin, I will switch him to chemotherapy with cisplatin and gemcitabine -will likely repeat scan in 2 month, after cycle 3  -will monitor AFP, will check CA19.9 also   3. Persistent fever, liklely tumor fever  -Began initially with diagnosis, persistent -Low grade fever spikes, 100-100.9 range. No significant chills.  -urine and blood cultures negative from 08/15/19, no evidence of pneumonia or other source of infection such as abscess from recent PET scan. -This is likely related to tumor fever -He does have slight elevated white count, and has not tried any antibiotics.  I will call in Levaquin 750 mg daily for 7 days today, and continue f/u her recent cultures    3. Hypercalcemia of malignancy -developed early this week, he is symptomatic with constipation, fatigue, weakness -He was treated with IVF and Zometa on 08/13/19.  -repeated CMP today is still pending    4.Epigastric pain, secondary to #1 -Previously managed with oxycodone 5 mg PRN, he takes 2-3 tabs per day. -his pain worsened and he takes 2 tabs q6 which is effective for about 4 hours. We discussed long acting pain management. He is interested. We reviewed potential side effects of oxycontin and he agrees to try. 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 If he takes FMLA, he will lose his job and insurance.  -His wife is starting new job soon, but her insurance may not be that good. -Has 3 sons at home ages 74, 43, 67 -Will follow closely and refer to SW if needed -not currently working due to his symptoms -Met with Stefanie Libel financial advocate today to discuss [place]   8. History of T-cellnon-hodgkins lymphoma, 1994 -s/p chemotherapy(including methotrexate)at [facility]   PLAN: -Labs reviewed and adequate to proceed with C1 Tecentriq and Avastin today  -will give more IVF if calcium still high  -I called in Levaquin 750 mg daily for 7 days, and lactulose -Lab and F/u next week for toxicity check  -Lab, F/u and Tecentriq and Avastin in 3 and 6 weeks    No problem-specific Assessment & Plan notes  found for this encounter.   No orders of the defined types were placed in this encounter.  All questions were answered. The patient knows to call the clinic with any problems, questions or concerns. No barriers to learning was detected. The total time spent in the appointment was 30 minutes.     Truitt Merle, MD 08/15/2019   I, Joslyn Devon, am acting as scribe for Truitt Merle, MD.   I have reviewed the above documentation for accuracy and completeness, and I agree with the above.

## 2019-08-18 ENCOUNTER — Telehealth: Payer: Self-pay | Admitting: *Deleted

## 2019-08-18 ENCOUNTER — Telehealth: Payer: Self-pay

## 2019-08-18 LAB — CULTURE, BLOOD (SINGLE): Culture: NO GROWTH

## 2019-08-18 NOTE — Telephone Encounter (Signed)
Left message on f/u call 

## 2019-08-18 NOTE — Telephone Encounter (Signed)
  Follow up Call-  Call back number 08/14/2019  Post procedure Call Back phone  # [phone]  Permission to leave phone message Yes  Some recent data might be hidden     Patient questions:  Do you have a fever, pain , or abdominal swelling? No. Pain Score  0 *  Have you tolerated food without any problems? Yes.    Have you been able to return to your normal activities? Yes.    Do you have any questions about your discharge instructions: Diet   No. Medications  No. Follow up visit  No.  Do you have questions or concerns about your Care? No.  Actions: * If pain score is 4 or above: No action needed, pain <4.  1. Have you developed a fever since your procedure? no  2.   Have you had an respiratory symptoms (SOB or cough) since your procedure? no  3.   Have you tested positive for COVID 19 since your procedure no  4.   Have you had any family members/close contacts diagnosed with the COVID 19 since your procedure?  no   If yes to any of these questions please route to Joylene John, RN and Erenest Rasher, RN

## 2019-08-18 NOTE — Telephone Encounter (Signed)
Called pt to see how he was doing post treatment on Friday.  He reports doing well today.  He reports fatigue first day & second day was better.  He is still dealing with constipation due to the amount of pain meds he is taking.  He reports only taking lactulose once & felt it was too much for him.  He takes 2 ducolax every am & miralax TID.  Suggested taking a stool softener with every pain pill.  He reports zofran works great & he is eating frequent small meals.  He is concerned about a raspy voice.  Informed to watch & call if this is worse.  He is not having trouble breathing or swallowing.  Reported to MD.

## 2019-08-18 NOTE — Telephone Encounter (Signed)
-----   Message from Jesse Fall, [place] sent at 08/15/2019  4:09 PM EDT ----- Regarding: FW: Dr. Burr Medico Treatment 3/26--Call on Monday 08/18/19 ----- Message ----- From: Lester [place], RN Sent: 08/15/2019  11:54 AM EDT To: Onc Triage Nurse [facility] Subject: Dr. Burr Medico                                       Pt. received first time Tencentriq and Bevacizumab and tolerated well.

## 2019-08-19 ENCOUNTER — Encounter: Payer: Self-pay | Admitting: Nurse Practitioner

## 2019-08-20 ENCOUNTER — Encounter: Payer: Self-pay | Admitting: Hematology

## 2019-08-20 ENCOUNTER — Encounter: Payer: Self-pay | Admitting: General Practice

## 2019-08-20 ENCOUNTER — Other Ambulatory Visit: Payer: Self-pay

## 2019-08-20 ENCOUNTER — Inpatient Hospital Stay: Payer: BC Managed Care – PPO

## 2019-08-20 ENCOUNTER — Inpatient Hospital Stay (HOSPITAL_BASED_OUTPATIENT_CLINIC_OR_DEPARTMENT_OTHER): Payer: BC Managed Care – PPO | Admitting: Medical

## 2019-08-20 VITALS — BP 110/75 | HR 62 | Temp 98.2°F | Resp 16 | Ht 66.0 in | Wt 123.4 lb

## 2019-08-20 DIAGNOSIS — R1013 Epigastric pain: Secondary | ICD-10-CM

## 2019-08-20 DIAGNOSIS — Z79899 Other long term (current) drug therapy: Secondary | ICD-10-CM | POA: Diagnosis not present

## 2019-08-20 DIAGNOSIS — C779 Secondary and unspecified malignant neoplasm of lymph node, unspecified: Secondary | ICD-10-CM | POA: Diagnosis not present

## 2019-08-20 DIAGNOSIS — C220 Liver cell carcinoma: Secondary | ICD-10-CM

## 2019-08-20 DIAGNOSIS — Z8572 Personal history of non-Hodgkin lymphomas: Secondary | ICD-10-CM | POA: Diagnosis not present

## 2019-08-20 DIAGNOSIS — K7460 Unspecified cirrhosis of liver: Secondary | ICD-10-CM | POA: Diagnosis not present

## 2019-08-20 DIAGNOSIS — Z9221 Personal history of antineoplastic chemotherapy: Secondary | ICD-10-CM | POA: Diagnosis not present

## 2019-08-20 DIAGNOSIS — K7469 Other cirrhosis of liver: Secondary | ICD-10-CM | POA: Diagnosis not present

## 2019-08-20 DIAGNOSIS — G893 Neoplasm related pain (acute) (chronic): Secondary | ICD-10-CM | POA: Diagnosis not present

## 2019-08-20 DIAGNOSIS — B181 Chronic viral hepatitis B without delta-agent: Secondary | ICD-10-CM | POA: Diagnosis not present

## 2019-08-20 DIAGNOSIS — R7989 Other specified abnormal findings of blood chemistry: Secondary | ICD-10-CM

## 2019-08-20 DIAGNOSIS — R112 Nausea with vomiting, unspecified: Secondary | ICD-10-CM

## 2019-08-20 LAB — CMP (CANCER CENTER ONLY)
ALT: 20 U/L (ref 0–44)
AST: 73 U/L — ABNORMAL HIGH (ref 15–41)
Albumin: 2.9 g/dL — ABNORMAL LOW (ref 3.5–5.0)
Alkaline Phosphatase: 139 U/L — ABNORMAL HIGH (ref 38–126)
Anion gap: 14 (ref 5–15)
BUN: 5 mg/dL — ABNORMAL LOW (ref 6–20)
CO2: 20 mmol/L — ABNORMAL LOW (ref 22–32)
Calcium: 8 mg/dL — ABNORMAL LOW (ref 8.9–10.3)
Chloride: 101 mmol/L (ref 98–111)
Creatinine: 0.82 mg/dL (ref 0.61–1.24)
GFR, Est AFR Am: >60 mL/min (ref >60)
GFR, Estimated: >60 mL/min (ref >60)
Glucose, Bld: 87 mg/dL (ref 70–99)
Potassium: 4.3 mmol/L (ref 3.5–5.1)
Sodium: 135 mmol/L (ref 135–145)
Total Bilirubin: 0.4 mg/dL (ref 0.3–1.2)
Total Protein: 9.8 g/dL — ABNORMAL HIGH (ref 6.5–8.1)

## 2019-08-20 LAB — CBC WITH DIFFERENTIAL (CANCER CENTER ONLY)
Abs Immature Granulocytes: 0.04 10*3/uL (ref 0.00–0.07)
Basophils Absolute: 0 10*3/uL (ref 0.0–0.1)
Basophils Relative: 0 %
Eosinophils Absolute: 0 10*3/uL (ref 0.0–0.5)
Eosinophils Relative: 0 %
HCT: 41.6 % (ref 39.0–52.0)
Hemoglobin: 13.5 g/dL (ref 13.0–17.0)
Immature Granulocytes: 0 %
Lymphocytes Relative: 21 %
Lymphs Abs: 2 10*3/uL (ref 0.7–4.0)
MCH: 27.6 pg (ref 26.0–34.0)
MCHC: 32.5 g/dL (ref 30.0–36.0)
MCV: 85.1 fL (ref 80.0–100.0)
Monocytes Absolute: 0.8 10*3/uL (ref 0.1–1.0)
Monocytes Relative: 9 %
Neutro Abs: 6.8 10*3/uL (ref 1.7–7.7)
Neutrophils Relative %: 70 %
Platelet Count: 342 10*3/uL (ref 150–400)
RBC: 4.89 MIL/uL (ref 4.22–5.81)
RDW: 13.4 % (ref 11.5–15.5)
WBC Count: 9.6 10*3/uL (ref 4.0–10.5)
nRBC: 0 % (ref 0.0–0.2)

## 2019-08-20 LAB — MAGNESIUM: Magnesium: 2.5 mg/dL — ABNORMAL HIGH (ref 1.7–2.4)

## 2019-08-20 MED ORDER — ONDANSETRON HCL 4 MG/2ML IJ SOLN
8.0000 mg | Freq: Once | INTRAMUSCULAR | Status: AC
  Administered 2019-08-20: 8 mg via INTRAVENOUS

## 2019-08-20 MED ORDER — ONDANSETRON HCL 4 MG/2ML IJ SOLN
INTRAMUSCULAR | Status: AC
  Filled 2019-08-20: qty 4

## 2019-08-20 MED ORDER — MORPHINE SULFATE 2 MG/ML IJ SOLN
2.0000 mg | Freq: Once | INTRAMUSCULAR | Status: AC
  Administered 2019-08-20: 2 mg via INTRAVENOUS
  Filled 2019-08-20: qty 1

## 2019-08-20 MED ORDER — SODIUM CHLORIDE 0.9 % IV SOLN
Freq: Once | INTRAVENOUS | Status: AC
  Filled 2019-08-20: qty 250

## 2019-08-20 MED ORDER — MORPHINE SULFATE (PF) 4 MG/ML IV SOLN
INTRAVENOUS | Status: AC
  Filled 2019-08-20: qty 1

## 2019-08-20 MED ORDER — ONDANSETRON HCL 8 MG PO TABS: 8.0000 mg | ORAL_TABLET | Freq: Three times a day (TID) | ORAL | 1 refills | Status: AC | PRN

## 2019-08-20 MED ORDER — MORPHINE SULFATE 4 MG/ML IJ SOLN
2.0000 mg | Freq: Once | INTRAMUSCULAR | Status: AC
  Administered 2019-08-20: 2 mg via INTRAVENOUS
  Filled 2019-08-20: qty 1

## 2019-08-20 MED ORDER — METOCLOPRAMIDE HCL 5 MG PO TABS: 5.0000 mg | ORAL_TABLET | Freq: Three times a day (TID) | ORAL | 2 refills | Status: DC

## 2019-08-20 NOTE — Progress Notes (Signed)
[facility] CSW Progress Notes  Call from Valda Favia, nurse navigator - patient needs help w affording new medications from pharmacy.  Asks for [employer] gift card as patient has not been approved for [employer] yet.  CSW Wallis Bamberg provided first disbursement of $50.00 to patient - can get a total of $200 from this funding source.  Edwyna Shell, LCSW Clinical Social Worker Phone:  [phone] Cell:  [phone]

## 2019-08-20 NOTE — Progress Notes (Signed)
Spoke with [place] regarding financial assistance with patient's medications. She states he needs to call her and provide information she has asked for and they can set him up for grant at [facility] OP.  Spoke with Edwyna Shell CSW regarding Visa cards to help with costs at present. Gwinda [place] CSW came up and provided the patient with these.

## 2019-08-20 NOTE — Progress Notes (Signed)
[facility] Progress Note   Rorry Tawes KW:[id number] Jul 03, 1980 39 y.o.  Goro Showell is managed by Truitt Merle, MD  Actively treated with chemotherapy/immunotherapy/hormonal therapy: yes  Current therapy: Tecentriq and Noah Charon  Last treated: 08/15/2019 (cycle #1, day #1)  Next scheduled appointment with provider: 08/21/2019  Assessment: Plan:    Hepatocellular carcinoma ([place]) - Plan: CBC with Differential ([place] Only), CMP ([place] only), Magnesium  Abdominal pain, epigastric - Plan: CBC with Differential ([place] Only), CMP ([place] only), Magnesium, morphine 4 MG/ML injection 2 mg, 0.9 %  sodium chloride infusion, morphine 4 MG/ML injection, morphine 2 MG/ML injection 2 mg, morphine 4 MG/ML injection  Elevated LFTs - Plan: CMP ([place] only)  Non-intractable vomiting with nausea, unspecified vomiting type - Plan: CBC with Differential ([place] Only), CMP ([place] only), Magnesium, ondansetron (ZOFRAN) injection 8 mg, 0.9 %  sodium chloride infusion, ondansetron (ZOFRAN) 4 MG/2ML injection, ondansetron (ZOFRAN) 8 MG tablet, metoCLOPramide (REGLAN) 5 MG tablet   Liver mass, hepatocellular carcinomain left and right lobe, with distant node metastasis, stage IV: Mr. Paolicelli continues to be managed by Dr. Burr Medico and is status post cycle 1, day 1 of Tecentriq and [place] which was dosed on 08/15/2019.  He is scheduled to be seen in follow-up on 0/05/2019.  Epigastric pain: Mr. Bellaire continues on OxyIR 1 to 2 tablets every 6 hours as needed for pain.  Additionally he continues on OxyContin 10 mg every 12 hours.  He was told to continue oxycodone Contin ER 10 mg every 12 hours.  Additionally he was told that he could increase his dosing of OxyIR to 1 tablet every 4 hours.  Additionally he will begin naproxen 250 mg p.o. twice daily for possible hepatic capsular pain.  Mr. Massengill was given morphine sulfate 2 mg IV x2.  Nausea:  He was given Zofran 8 mg IV x1.  Also, his oral Zofran was refilled and he was given a prescription for Reglan 5 mg p.o. 3 times daily to 4 times daily.  Please see After Visit Summary for patient specific instructions.  Future Appointments  Date Time Provider [place]  08/21/2019  8:15 AM Alla Feeling, NP [facility] None  09/04/2019 10:00 AM [facility] LAB 5 [facility] None  09/04/2019 10:40 AM Truitt Merle, MD [facility] None  09/04/2019 11:30 AM [facility] INFUSION [facility] None  09/25/2019  8:45 AM [facility] LAB 5 [facility] None  09/25/2019  9:15 AM Alla Feeling, NP [facility] None  09/25/2019 10:00 AM [facility] INFUSION [facility] None    Orders Placed This Encounter  Procedures  . CBC with Differential ([place] Only)  . CMP ([place] only)  . Magnesium       Subjective:   Patient ID:  Maxence Laprairie is a 39 y.o. (DOB 06/12/1980) male.  Chief Complaint:  Chief Complaint  Patient presents with  . Pain    Abdominal    HPI Felton Garnet  is a 39 y.o. 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  He is status post cycle 1, day 1 of Tecentriq and [place] which was dosed on 08/15/2019.  He presents to the clinic today with ongoing epigastric pain.  He is currently using OxyIR 1 to 2 tablets daily 4 to 6 hours as needed for pain.  He continues on OxyContin extended release 10 mg every 12 hours. He decreased his  use of Oxy IR 5 mg not because his pain was better but rather because he was concerned about taking both the short and long acting agents together. He has been experiencing nausea and vomiting. His wife reports that he has been vomiting partially digested foods. His fevers have improved while he has been on Levaquin. He has one dose left to take.  Medications: I have reviewed the patient's current medications.  Allergies:  Allergies  Allergen  Reactions  . Phenergan [Promethazine] Other (See Comments)    Hyperactivity and agitation    Past Medical History:  Diagnosis Date  . Elevated liver function tests   . Hepatic cyst   . Hepatitis   . Hepatitis B   . Non Hodgkin's lymphoma [facility])    age 16-15    Past Surgical History:  Procedure Laterality Date  . PORT-A-CATH REMOVAL    . PORTACATH PLACEMENT      Family History  Problem Relation Age of Onset  . Heart disease Mother   . Stroke Paternal Grandmother   . Hepatitis Neg Hx   . Liver disease Neg Hx   . Colon cancer Neg Hx   . Esophageal cancer Neg Hx   . Stomach cancer Neg Hx   . Pancreatic cancer Neg Hx   . Colon polyps Neg Hx   . Rectal cancer Neg Hx     Social History   Socioeconomic History  . Marital status: Married    Spouse name: Not on file  . Number of children: 3  . Years of education: Not on file  . Highest education level: Not on file  Occupational History  . Occupation: help desk IT  Tobacco Use  . Smoking status: Former Research scientist (life sciences)  . Smokeless tobacco: Never Used  Substance and Sexual Activity  . Alcohol use: Not Currently    Comment: none since admitted to hospital 12/09/15 when starting Hep B treatment, moderate before quitting   . Drug use: Not Currently    Types: Marijuana    Comment: smoked marijuana daily from 73 to age 51, none currently   . Sexual activity: Not on file  Other Topics Concern  . Not on file  Social History Narrative  . Not on file   Social Determinants of Health   Financial Resource Strain:   . Difficulty of Paying Living Expenses:   Food Insecurity:   . Worried About Charity fundraiser in the Last Year:   . Arboriculturist in the Last Year:   Transportation Needs:   . Film/video editor (Medical):   [name] Lack of Transportation (Non-Medical):   Physical Activity:   . Days of Exercise per Week:   . Minutes of Exercise per Session:   Stress:   . Feeling of Stress :   Social Connections:   . Frequency of  Communication with Friends and Family:   . Frequency of Social Gatherings with Friends and Family:   . Attends Religious Services:   . Active Member of Clubs or Organizations:   . Attends Archivist Meetings:   [name] Marital Status:   Intimate Partner Violence:   . Fear of Current or Ex-Partner:   . Emotionally Abused:   [name] Physically Abused:   . Sexually Abused:     Past Medical History, Surgical history, Social history, and Family history were reviewed and updated as appropriate.   Please see review of systems for further details on the patient's review from today.   Review of  Systems:  Review of Systems  Constitutional: Positive for fatigue. Negative for activity change, appetite change, chills, diaphoresis and fever.  HENT: Negative for trouble swallowing.   Respiratory: Negative for cough, chest tightness and shortness of breath.   Cardiovascular: Negative for chest pain, palpitations and leg swelling.  Gastrointestinal: Positive for abdominal pain, nausea and vomiting. Negative for abdominal distention, constipation and diarrhea.    Objective:   Physical Exam:  BP 110/75 (BP Location: Left Arm, Patient Position: Sitting)   Pulse 62   Temp 98.2 F (36.8 C) (Temporal)   Resp 16   Ht 5\' 6"  (1.676 m)   Wt 123 lb 6.4 oz (56 kg)   SpO2 100%   BMI 19.92 kg/m  ECOG: 0  Physical Exam Constitutional:      General: He is not in acute distress.    Appearance: He is not diaphoretic.  HENT:     Head: Normocephalic and atraumatic.  Eyes:     General: No scleral icterus.       Right eye: No discharge.        Left eye: No discharge.     Conjunctiva/sclera: Conjunctivae normal.  Cardiovascular:     Rate and Rhythm: Normal rate and regular rhythm.     Heart sounds: Normal heart sounds. No murmur. No friction rub. No gallop.   Pulmonary:     Effort: Pulmonary effort is normal. No respiratory distress.     Breath sounds: Normal breath sounds. No wheezing or rales.    Abdominal:     General: Abdomen is flat. Bowel sounds are normal.     Palpations: Abdomen is soft.  Skin:    General: Skin is warm and dry.     Findings: No erythema or rash.  Neurological:     Mental Status: He is alert.     Gait: Gait abnormal (The patient is ambulating with the use of a wheelchair.).  Psychiatric:        Mood and Affect: Mood normal.        Behavior: Behavior normal.        Thought Content: Thought content normal.        Judgment: Judgment normal.     Lab Review:     Component Value Date/Time   NA 135 08/20/2019 1115   K 4.3 08/20/2019 1115   CL 101 08/20/2019 1115   CO2 20 (L) 08/20/2019 1115   GLUCOSE 87 08/20/2019 1115   BUN 5 (L) 08/20/2019 1115   CREATININE 0.82 08/20/2019 1115   CALCIUM 8.0 (L) 08/20/2019 1115   PROT 9.8 (H) 08/20/2019 1115   ALBUMIN 2.9 (L) 08/20/2019 1115   AST 73 (H) 08/20/2019 1115   ALT 20 08/20/2019 1115   ALKPHOS 139 (H) 08/20/2019 1115   BILITOT 0.4 08/20/2019 1115   GFRNONAA >60 08/20/2019 1115   GFRAA >60 08/20/2019 1115       Component Value Date/Time   WBC 9.6 08/20/2019 1115   WBC 10.5 08/11/2019 1146   RBC 4.89 08/20/2019 1115   HGB 13.5 08/20/2019 1115   HCT 41.6 08/20/2019 1115   PLT 342 08/20/2019 1115   MCV 85.1 08/20/2019 1115   MCH 27.6 08/20/2019 1115   MCHC 32.5 08/20/2019 1115   RDW 13.4 08/20/2019 1115   LYMPHSABS 2.0 08/20/2019 1115   MONOABS 0.8 08/20/2019 1115   EOSABS 0.0 08/20/2019 1115   BASOSABS 0.0 08/20/2019 1115   -------------------------------  Imaging from last 24 hours (if applicable):  Radiology interpretation: MR Abdomen  W Wo Contrast  Result Date: 07/26/2019 CLINICAL DATA:  39 year old male with history of chronic hepatitis B infection. Epigastric pain. EXAM: MRI ABDOMEN WITHOUT AND WITH CONTRAST TECHNIQUE: Multiplanar multisequence MR imaging of the abdomen was performed both before and after the administration of intravenous contrast. CONTRAST:  74mL GADAVIST GADOBUTROL  1 MMOL/ML IV SOLN COMPARISON:  No priors. FINDINGS: Lower chest: Unremarkable. Hepatobiliary: Liver has a nodular contour, indicative of cirrhosis. Throughout the left lobe of the liver there is a poorly defined infiltrative mass which is best appreciated on fat saturated T2 weighted images where this region measures up to approximately 9.8 x 5.8 x 10.7 cm (axial image 17 of series 4 and coronal image 6 of series 3) involving portions of segments 1, 2, 3, 4A and 4B. This area is T1 hypointense, T2 hyperintense and demonstrates variable degrees of internal enhancement on post gadolinium imaging. Much of this region demonstrates diffusion restriction. Innumerable other small 2-3 mm T1 hypointense, T2 isointense nonenhancing lesions are noted elsewhere throughout the right lobe of the liver as well, presumably small regenerative micronodules. No intra or extrahepatic biliary ductal dilatation. Gallbladder is normal in appearance. Pancreas: No pancreatic mass. No pancreatic ductal dilatation. No pancreatic or peripancreatic fluid collections or inflammatory changes. Spleen:  Unremarkable. Adrenals/Urinary Tract: Bilateral kidneys and adrenal glands are normal in appearance. No hydroureteronephrosis. Stomach/Bowel: Visualized portions are unremarkable. Vascular/Lymphatic: No aneurysm identified in the visualized abdominal vasculature. No lymphadenopathy noted in the abdomen. Other: No significant volume of ascites noted in the visualized portions of the peritoneal cavity. Musculoskeletal: No aggressive appearing osseous lesions are noted in the visualized portions of the skeleton. IMPRESSION: 1. Large diffusely infiltrative mass centered in the left lobe of the liver, with indeterminate but very aggressive imaging characteristics, highly concerning for infiltrative tumor considered likely hepatocellular carcinoma. These results will be called to the ordering clinician or representative by the Radiologist Assistant, and  communication documented in the PACS or zVision Dashboard. Electronically Signed   By: Vinnie Langton M.D.   On: 07/26/2019 05:51   NM PET Image Initial (PI) Skull Base To Thigh  Result Date: 08/08/2019 CLINICAL DATA:  Initial treatment strategy for hepatocellular carcinoma. Chronic hepatitis B. EXAM: NUCLEAR MEDICINE PET SKULL BASE TO THIGH TECHNIQUE: 6.4 mCi F-18 FDG was injected intravenously. Full-ring PET imaging was performed from the skull base to thigh after the radiotracer. CT data was obtained and used for attenuation correction and anatomic localization. Fasting blood glucose: 104 mg/dl COMPARISON:  Abdominal MRI 07/25/2019 FINDINGS: Mediastinal blood pool activity: SUV max 1.91 Liver activity: SUV max 2.4 NECK: No hypermetabolic lymph nodes in the neck. Symmetric hypermetabolic activity in the lingual tonsils is favored physiologic. Incidental CT findings: none CHEST: Single small hypermetabolic RIGHT lower paratracheal lymph node with SUV max equal 3.6. This lymph node measures only 5 mm short axis (image 63/4). Hypermetabolic nodule anterior to the RIGHT ventricle in the precordial space with SUV max equal 16.3. Nodule measures 12 mm (image 89/4) No suspicious pulmonary nodules. Incidental CT findings: none ABDOMEN/PELVIS: Large hypermetabolic infiltrative mass involving the near entirety of the LEFT hepatic lobe. Confluent hypermetabolic infiltrative mass with SUV max equal 15.7. There is more discrete individual lesions in the RIGHT hepatic lobe dome SUV max equal 9.4. Enlarged hypermetabolic periportal lymph nodes. These are difficult to define on the noncontrast CT but measures approximately 1.3 cm (image 144/4) with SUV max 17.9. Hypermetabolic lymph node position between the aorta and IVC is only seen on the PET imaging with  SUV max equal 4.5. No abnormality the pancreas identified. No discrete pelvic lymphadenopathy. Incidental CT findings: none SKELETON: No focal hypermetabolic activity  to suggest skeletal metastasis. Incidental CT findings: none IMPRESSION: 1. Intensely hypermetabolic infiltrative mass occupying the near entirety of the LEFT hepatic lobe consistent with primary hepatic malignancy. 2. Individual discrete hypermetabolic nodules within the superior aspect of the RIGHT hepatic lobe. 3. Hypermetabolic metastatic adenopathy in the periportal lymph nodes and periaortic lymph nodes upper abdomen. 4. Hypermetabolic lymph node in the precordial space and a RIGHT lower paratracheal lymph node. Electronically Signed   By: Suzy Bouchard M.D.   On: 08/08/2019 10:32   US BIOPSY (LIVER)  Result Date: 08/11/2019 INDICATION: 39 year old with history of hepatitis-B and infiltrative lesion involving the left hepatic lobe. Tissue diagnosis is needed. Remote history of non-Hodgkin's lymphoma. EXAM: ULTRASOUND-GUIDED LIVER LESION BIOPSY MEDICATIONS: None. ANESTHESIA/SEDATION: Moderate (conscious) sedation was employed during this procedure. A total of Versed 2.0 mg and Fentanyl 100 mcg was administered intravenously. Moderate Sedation Time: 10 minutes. The patient's level of consciousness and vital signs were monitored continuously by radiology nursing throughout the procedure under my direct supervision. FLUOROSCOPY TIME:  None COMPLICATIONS: None immediate. PROCEDURE: Informed written consent was obtained from the patient after a thorough discussion of the procedural risks, benefits and alternatives. All questions were addressed. Maximal Sterile Barrier Technique was utilized including caps, mask, sterile gowns, sterile gloves, sterile drape, hand hygiene and skin antiseptic. A timeout was performed prior to the initiation of the procedure. Liver was evaluated with ultrasound. Anterior abdomen was prepped with chlorhexidine and sterile field was created. Skin and soft tissues were anesthetized with 1% lidocaine. Using ultrasound guidance, 17 gauge coaxial needle was directed into the left  hepatic lobe. Total of 3 core biopsies were obtained with an 18 gauge core device. Specimens placed in formalin. Gel-Foam slurry was injected through the 17 gauge needle as it was removed. Bandage placed over the puncture site. FINDINGS: Left hepatic lobe is diffusely heterogeneous and evidence for poorly defined hypoechoic lesions. Three core biopsies were obtained in this area of heterogeneity. No significant bleeding or hematoma formation following the core biopsies. IMPRESSION: Ultrasound-guided core biopsies of the left hepatic lobe and infiltrative lesion. Electronically Signed   By: Markus Daft M.D.   On: 08/11/2019 14:58

## 2019-08-20 NOTE — Progress Notes (Signed)
Pt received 1L IVF NS, IV zofran, and IV morphine.  Tolerated well.  VSS.  Able to eat and drink some during infusion and denies nausea at time of d/c.  Wife present for visit today.  Denies any questions/concerns at time of d/c.

## 2019-08-20 NOTE — Patient Instructions (Signed)

## 2019-08-21 ENCOUNTER — Inpatient Hospital Stay: Payer: BC Managed Care – PPO | Attending: Nurse Practitioner | Admitting: Nurse Practitioner

## 2019-08-21 ENCOUNTER — Other Ambulatory Visit: Payer: Self-pay

## 2019-08-21 ENCOUNTER — Encounter: Payer: Self-pay | Admitting: Nurse Practitioner

## 2019-08-21 ENCOUNTER — Ambulatory Visit (HOSPITAL_COMMUNITY)
Admission: RE | Admit: 2019-08-21 | Discharge: 2019-08-21 | Disposition: A | Discharge status: Home / Self Care | Payer: BC Managed Care – PPO | Source: Ambulatory Visit | Attending: Nurse Practitioner | Admitting: Nurse Practitioner

## 2019-08-21 ENCOUNTER — Inpatient Hospital Stay: Payer: BC Managed Care – PPO

## 2019-08-21 VITALS — BP 117/80 | HR 73 | Temp 98.6°F | Resp 18 | Wt 123.7 lb

## 2019-08-21 DIAGNOSIS — R110 Nausea: Secondary | ICD-10-CM | POA: Diagnosis not present

## 2019-08-21 DIAGNOSIS — R079 Chest pain, unspecified: Secondary | ICD-10-CM | POA: Diagnosis not present

## 2019-08-21 DIAGNOSIS — R109 Unspecified abdominal pain: Secondary | ICD-10-CM | POA: Insufficient documentation

## 2019-08-21 DIAGNOSIS — K5903 Drug induced constipation: Secondary | ICD-10-CM | POA: Diagnosis not present

## 2019-08-21 DIAGNOSIS — R1013 Epigastric pain: Secondary | ICD-10-CM

## 2019-08-21 DIAGNOSIS — T402X5A Adverse effect of other opioids, initial encounter: Secondary | ICD-10-CM | POA: Diagnosis not present

## 2019-08-21 DIAGNOSIS — C220 Liver cell carcinoma: Secondary | ICD-10-CM | POA: Diagnosis not present

## 2019-08-21 DIAGNOSIS — C787 Secondary malignant neoplasm of liver and intrahepatic bile duct: Secondary | ICD-10-CM | POA: Diagnosis not present

## 2019-08-21 DIAGNOSIS — R509 Fever, unspecified: Secondary | ICD-10-CM | POA: Insufficient documentation

## 2019-08-21 DIAGNOSIS — R634 Abnormal weight loss: Secondary | ICD-10-CM | POA: Insufficient documentation

## 2019-08-21 DIAGNOSIS — Z79899 Other long term (current) drug therapy: Secondary | ICD-10-CM | POA: Diagnosis not present

## 2019-08-21 MED ORDER — SODIUM CHLORIDE 0.9 % IV SOLN
Freq: Once | INTRAVENOUS | Status: AC
  Filled 2019-08-21: qty 250

## 2019-08-21 MED ORDER — MORPHINE SULFATE (PF) 4 MG/ML IV SOLN
INTRAVENOUS | Status: AC
  Filled 2019-08-21: qty 1

## 2019-08-21 MED ORDER — DULOXETINE HCL 20 MG PO CPEP: 20.0000 mg | ORAL_CAPSULE | Freq: Every day | ORAL | 0 refills | Status: DC

## 2019-08-21 MED ORDER — MORPHINE SULFATE 2 MG/ML IJ SOLN
2.0000 mg | INTRAMUSCULAR | Status: DC | PRN
  Administered 2019-08-21: 2 mg via INTRAVENOUS
  Filled 2019-08-21: qty 1

## 2019-08-21 MED ORDER — OXYCODONE HCL ER 10 MG PO T12A: 10.0000 mg | EXTENDED_RELEASE_TABLET | Freq: Three times a day (TID) | ORAL | 0 refills | Status: DC

## 2019-08-21 NOTE — Progress Notes (Addendum)
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 Large diffusely infiltrative mass centered in the left lobe of the liver, with indeterminate but very aggressive imaging characteristics, highly concerning for infiltrative tumor considered likely hepatocellular carcinoma.   07/30/2019 Initial Diagnosis   Hepatocellular carcinoma ([place])   08/08/2019 PET scan   PET 08/08/19 IMPRESSION: 1. Intensely hypermetabolic infiltrative mass occupying the near entirety of the LEFT hepatic lobe consistent with primary hepatic malignancy. 2. Individual discrete hypermetabolic nodules within the superior aspect of the RIGHT hepatic lobe. 3. Hypermetabolic metastatic adenopathy in the periportal lymph nodes and periaortic lymph nodes upper abdomen. 4. Hypermetabolic lymph node in the precordial space and a RIGHT lower paratracheal lymph node.   08/11/2019 Initial Biopsy   FINAL MICROSCOPIC DIAGNOSIS: 08/11/19  A. LIVER, LEFT HEPATIC LOBE, BIOPSY:  - Poorly differentiated carcinoma.  - See comment.   COMMENT:  The poorly differentiated carcinoma is positive with cytokeratin 7 and  cytokeratin 20 and there is variable positivity with HepPar 1,  Glypican-3, polyclonal CEA and MOC31.  The tumor is negative with  arginase, CDX2, cytokeratin 5/6, TTF-1 and Napsin-A.  The  immunophenotype is not specific but favors poorly differentiated  hepatocellular carcinoma.  The differential diagnosis however includes  poorly differentiated cholangiocarcinoma.    08/14/2019 Procedure   Upper Endoscopy by Dr Loletha Carrow  08/14/19 IMPRESSION - Normal esophagus. - Normal stomach. - Normal examined duodenum. - No specimens collected.  Colonoscopy  IMPRESSION - The entire examined colon is normal on direct and retroflexion views. - No specimens collected.    08/15/2019 -  Chemotherapy   First LineTecentriq and Avastin q3weeks starting 08/15/19     CURRENT THERAPY: First Line Tecentriq and Avastin q3weeks starting 08/15/19  INTERVAL HISTORY: The initial part of this visit was conducted virtually, he reports the first few days after cycle 1 were "fine." No significant issues or bleeding. During the conversation it became clear he needed in person evaluation and he was brought in to clinic.   Mr. Duley was seen yesterday in [facility] for abdominal pain and nausea. He was given IVF, zofran, and 2 mg IV morphine. His pain improved and he was discharged home. He had an "awful night" as soon as morphine wore off. He continues oxycontin q12 which he started on 3/29 and oxy IR 5 mg q4 hours. He is concerned about the amount of pain meds he's taking. In addition to his epigastric pain, he had left chest and arm pain briefly. He could not get comfortable all night, woke up at one point in excruciating pain unable to move. He notes he had to "pant like a dog" because regular breathing worsens his pain. He has dry mouth but drinking adequately water, juice, and ginger ale. He is eating very little, he attributes to intermittent nausea. Reglan and zofran are helping. Bowels are moving "fine." He completed levaquin and fevers resolved.   In the office, he presents in a wheelchair. His mother is with him. He rates his pain 4/10 currently. He is OK, but feeling apprehensive about his pain tonight. Nights are worse. The oxycontin starts to wear off around 9  hours. The oxy IR 5 mg q4 is not enough. He questions whether a day of vomiting while he was on Levaqin may have exacerbated his pain. He has pain in his left upper abdomen and in his  ribs, pain is worse on deep inspiration.    MEDICAL HISTORY:  Past Medical History:  Diagnosis Date  . Elevated liver function tests   . Hepatic cyst   . Hepatitis   . Hepatitis B   . Non Hodgkin's lymphoma [facility])    age 39-40    SURGICAL HISTORY: Past Surgical History:  Procedure Laterality Date  . PORT-A-CATH REMOVAL    . PORTACATH PLACEMENT      I have reviewed the social history and family history with the patient and they are unchanged from previous note.  ALLERGIES:  is allergic to phenergan [promethazine].  MEDICATIONS:  Current Outpatient Medications  Medication Sig Dispense Refill  . entecavir (BARACLUDE) 0.5 MG tablet TAKE 1 TABLET BY MOUTH DAILY 30 tablet 2  . Lactulose 20 GM/30ML SOLN Take 30 mLs (20 g total) by mouth every 4 (four) hours as needed (for severe constipation). 450 mL 1  . levofloxacin (LEVAQUIN) 750 MG tablet Take 1 tablet (750 mg total) by mouth daily. 7 tablet 0  . metoCLOPramide (REGLAN) 5 MG tablet Take 1 tablet (5 mg total) by mouth 4 (four) times daily -  before meals and at bedtime. 160 tablet 2  . ondansetron (ZOFRAN) 8 MG tablet Take 1 tablet (8 mg total) by mouth every 8 (eight) hours as needed for nausea or vomiting. 45 tablet 1  . oxyCODONE (OXY IR/ROXICODONE) 5 MG immediate release tablet Take 1-2 tablets (5-10 mg total) by mouth every 6 (six) hours as needed for severe pain. 60 tablet 0  . oxyCODONE (OXYCONTIN) 10 mg 12 hr tablet Take 1 tablet (10 mg total) by mouth every 8 (eight) hours. 90 tablet 0  . DULoxetine (CYMBALTA) 20 MG capsule Take 1 capsule (20 mg total) by mouth daily. 30 capsule 0   Current Facility-Administered Medications  Medication Dose Route Frequency Provider Last Rate Last Admin  . 0.9 %  sodium chloride infusion  500 mL Intravenous Once Nelida Meuse III, MD      . morphine 2 MG/ML injection 2 mg  2 mg Intravenous Q2H PRN Alla Feeling, NP   2 mg at 08/21/19 1224    PHYSICAL EXAMINATION:  Vitals:    08/21/19 1014 08/21/19 1342  BP: 109/69 117/80  Pulse: 73   Resp: 18   Temp: 98.6 F (37 C)   SpO2: 100%    Filed Weights   08/21/19 1015  Weight: 123 lb 11.2 oz (56.1 kg)    GENERAL: awake but drowsy, uncomfortable but in no distress SKIN: no rash or bruising  EYES:  sclera clear LUNGS: clear with normal breathing effort HEART: regular rate & rhythm, no lower extremity edema ABDOMEN: abdomen soft, flat and diffusely tender especially in RUQ, epigastrium. There is costochondral tenderness bilaterally in the low ribs.  NEURO: alert & oriented x 3 with fluent speech, mild weakness  LABORATORY DATA:  I have reviewed the data as listed CBC Latest Ref Rng & Units 08/20/2019 08/15/2019 08/13/2019  WBC 4.0 - 10.5 K/uL 9.6 9.0 11.7(H)  Hemoglobin 13.0 - 17.0 g/dL 13.5 10.8(L) 12.5(L)  Hematocrit 39.0 - 52.0 % 41.6 31.8(L) 38.7(L)  Platelets 150 - 400 K/uL 342 292 319     CMP Latest Ref Rng & Units 08/20/2019 08/15/2019 08/13/2019  Glucose 70 - 99 mg/dL 87 98 113(H)  BUN 6 - 20 mg/dL 5(L) 6 6  Creatinine 0.61 - 1.24 mg/dL 0.82 0.73 0.83  Sodium 135 - 145 mmol/L 135 133(L) 135  Potassium 3.5 - 5.1 mmol/L 4.3 3.7 4.9  Chloride 98 - 111 mmol/L 101 101 99  CO2 22 - 32 mmol/L 20(L) 22 27  Calcium 8.9 - 10.3 mg/dL 8.0(L) 8.9 12.1(H)  Total Protein 6.5 - 8.1 g/dL 9.8(H) 8.4(H) 9.2(H)  Total Bilirubin 0.3 - 1.2 mg/dL 0.4 0.5 1.0  Alkaline Phos 38 - 126 U/L 139(H) 103 118  AST 15 - 41 U/L 73(H) 62(H) 56(H)  ALT 0 - 44 U/L _0 RADIOGRAPHIC STUDIES: I have personally reviewed the radiological images as listed and agreed with the findings in the report. DG Chest 2 View  Result Date: 08/21/2019 CLINICAL DATA:  Hepatocellular carcinoma.  Chest pain. EXAM: CHEST - 2 VIEW COMPARISON:  02/05/2018 and PET-CT 08/08/2019 FINDINGS: Slightly prominent interstitial markings in the left hilum are chronic. Stable sclerosis involving the anterior right fifth rib based on previous chest  radiograph and prior PET-CT. No significant airspace disease or lung consolidation. Heart and mediastinum are within normal limits. No large pleural effusions. IMPRESSION: No active cardiopulmonary disease. Electronically Signed   By: Markus Daft M.D.   On: 08/21/2019 10:14     ASSESSMENT & PLAN: 39 yo male with   1.Liver mass, hepatocellular carcinomain left and right lobe, with distant node metastasis, stage IV -He has a9.8 x5.8 x10.7 cm intensely hypermetabolic infiltrativeliver mass occupying nearly entire left lobe, with hypermetabolic nodules in the right lobe. Liver biopsy on 08/11/19 showed poorly differentiated carcinoma, IHC studies favor HCC, however, poorly differentiated cholangiocarcinoma is not ruled out. This could be mixed histology. The aggressive nature is not typical [place].  -staging PET from 08/08/19 also showed there are also hypermetabolic periportal, periaortic, precordial, and paratracheal nodes, consistent with nodal metastasis. This is unfortunately not curable disease -Dr. Barry Dienes reviewed his case, due to the right and left lobe involvement, plus nodal mets, he is not a surgical candidate. He is also not a candidate for liver directed therapy at this time.  -Due to the aggressive nature of his disease, He started first line systemic therapy with immunotherapy Tecentriq and biological therapy Avastin q3weeks for disease control. Goal of therapy is palliative. starting 08/15/19 -he tolerated cycle 1 relatively well until few days later he developed n/v which she thinks is from levaquin. This possibly exacerbated his Abdominal and epigastric pain. He was started on long active oxycontin on 3/29 and continues oxy IR 1 tab q4.  -He was seen in [facility] for dehydration and pain management on 3/31, his pain improved but unfortunately he had severe pain overnight and is back today for additional IVF and pain management.  -Mr. Both appears stable. He has persistent epigastric pain that is  now also in the LUQ and ribs. Pain is owrsened on inspiration. He has palpable tenderness in the ribs. This is possibly costochondral strain from n/v. He reported chest pain, however this does not appear cardiac in nature. Chest xray is normal today -Will increase oxycontin 10 mg to q8 hours. If not enough, will change to 20 mg q12. He will continue oxy IR 1 tab q4 PRN. If pain is 9 or 10, he can take 2 tabs. We reviewed addictive nature and side effects of these meds.  -For additional pain control, Dr. Burr Medico recommends adding Cymbalta  20 mg. After reviewing side effects he agrees to try.  -We are referring him to palliative medicine clinic for additional pain management. He agrees.  -We will set up IVF today, this weekend and f/u with IVF early next week.  -Due to frequent supportive care needs, I recommend for him to consider PAC placement, after some hesitation since his diagnosis, he agrees to proceed with that. The order was placed.  -F/u next week  2. Hypercalcemia of malignancy -Corrected Ca 13.1 on 08/15/19, he was symptomatic with constipation, fatigue, weakness -S/p zometa on 3/26 -Ca 8.0 uncorrected on 3/31 -constipation improved after zometa -monitoring  3. Fever spikes -he has persistent fever spikes since his diagnosis  -low grade fever spikes, 100-100.9 range. No significant chills -clinically no signs of infection, WBC 11.9 on 3/24 -UA and blood cultures were negative -He was treated with 7 day course of levaquin. Fevers resolved. This could also be related to tumor fever  4.Epigastric pain, secondary to #1 -Previously managed with oxycodone 5 mg PRN, he takes 2-3 tabs per day. -his pain worsened and he takes 2 tabs q6 which is effective for about 4 hours. We discussed long acting pain management. He is interested. We reviewed potential side effects of oxycontin and he agrees to try. Will start 10 mg q12 with oxycodone 5 mg q6 PRN for breakthrough -pain remains  uncontrolled; seen in [facility] 3/31 and received IV morphine. He will be treated again today with IV Morphine 2 mg. Will increase oxycontin to 10 mg q8H, continue oxy IR 5 mg q4. Will add cymbalta and refer to palliative care for recommendations   5.N/V/C, weight loss -Secondary to #1, constipation related to opioids -We discussed bowel regimenand nutrition  -I prescribedZofran PRN, and encouraged him to use before meals to promote weight gain and improved nutrition -Due to worsening pain, he increased oxycodone to 10 mg q6 (2 tabs), he has constipation not responding well to miralax. He does not tolerate mag citrate.  -His constipation is also related to hypercalcemia. 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 Loletha Carrow, on [address] does not know how he contracted Hep Bor when exactly -No evidence of ascites on imaging -he abstains from alcohol, no use in >1 year -Tbili normalized.  -followed by Dr. Loletha Carrow - EGD on 3/25 showed no varices. Colonoscopy did not support colorectal primary. 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 If he takes FMLA, he will lose his job and insurance.  -His wife is starting new job soon, but her insurance may not be that good. -Has 3 sons at home ages 18, 20, 60 -Will follow closely and refer to SW if needed -not currently working due to his symptoms -Met with Stefanie Libel financial advocate today to discuss [place]   8. History of T-cellnon-hodgkins lymphoma, 1994 -s/p chemotherapy(including methotrexate)at [facility]  PLAN: -Labs from 08/20/19 reviewed -Chest xray reviewed [facility] with IVF, IV morphine today, then IVF on 4/3 and 4/5 or 4/6 with follow up  -Referral to palliative care -Referral to IR for PAC placement  -Increase oxycontin to 10 mg q8H, refilled -New Rx for cymbalta 20 mg once daily, sent to pharmacy -Continue  oxy IR 5 mg q4 PRN -Patient seen with Dr. Burr Medico   No problem-specific Assessment & Plan notes found for this encounter.   Orders Placed This Encounter  Procedures  . DG Chest  2 View    Standing Status:   Future    Number of Occurrences:   1    Standing Expiration Date:   08/20/2020    Order Specific Question:   Reason for Exam (SYMPTOM  OR DIAGNOSIS REQUIRED)    Answer:   chest pain    Order Specific Question:   Preferred imaging location?    Answer:   [facility]    Order Specific Question:   Radiology Contrast Protocol - do NOT remove file path    Answer:   [email or web address]  . IR Fluoro Guide CV Line Right    PAC; Patient had previous central line for NHL in 1994    Standing Status:   Future    Standing Expiration Date:   10/20/2020    Order Specific Question:   Reason for exam:    Answer:   access for cancer treatment and frequent supportive care    Order Specific Question:   Preferred Imaging Location?    Answer:   [facility]  . Amb Referral to Palliative Care    Referral Priority:   Urgent    Referral Type:   Consultation    Referral Reason:   Symptom Managment    Number of Visits Requested:   1   All questions were answered. The patient knows to call the clinic with any problems, questions or concerns. No barriers to learning was detected.     Alla Feeling, NP 08/21/19   Addendum  I have seen the patient, examined him. I agree with the assessment and and plan and have edited the notes.   Mr. Livolsi has developed worsening abdominal pain, pain management reviewed. We reviewed narcotics dependence and management of side effects. Will continue supportive care with IVF today and again in 2 days. Will see him back early next week for close f/u.  Truitt Merle  08/21/2019

## 2019-08-22 ENCOUNTER — Telehealth: Payer: Self-pay | Admitting: Nurse Practitioner

## 2019-08-22 NOTE — Progress Notes (Signed)
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 Large diffusely infiltrative mass centered in the left lobe of the liver, with indeterminate but very aggressive imaging characteristics, highly concerning for infiltrative tumor considered likely hepatocellular carcinoma.   07/30/2019 Initial Diagnosis   Hepatocellular carcinoma ([place])   08/08/2019 PET scan   PET 08/08/19 IMPRESSION: 1. Intensely hypermetabolic infiltrative mass occupying the near entirety of the LEFT hepatic lobe consistent with primary hepatic malignancy. 2. Individual discrete hypermetabolic nodules within the superior aspect of the RIGHT hepatic lobe. 3. Hypermetabolic metastatic adenopathy in the periportal lymph nodes and periaortic lymph nodes upper abdomen. 4. Hypermetabolic lymph node in the precordial space and a RIGHT lower paratracheal lymph node.   08/11/2019 Initial Biopsy   FINAL MICROSCOPIC DIAGNOSIS: 08/11/19  A. LIVER, LEFT HEPATIC LOBE, BIOPSY:  - Poorly differentiated carcinoma.  - See comment.   COMMENT:  The poorly differentiated carcinoma is positive with cytokeratin 7 and  cytokeratin 20 and there is variable positivity with HepPar 1,  Glypican-3, polyclonal CEA and MOC31.  The tumor is negative with  arginase, CDX2, cytokeratin 5/6, TTF-1 and Napsin-A.  The  immunophenotype is not specific but favors poorly differentiated  hepatocellular carcinoma.  The differential diagnosis however includes  poorly differentiated cholangiocarcinoma.    08/14/2019 Procedure   Upper Endoscopy by Dr  Loletha Carrow 08/14/19 IMPRESSION - Normal esophagus. - Normal stomach. - Normal examined duodenum. - No specimens collected.  Colonoscopy  IMPRESSION - The entire examined colon is normal on direct and retroflexion views. - No specimens collected.    08/15/2019 -  Chemotherapy   First LineTecentriq and Avastin q3weeks starting 08/15/19      CURRENT THERAPY:  First Line Tecentriq and Avastin q3weeks starting 08/15/19  INTERVAL HISTORY:  Maurice Lowe is here for a follow up and treatment. He presents to the clinic with his family member. He notes he is feeling better today. He notes he was eating well and drinking well this past weekend. He notes he is trying to manage his pain. He has been taking OxyContin 38m q8hours and oxycodone 534mq8hours which is controlling his pain. He notes he did not pick up his 08/13/19 oxycodone refill. He notes he feels he is having hot flashes, he denies fevers. This can occur anytime for him. He notes he has been able to sleep well. He notes he did have drenching night sweats but not currently. He notes he took Reglan and started having twitching so he stopped. He notes he tried Marijuana which helps the first day with nausea, eating and pain but did not last. He notes he is drinking about 3-4 of 8 ounce drinks a day.     REVIEW OF SYSTEMS:   Constitutional: Denies fevers, chills or abnormal weight loss (+) Hot flashes.  Eyes: Denies blurriness of vision Ears, nose, mouth, throat, and face: Denies mucositis or sore throat Respiratory: Denies cough, dyspnea or wheezes Cardiovascular: Denies palpitation, chest discomfort or lower extremity swelling Gastrointestinal:  Denies nausea, heartburn or change in bowel habits (+) Abdominal pain, controlled Skin: Denies abnormal skin rashes Lymphatics: Denies new lymphadenopathy or  easy bruising Neurological:Denies numbness, tingling or new weaknesses Behavioral/Psych: Mood is stable, no new changes  All other  systems were reviewed with the patient and are negative.  MEDICAL HISTORY:  Past Medical History:  Diagnosis Date  . Elevated liver function tests   . Hepatic cyst   . Hepatitis   . Hepatitis B   . Non Hodgkin's lymphoma [facility])    age [age]    SURGICAL HISTORY: Past Surgical History:  Procedure Laterality Date  . PORT-A-CATH REMOVAL    . PORTACATH PLACEMENT      I have reviewed the social history and family history with the patient and they are unchanged from previous note.  ALLERGIES:  is allergic to phenergan [promethazine].  MEDICATIONS:  Current Outpatient Medications  Medication Sig Dispense Refill  . dronabinol (MARINOL) 2.5 MG capsule Take 1 capsule (2.5 mg total) by mouth 2 (two) times daily before a meal. 30 capsule 0  . DULoxetine (CYMBALTA) 20 MG capsule Take 1 capsule (20 mg total) by mouth daily. 30 capsule 0  . entecavir (BARACLUDE) 0.5 MG tablet TAKE 1 TABLET BY MOUTH DAILY 30 tablet 2  . Lactulose 20 GM/30ML SOLN Take 30 mLs (20 g total) by mouth every 4 (four) hours as needed (for severe constipation). 450 mL 1  . levofloxacin (LEVAQUIN) 750 MG tablet Take 1 tablet (750 mg total) by mouth daily. 7 tablet 0  . lidocaine-prilocaine (EMLA) cream Apply 1 application topically as needed. 30 g 2  . metoCLOPramide (REGLAN) 5 MG tablet Take 1 tablet (5 mg total) by mouth 4 (four) times daily -  before meals and at bedtime. 160 tablet 2  . ondansetron (ZOFRAN) 8 MG tablet Take 1 tablet (8 mg total) by mouth every 8 (eight) hours as needed for nausea or vomiting. 45 tablet 1  . oxyCODONE (OXY IR/ROXICODONE) 5 MG immediate release tablet Take 1-2 tablets (5-10 mg total) by mouth every 6 (six) hours as needed for severe pain. 60 tablet 0  . oxyCODONE (OXYCONTIN) 10 mg 12 hr tablet Take 1 tablet (10 mg total) by mouth every 8 (eight) hours. 90 tablet 0  . prochlorperazine (COMPAZINE) 5 MG tablet Take 1 tablet (5 mg total) by mouth every 8 (eight) hours as needed for nausea or  vomiting. 30 tablet 0   Current Facility-Administered Medications  Medication Dose Route Frequency Provider Last Rate Last Admin  . 0.9 %  sodium chloride infusion  500 mL Intravenous Once Nelida Meuse III, MD        PHYSICAL EXAMINATION: ECOG PERFORMANCE STATUS: 2 - Symptomatic, <50% confined to bed  Vitals:   08/25/19 0905  BP: 124/83  Pulse: 89  Resp: 18  Temp: 98.3 F (36.8 C)  SpO2: 100%   Filed Weights   08/25/19 0905  Weight: 121 lb 3.2 oz (55 kg)    Due to COVID19 we will limit examination to appearance. Patient had no complaints.  GENERAL:alert, no distress and comfortable SKIN: skin color normal, no rashes or significant lesions EYES: normal, Conjunctiva are pink and non-injected, sclera clear  NEURO: alert & oriented x 3 with fluent speech   LABORATORY DATA:  I have reviewed the data as listed CBC Latest Ref Rng & Units 08/25/2019 08/20/2019 08/15/2019  WBC 4.0 - 10.5 K/uL 9.5 9.6 9.0  Hemoglobin 13.0 - 17.0 g/dL 13.2 13.5 10.8(L)  Hematocrit 39.0 - 52.0 % 41.0 41.6 31.8(L)  Platelets 150 - 400 K/uL 391 342 292     CMP Latest Ref  Rng & Units 08/25/2019 08/20/2019 08/15/2019  Glucose 70 - 99 mg/dL 103(H) 87 98  BUN 6 - 20 mg/dL <4(L) 5(L) 6  Creatinine 0.61 - 1.24 mg/dL 0.78 0.82 0.73  Sodium 135 - 145 mmol/L 133(L) 135 133(L)  Potassium 3.5 - 5.1 mmol/L 4.4 4.3 3.7  Chloride 98 - 111 mmol/L 100 101 101  CO2 22 - 32 mmol/L 23 20(L) 22  Calcium 8.9 - 10.3 mg/dL 9.5 8.0(L) 8.9  Total Protein 6.5 - 8.1 g/dL 9.9(H) 9.8(H) 8.4(H)  Total Bilirubin 0.3 - 1.2 mg/dL 0.5 0.4 0.5  Alkaline Phos 38 - 126 U/L 141(H) 139(H) 103  AST 15 - 41 U/L 79(H) 73(H) 62(H)  ALT 0 - 44 U/L 24 20 19       RADIOGRAPHIC STUDIES: I have personally reviewed the radiological images as listed and agreed with the findings in the report. No results found.   ASSESSMENT & PLAN:  Maurice Lowe is a 39 y.o. male with    1. 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 His mass was found to be Poorly differentiated carcinoma on 08/11/19 liver biopsy. The IHC studies favor poorly differentiated hepatocellular carcinoma, however poorly differentiated cholangiocarcinoma is not ruled out. I discussed the above with patient today, this could be mixed histology of HCC and cholangiocarcinoma. His clinical presentation is concerning for aggressive malignancy, not typical [place]. -His staging PET from 08/08/19 also showed there are also hypermetabolic periportal, periaortic, precordial, and paratracheal nodes, consistent with nodal metastasis. This is unfortunately not curable disease -Due to the aggressive nature of his disease, I recommended first line systemic therapy with immunotherapy Tecentriq and biological therapy Avastin q3weeks for disease control. Goal of therapy is palliative. He started on 08/15/19.  -If he does not respond to Tecentriq and Avastin, I will switch him to chemotherapy with cisplatin and gemcitabine. -He has managed his first cycle chemo moderately well. I reviewed antiemetic use, appetite stimulant and pain medications. His pain is currently controlled and his appetite and drinking has improved.  -Labs reviewed, CBC WNL. CMP still pending.  -F/u with C2 on 4/15.     2. Fever Spikes, likely tumor fever  -Began initially with diagnosis, persistent -Low grade fever spikes, 100-100.9 range. No significant chills.  -urine and blood cultures negative from 08/15/19, no evidence of pneumonia or other source of infection such as abscess from recent PET scan. Clinically no signs of infection, WBC 11.9 on 3/24 -He was treated with 7 day course of Levaquin. Fevers resolved.  -He notes having hot flashes, without fever which can occur anytime. So far manageable.    3.  Hypercalcemia of malignancy -Recently developed and was symptomatic with constipation, fatigue, weakness. He was treated with IVF and Zometa on 08/13/19.  -repeated CMP today is still pending     4. Epigastric pain, secondary to #1 -Previously managed with oxycodone 5 mg PRN, he takes 2-3 tabs per day. -Due to worsened pain he is currently on OxyContin 57m q8hours and breakthrough  oxycodone 558mq8hours which is controlling his pain.  -He notes gas is causes him pain lately as well. I recommend OTC gas-x to help as needed.  -He has not started Cymbalta for pain yet. I encouraged him to try it as it may reduce need for narcotics.    5. N/V/C,  weight loss  -Secondary to #1, constipation related to opioids -His constipation is also related to hypercalcemia. We previously discussed bowel regimenand nutrition, he will use magnesium citrate and lactulose -He started having twitching with Reglan and has stopped. He can continue Zofran. He did not mentally tolerate Phenergan well. I will call in low dose compazine which he can start from half tablet (08/25/19). Will monitor.  -He notes he tried Marijuana which helps the first day with nausea, eating and pain but did not last. I will call in Marinol BID before meals which can helps his appetite (08/25/19). He is agreeable. -I encouraged him to drink 40 ounces daily.     6. 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 Loletha Carrow, on Entecavir, he does not know how he contracted Hep B or when exactly -No evidence of ascites on imaging  -He abstains from alcohol, no use in >1 year -Tbili 1-1.3 lately  -08/14/19 EGD was normal, no varices    7. 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 If he takes FMLA, he will lose his job and insurance.  -His wife is starting new job soon, but her insurance may not be that good. -Has 3 sons at home ages 51, 74, 48 -Will follow closely and refer to SW if needed -not currently working  due to his symptoms -Met with Stefanie Libel financial advocate today to discuss [place]     8. History of T-cell non-hodgkins lymphoma, 1994 -s/p chemotherapy (including methotrexate) at [facility]     PLAN: -I called in low dose compazine and Marinol today for his nausea and low appetite  -Lab, F/u and Tecentriq and Avastin on 4/15 -will cancel his IVF today   No problem-specific Assessment & Plan notes found for this encounter.   No orders of the defined types were placed in this encounter.  All questions were answered. The patient knows to call the clinic with any problems, questions or concerns. No barriers to learning was detected. The total time spent in the appointment was 30 minutes.     Truitt Merle, MD 08/25/2019   I, Joslyn Devon, am acting as scribe for Truitt Merle, MD.   I have reviewed the above documentation for accuracy and completeness, and I agree with the above.

## 2019-08-22 NOTE — Telephone Encounter (Signed)
Scheduled appts per 4/1 los. Pt declined to come in for suggested IVF on 4/2 or 4/3. Pt  stated he was feeling much better and felt hydrated after drinking a lot of water. Pt confirmed next appt date and time.

## 2019-08-25 ENCOUNTER — Encounter: Payer: Self-pay | Admitting: Nurse Practitioner

## 2019-08-25 ENCOUNTER — Other Ambulatory Visit: Payer: Self-pay

## 2019-08-25 ENCOUNTER — Inpatient Hospital Stay (HOSPITAL_BASED_OUTPATIENT_CLINIC_OR_DEPARTMENT_OTHER): Payer: BC Managed Care – PPO | Admitting: Hematology

## 2019-08-25 ENCOUNTER — Inpatient Hospital Stay: Payer: BC Managed Care – PPO

## 2019-08-25 ENCOUNTER — Encounter: Payer: Self-pay | Admitting: Hematology

## 2019-08-25 VITALS — BP 124/83 | HR 89 | Temp 98.3°F | Resp 18 | Ht 66.0 in | Wt 121.2 lb

## 2019-08-25 DIAGNOSIS — C220 Liver cell carcinoma: Secondary | ICD-10-CM

## 2019-08-25 DIAGNOSIS — R109 Unspecified abdominal pain: Secondary | ICD-10-CM | POA: Diagnosis not present

## 2019-08-25 DIAGNOSIS — T402X5A Adverse effect of other opioids, initial encounter: Secondary | ICD-10-CM | POA: Diagnosis not present

## 2019-08-25 DIAGNOSIS — R110 Nausea: Secondary | ICD-10-CM | POA: Diagnosis not present

## 2019-08-25 DIAGNOSIS — R509 Fever, unspecified: Secondary | ICD-10-CM | POA: Diagnosis not present

## 2019-08-25 DIAGNOSIS — R634 Abnormal weight loss: Secondary | ICD-10-CM | POA: Diagnosis not present

## 2019-08-25 DIAGNOSIS — Z79899 Other long term (current) drug therapy: Secondary | ICD-10-CM | POA: Diagnosis not present

## 2019-08-25 DIAGNOSIS — K5903 Drug induced constipation: Secondary | ICD-10-CM | POA: Diagnosis not present

## 2019-08-25 LAB — CBC WITH DIFFERENTIAL (CANCER CENTER ONLY)
Abs Immature Granulocytes: 0.03 10*3/uL (ref 0.00–0.07)
Basophils Absolute: 0 10*3/uL (ref 0.0–0.1)
Basophils Relative: 0 %
Eosinophils Absolute: 0 10*3/uL (ref 0.0–0.5)
Eosinophils Relative: 0 %
HCT: 41 % (ref 39.0–52.0)
Hemoglobin: 13.2 g/dL (ref 13.0–17.0)
Immature Granulocytes: 0 %
Lymphocytes Relative: 18 %
Lymphs Abs: 1.7 10*3/uL (ref 0.7–4.0)
MCH: 27.2 pg (ref 26.0–34.0)
MCHC: 32.2 g/dL (ref 30.0–36.0)
MCV: 84.5 fL (ref 80.0–100.0)
Monocytes Absolute: 1 10*3/uL (ref 0.1–1.0)
Monocytes Relative: 11 %
Neutro Abs: 6.7 10*3/uL (ref 1.7–7.7)
Neutrophils Relative %: 71 %
Platelet Count: 391 10*3/uL (ref 150–400)
RBC: 4.85 MIL/uL (ref 4.22–5.81)
RDW: 13.7 % (ref 11.5–15.5)
WBC Count: 9.5 10*3/uL (ref 4.0–10.5)
nRBC: 0 % (ref 0.0–0.2)

## 2019-08-25 LAB — CMP (CANCER CENTER ONLY)
ALT: 24 U/L (ref 0–44)
AST: 79 U/L — ABNORMAL HIGH (ref 15–41)
Albumin: 2.7 g/dL — ABNORMAL LOW (ref 3.5–5.0)
Alkaline Phosphatase: 141 U/L — ABNORMAL HIGH (ref 38–126)
Anion gap: 10 (ref 5–15)
BUN: <4 mg/dL — ABNORMAL LOW (ref 6–20)
CO2: 23 mmol/L (ref 22–32)
Calcium: 9.5 mg/dL (ref 8.9–10.3)
Chloride: 100 mmol/L (ref 98–111)
Creatinine: 0.78 mg/dL (ref 0.61–1.24)
GFR, Est AFR Am: >60 mL/min (ref >60)
GFR, Estimated: >60 mL/min (ref >60)
Glucose, Bld: 103 mg/dL — ABNORMAL HIGH (ref 70–99)
Potassium: 4.4 mmol/L (ref 3.5–5.1)
Sodium: 133 mmol/L — ABNORMAL LOW (ref 135–145)
Total Bilirubin: 0.5 mg/dL (ref 0.3–1.2)
Total Protein: 9.9 g/dL — ABNORMAL HIGH (ref 6.5–8.1)

## 2019-08-25 MED ORDER — DRONABINOL 2.5 MG PO CAPS: 2.5000 mg | ORAL_CAPSULE | Freq: Two times a day (BID) | ORAL | 0 refills | Status: DC

## 2019-08-25 MED ORDER — LIDOCAINE-PRILOCAINE 2.5-2.5 % EX CREA: 1.0000 "application " | TOPICAL_CREAM | CUTANEOUS | 2 refills | Status: AC | PRN

## 2019-08-25 MED ORDER — PROCHLORPERAZINE MALEATE 5 MG PO TABS: 5.0000 mg | ORAL_TABLET | Freq: Three times a day (TID) | ORAL | 0 refills | Status: AC | PRN

## 2019-08-26 ENCOUNTER — Telehealth: Payer: Self-pay | Admitting: *Deleted

## 2019-08-26 ENCOUNTER — Other Ambulatory Visit: Payer: Self-pay | Admitting: Nurse Practitioner

## 2019-08-26 ENCOUNTER — Encounter: Payer: Self-pay | Admitting: Hematology

## 2019-08-26 ENCOUNTER — Telehealth: Payer: Self-pay | Admitting: Hematology

## 2019-08-26 MED ORDER — OXYCODONE HCL 5 MG PO TABS: 5.0000 mg | ORAL_TABLET | Freq: Four times a day (QID) | ORAL | 0 refills | Status: DC | PRN

## 2019-08-26 NOTE — Telephone Encounter (Signed)
Maurice Lowe left a Pharmacist, community message stating that he was unable to fill his oxycodone 5 mg prescription.  I called the pharmacy and he needs a refill.

## 2019-08-26 NOTE — Telephone Encounter (Signed)
Spoke with pharmacist about status of Oxycodone 5 mg prior authorization request received today.  "Patient picked up Oxy-IR on 3-25-202, too soon to refill.  However will need prior authorization for next refill."  Proceeding through CoverMyMeds.

## 2019-08-26 NOTE — Telephone Encounter (Signed)
No 4/5 los. No changes made to pt's schedule.  

## 2019-08-29 ENCOUNTER — Encounter: Payer: Self-pay | Admitting: Hematology

## 2019-08-29 ENCOUNTER — Encounter: Payer: Self-pay | Admitting: Nurse Practitioner

## 2019-08-29 NOTE — Telephone Encounter (Signed)
I spoke with Mr. Maurice Lowe.  He is unable to tolerate solid food.  He has not been taking the antiemetics regularly.  I have instructed him to alternated zofran and marinol to take zofran in the am, afternoon and at night,  I told him to take the marinol mid morning and mid afternoon. I have instructed him to sip at clear liqids and to eat a bland, low fiber diet.  I suggested some appropriate food choices.  I told him if this does not help to go to the ED.  He verbalized understanding

## 2019-08-29 NOTE — Progress Notes (Signed)

## 2019-08-30 ENCOUNTER — Other Ambulatory Visit: Payer: Self-pay | Admitting: Radiology

## 2019-08-30 ENCOUNTER — Inpatient Hospital Stay (HOSPITAL_COMMUNITY)
Admission: EM | Admit: 2019-08-30 | Discharge: 2019-09-09 | DRG: 981 | Disposition: A | Discharge status: Hospice | Payer: BC Managed Care – PPO | Attending: Internal Medicine | Admitting: Internal Medicine

## 2019-08-30 ENCOUNTER — Encounter (HOSPITAL_COMMUNITY): Payer: Self-pay | Admitting: *Deleted

## 2019-08-30 ENCOUNTER — Other Ambulatory Visit: Payer: Self-pay

## 2019-08-30 ENCOUNTER — Emergency Department (HOSPITAL_COMMUNITY): Payer: BC Managed Care – PPO

## 2019-08-30 DIAGNOSIS — R52 Pain, unspecified: Secondary | ICD-10-CM | POA: Diagnosis not present

## 2019-08-30 DIAGNOSIS — R1011 Right upper quadrant pain: Secondary | ICD-10-CM | POA: Diagnosis not present

## 2019-08-30 DIAGNOSIS — G9341 Metabolic encephalopathy: Secondary | ICD-10-CM | POA: Diagnosis not present

## 2019-08-30 DIAGNOSIS — Z888 Allergy status to other drugs, medicaments and biological substances status: Secondary | ICD-10-CM

## 2019-08-30 DIAGNOSIS — D649 Anemia, unspecified: Secondary | ICD-10-CM | POA: Diagnosis present

## 2019-08-30 DIAGNOSIS — K7200 Acute and subacute hepatic failure without coma: Secondary | ICD-10-CM | POA: Diagnosis not present

## 2019-08-30 DIAGNOSIS — G893 Neoplasm related pain (acute) (chronic): Secondary | ICD-10-CM | POA: Diagnosis not present

## 2019-08-30 DIAGNOSIS — C772 Secondary and unspecified malignant neoplasm of intra-abdominal lymph nodes: Secondary | ICD-10-CM | POA: Diagnosis present

## 2019-08-30 DIAGNOSIS — Z681 Body mass index (BMI) 19 or less, adult: Secondary | ICD-10-CM | POA: Diagnosis not present

## 2019-08-30 DIAGNOSIS — R627 Adult failure to thrive: Secondary | ICD-10-CM | POA: Diagnosis present

## 2019-08-30 DIAGNOSIS — B181 Chronic viral hepatitis B without delta-agent: Secondary | ICD-10-CM | POA: Diagnosis not present

## 2019-08-30 DIAGNOSIS — R1013 Epigastric pain: Secondary | ICD-10-CM | POA: Diagnosis not present

## 2019-08-30 DIAGNOSIS — Z9221 Personal history of antineoplastic chemotherapy: Secondary | ICD-10-CM

## 2019-08-30 DIAGNOSIS — E871 Hypo-osmolality and hyponatremia: Secondary | ICD-10-CM | POA: Diagnosis not present

## 2019-08-30 DIAGNOSIS — G8929 Other chronic pain: Secondary | ICD-10-CM | POA: Diagnosis not present

## 2019-08-30 DIAGNOSIS — E872 Acidosis: Secondary | ICD-10-CM | POA: Diagnosis not present

## 2019-08-30 DIAGNOSIS — Z66 Do not resuscitate: Secondary | ICD-10-CM | POA: Diagnosis not present

## 2019-08-30 DIAGNOSIS — T402X5A Adverse effect of other opioids, initial encounter: Secondary | ICD-10-CM | POA: Diagnosis present

## 2019-08-30 DIAGNOSIS — Z7401 Bed confinement status: Secondary | ICD-10-CM | POA: Diagnosis not present

## 2019-08-30 DIAGNOSIS — R1084 Generalized abdominal pain: Secondary | ICD-10-CM | POA: Diagnosis not present

## 2019-08-30 DIAGNOSIS — C220 Liver cell carcinoma: Secondary | ICD-10-CM | POA: Diagnosis not present

## 2019-08-30 DIAGNOSIS — R531 Weakness: Secondary | ICD-10-CM | POA: Diagnosis not present

## 2019-08-30 DIAGNOSIS — R112 Nausea with vomiting, unspecified: Secondary | ICD-10-CM

## 2019-08-30 DIAGNOSIS — Z20822 Contact with and (suspected) exposure to covid-19: Secondary | ICD-10-CM | POA: Diagnosis present

## 2019-08-30 DIAGNOSIS — R64 Cachexia: Secondary | ICD-10-CM | POA: Diagnosis present

## 2019-08-30 DIAGNOSIS — E860 Dehydration: Secondary | ICD-10-CM | POA: Diagnosis present

## 2019-08-30 DIAGNOSIS — R4182 Altered mental status, unspecified: Secondary | ICD-10-CM | POA: Diagnosis not present

## 2019-08-30 DIAGNOSIS — Z79891 Long term (current) use of opiate analgesic: Secondary | ICD-10-CM

## 2019-08-30 DIAGNOSIS — F05 Delirium due to known physiological condition: Secondary | ICD-10-CM | POA: Diagnosis not present

## 2019-08-30 DIAGNOSIS — K5900 Constipation, unspecified: Secondary | ICD-10-CM

## 2019-08-30 DIAGNOSIS — K7460 Unspecified cirrhosis of liver: Secondary | ICD-10-CM | POA: Diagnosis present

## 2019-08-30 DIAGNOSIS — E43 Unspecified severe protein-calorie malnutrition: Secondary | ICD-10-CM | POA: Diagnosis not present

## 2019-08-30 DIAGNOSIS — Z8572 Personal history of non-Hodgkin lymphomas: Secondary | ICD-10-CM

## 2019-08-30 DIAGNOSIS — K5903 Drug induced constipation: Secondary | ICD-10-CM | POA: Diagnosis present

## 2019-08-30 DIAGNOSIS — Z823 Family history of stroke: Secondary | ICD-10-CM

## 2019-08-30 DIAGNOSIS — Z515 Encounter for palliative care: Secondary | ICD-10-CM | POA: Diagnosis not present

## 2019-08-30 DIAGNOSIS — Z03818 Encounter for observation for suspected exposure to other biological agents ruled out: Secondary | ICD-10-CM | POA: Diagnosis not present

## 2019-08-30 DIAGNOSIS — M2550 Pain in unspecified joint: Secondary | ICD-10-CM | POA: Diagnosis not present

## 2019-08-30 DIAGNOSIS — Z7189 Other specified counseling: Secondary | ICD-10-CM | POA: Diagnosis not present

## 2019-08-30 DIAGNOSIS — Z79899 Other long term (current) drug therapy: Secondary | ICD-10-CM

## 2019-08-30 DIAGNOSIS — B1910 Unspecified viral hepatitis B without hepatic coma: Secondary | ICD-10-CM | POA: Diagnosis not present

## 2019-08-30 DIAGNOSIS — Z87891 Personal history of nicotine dependence: Secondary | ICD-10-CM

## 2019-08-30 DIAGNOSIS — Z5111 Encounter for antineoplastic chemotherapy: Secondary | ICD-10-CM | POA: Diagnosis not present

## 2019-08-30 DIAGNOSIS — Z8249 Family history of ischemic heart disease and other diseases of the circulatory system: Secondary | ICD-10-CM

## 2019-08-30 LAB — CBC WITH DIFFERENTIAL/PLATELET
Abs Immature Granulocytes: 0.05 10*3/uL (ref 0.00–0.07)
Basophils Absolute: 0 10*3/uL (ref 0.0–0.1)
Basophils Relative: 0 %
Eosinophils Absolute: 0 10*3/uL (ref 0.0–0.5)
Eosinophils Relative: 0 %
HCT: 41 % (ref 39.0–52.0)
Hemoglobin: 13.2 g/dL (ref 13.0–17.0)
Immature Granulocytes: 0 %
Lymphocytes Relative: 11 %
Lymphs Abs: 1.3 10*3/uL (ref 0.7–4.0)
MCH: 27.5 pg (ref 26.0–34.0)
MCHC: 32.2 g/dL (ref 30.0–36.0)
MCV: 85.4 fL (ref 80.0–100.0)
Monocytes Absolute: 1.3 10*3/uL — ABNORMAL HIGH (ref 0.1–1.0)
Monocytes Relative: 11 %
Neutro Abs: 9.2 10*3/uL — ABNORMAL HIGH (ref 1.7–7.7)
Neutrophils Relative %: 78 %
Platelets: 333 10*3/uL (ref 150–400)
RBC: 4.8 MIL/uL (ref 4.22–5.81)
RDW: 14 % (ref 11.5–15.5)
WBC: 11.9 10*3/uL — ABNORMAL HIGH (ref 4.0–10.5)
nRBC: 0 % (ref 0.0–0.2)

## 2019-08-30 LAB — COMPREHENSIVE METABOLIC PANEL
ALT: 26 U/L (ref 0–44)
AST: 124 U/L — ABNORMAL HIGH (ref 15–41)
Albumin: 2.9 g/dL — ABNORMAL LOW (ref 3.5–5.0)
Alkaline Phosphatase: 129 U/L — ABNORMAL HIGH (ref 38–126)
Anion gap: 9 (ref 5–15)
BUN: 15 mg/dL (ref 6–20)
CO2: 25 mmol/L (ref 22–32)
Calcium: 11 mg/dL — ABNORMAL HIGH (ref 8.9–10.3)
Chloride: 94 mmol/L — ABNORMAL LOW (ref 98–111)
Creatinine, Ser: 0.7 mg/dL (ref 0.61–1.24)
GFR calc Af Amer: >60 mL/min (ref >60)
GFR calc non Af Amer: >60 mL/min (ref >60)
Glucose, Bld: 108 mg/dL — ABNORMAL HIGH (ref 70–99)
Potassium: 4.4 mmol/L (ref 3.5–5.1)
Sodium: 128 mmol/L — ABNORMAL LOW (ref 135–145)
Total Bilirubin: 0.9 mg/dL (ref 0.3–1.2)
Total Protein: 10 g/dL — ABNORMAL HIGH (ref 6.5–8.1)

## 2019-08-30 MED ORDER — PROCHLORPERAZINE MALEATE 10 MG PO TABS: 5.0000 mg | ORAL_TABLET | Freq: Three times a day (TID) | ORAL | Status: DC | PRN

## 2019-08-30 MED ORDER — PROCHLORPERAZINE EDISYLATE 10 MG/2ML IJ SOLN
10.0000 mg | Freq: Once | INTRAMUSCULAR | Status: AC
  Administered 2019-08-30: 10 mg via INTRAVENOUS
  Filled 2019-08-30: qty 2

## 2019-08-30 MED ORDER — DRONABINOL 2.5 MG PO CAPS
2.5000 mg | ORAL_CAPSULE | Freq: Once | ORAL | Status: AC
  Administered 2019-08-30: 2.5 mg via ORAL
  Filled 2019-08-30: qty 1

## 2019-08-30 MED ORDER — HYDROMORPHONE HCL 1 MG/ML IJ SOLN
1.0000 mg | INTRAMUSCULAR | Status: DC | PRN
  Administered 2019-08-30 – 2019-09-06 (×20): 1 mg via INTRAVENOUS
  Filled 2019-08-30 (×20): qty 1

## 2019-08-30 MED ORDER — OXYCODONE HCL ER 10 MG PO T12A
10.0000 mg | EXTENDED_RELEASE_TABLET | Freq: Three times a day (TID) | ORAL | Status: DC
  Administered 2019-08-30 – 2019-09-03 (×12): 10 mg via ORAL
  Filled 2019-08-30 (×12): qty 1

## 2019-08-30 MED ORDER — SODIUM CHLORIDE 0.9 % IV SOLN: INTRAVENOUS | Status: DC

## 2019-08-30 MED ORDER — SODIUM CHLORIDE 0.9 % IV BOLUS (SEPSIS)
1000.0000 mL | Freq: Once | INTRAVENOUS | Status: AC
  Administered 2019-08-30: 1000 mL via INTRAVENOUS

## 2019-08-30 MED ORDER — DRONABINOL 2.5 MG PO CAPS
2.5000 mg | ORAL_CAPSULE | Freq: Two times a day (BID) | ORAL | Status: DC
  Administered 2019-08-30 – 2019-09-08 (×17): 2.5 mg via ORAL
  Filled 2019-08-30 (×18): qty 1

## 2019-08-30 MED ORDER — ENTECAVIR 0.5 MG PO TABS
0.5000 mg | ORAL_TABLET | Freq: Every day | ORAL | Status: DC
  Administered 2019-09-01 – 2019-09-08 (×7): 0.5 mg via ORAL

## 2019-08-30 MED ORDER — ACETAMINOPHEN 325 MG PO TABS
650.0000 mg | ORAL_TABLET | Freq: Three times a day (TID) | ORAL | Status: DC | PRN
  Filled 2019-08-30: qty 2

## 2019-08-30 MED ORDER — HYDROMORPHONE HCL 1 MG/ML IJ SOLN
1.0000 mg | Freq: Once | INTRAMUSCULAR | Status: AC
  Administered 2019-08-30: 1 mg via INTRAVENOUS
  Filled 2019-08-30: qty 1

## 2019-08-30 MED ORDER — LIDOCAINE-PRILOCAINE 2.5-2.5 % EX CREA: 1.0000 "application " | TOPICAL_CREAM | CUTANEOUS | Status: DC | PRN

## 2019-08-30 MED ORDER — ACETAMINOPHEN 650 MG RE SUPP: 650.0000 mg | Freq: Three times a day (TID) | RECTAL | Status: DC | PRN

## 2019-08-30 MED ORDER — OXYCODONE HCL 5 MG PO TABS
5.0000 mg | ORAL_TABLET | Freq: Four times a day (QID) | ORAL | Status: DC | PRN
  Administered 2019-08-30 – 2019-09-06 (×7): 5 mg via ORAL
  Filled 2019-08-30 (×8): qty 1

## 2019-08-30 MED ORDER — ONDANSETRON HCL 8 MG PO TABS: 8.0000 mg | ORAL_TABLET | Freq: Three times a day (TID) | ORAL | Status: DC | PRN

## 2019-08-30 MED ORDER — METOCLOPRAMIDE HCL 5 MG PO TABS
5.0000 mg | ORAL_TABLET | Freq: Three times a day (TID) | ORAL | Status: DC
  Administered 2019-08-30 – 2019-09-08 (×31): 5 mg via ORAL
  Filled 2019-08-30 (×32): qty 1

## 2019-08-30 MED ORDER — HEPARIN SODIUM (PORCINE) 5000 UNIT/ML IJ SOLN
5000.0000 [IU] | Freq: Two times a day (BID) | INTRAMUSCULAR | Status: DC
  Administered 2019-08-30 – 2019-08-31 (×3): 5000 [IU] via SUBCUTANEOUS
  Filled 2019-08-30 (×3): qty 1

## 2019-08-30 MED ORDER — DULOXETINE HCL 20 MG PO CPEP
20.0000 mg | ORAL_CAPSULE | Freq: Every day | ORAL | Status: DC
  Administered 2019-08-30 – 2019-09-08 (×9): 20 mg via ORAL
  Filled 2019-08-30 (×12): qty 1

## 2019-08-30 MED ORDER — LACTULOSE 10 GM/15ML PO SOLN: 20.0000 g | ORAL | Status: DC | PRN

## 2019-08-30 MED ORDER — LORAZEPAM 2 MG/ML IJ SOLN
1.0000 mg | Freq: Four times a day (QID) | INTRAMUSCULAR | Status: DC | PRN
  Administered 2019-09-03 – 2019-09-05 (×2): 1 mg via INTRAVENOUS
  Filled 2019-08-30 (×2): qty 1

## 2019-08-30 NOTE — H&P (Addendum)
History and Physical    Maurice Lowe [id number] DOB: [birth year] DOA: 08/30/2019  PCP: Patient, No Pcp Per  Patient coming from: Home  I have personally briefly reviewed patient's old medical records in [place]  Chief Complaint: Nausea vomiting abdominal pain  HPI: Maurice Lowe is a 39 y.o. male with medical history significant of hepatocellular carcinoma, hepatitis B, history of non-Hodgkin lymphoma who presents with ongoing abdominal pain in setting malignancy and nausea/ vomiting.  Patient reports he has been dealing with pain for quite a while now and his oncology team has been titrating his pain medications but he still maintains that he has severe epigastric pain at times and on occasion to his left side.  He states that for the past 1 week he has been mostly sedentary because movement exacerbates the pain and he has to be somewhat of an incline position to tolerate.  He reports he is also not had any real food for over a week now and has been vomiting every 4 hours.  He denies any fevers, chills but reports that he has occasional hot flashes.  He does not report any particular trigger to this episode.  It is unclear if he filled his duloxetine and or some of the pain medication changes from his last visit were implemented.  He does report constipation and reports his last bowel movement was 4 days ago but also has not eaten much in over a week.  He denies any chest pain, palpitations, shortness of breath.  Patient lives at home with wife and 3 children  Review of Systems: As per HPI otherwise 10 point review of systems negative.    Past Medical History:  Diagnosis Date  . Elevated liver function tests   . Hepatic cyst   . Hepatitis   . Hepatitis B   . Non Hodgkin's lymphoma [facility])    age [age]    Past Surgical History:  Procedure Laterality Date  . PORT-A-CATH REMOVAL    . PORTACATH PLACEMENT       reports that he has quit smoking. He has never used  smokeless tobacco. He reports previous alcohol use. He reports previous drug use. Drug: Marijuana.  Allergies  Allergen Reactions  . Phenergan [Promethazine] Other (See Comments)    Hyperactivity and agitation    Family History  Problem Relation Age of Onset  . Heart disease Mother   . Stroke Paternal Grandmother   . Hepatitis Neg Hx   . Liver disease Neg Hx   . Colon cancer Neg Hx   . Esophageal cancer Neg Hx   . Stomach cancer Neg Hx   . Pancreatic cancer Neg Hx   . Colon polyps Neg Hx   . Rectal cancer Neg Hx      Prior to Admission medications   Medication Sig Start Date End Date Taking? Authorizing Provider  dronabinol (MARINOL) 2.5 MG capsule Take 1 capsule (2.5 mg total) by mouth 2 (two) times daily before a meal. 08/25/19   Truitt Merle, MD  DULoxetine (CYMBALTA) 20 MG capsule Take 1 capsule (20 mg total) by mouth daily. 08/21/19   Alla Feeling, NP  entecavir (BARACLUDE) 0.5 MG tablet TAKE 1 TABLET BY MOUTH DAILY 07/25/19   Doran Stabler, MD  Lactulose 20 GM/30ML SOLN Take 30 mLs (20 g total) by mouth every 4 (four) hours as needed (for severe constipation). 08/15/19   Truitt Merle, MD  levofloxacin (LEVAQUIN) 750 MG tablet Take 1 tablet (750 mg  total) by mouth daily. 08/15/19   Truitt Merle, MD  lidocaine-prilocaine (EMLA) cream Apply 1 application topically as needed. 08/25/19   Truitt Merle, MD  metoCLOPramide (REGLAN) 5 MG tablet Take 1 tablet (5 mg total) by mouth 4 (four) times daily -  before meals and at bedtime. 08/20/19   Tanner, Lyndon Code., PA-C  ondansetron (ZOFRAN) 8 MG tablet Take 1 tablet (8 mg total) by mouth every 8 (eight) hours as needed for nausea or vomiting. 08/20/19   Tanner, Lyndon Code., PA-C  oxyCODONE (OXY IR/ROXICODONE) 5 MG immediate release tablet Take 1 tablet (5 mg total) by mouth every 6 (six) hours as needed for severe pain. 08/26/19   Alla Feeling, NP  oxyCODONE (OXYCONTIN) 10 mg 12 hr tablet Take 1 tablet (10 mg total) by mouth every 8 (eight) hours. 08/21/19    Alla Feeling, NP  prochlorperazine (COMPAZINE) 5 MG tablet Take 1 tablet (5 mg total) by mouth every 8 (eight) hours as needed for nausea or vomiting. 08/25/19   Truitt Merle, MD  omeprazole (PRILOSEC) 20 MG capsule Take 2 x a day for 7 days then once a day until gone 06/19/19 07/04/19  Raylene Everts, MD    Physical Exam: Vitals:   08/30/19 0813  BP: (!) 128/92  Pulse: (!) 110  Resp: 18  Temp: 97.6 F (36.4 C)  TempSrc: Oral  SpO2: 98%  Weight: 54.9 kg  Height: 5\' 6"  (1.676 m)    Vitals:   08/30/19 0813  BP: (!) 128/92  Pulse: (!) 110  Resp: 18  Temp: 97.6 F (36.4 C)  TempSrc: Oral  SpO2: 98%  Weight: 54.9 kg  Height: 5\' 6"  (1.676 m)     Constitutional: NAD, calm, pleasant, soft spoken Eyes: PERRL, lids and conjunctivae normal ENMT: Mucous membranes are dry. Posterior pharynx clear of any exudate or lesions.Normal dentition.  Neck: normal, supple, no masses, no thyromegaly Respiratory: clear to auscultation bilaterally, no wheezing, no crackles. Normal respiratory effort. No accessory muscle use.  Cardiovascular: Regular rate and rhythm, no murmurs / rubs / gallops. No extremity edema. 2+ pedal pulses.  Abdomen: tender to palpation of the epigastrium and L UQ and LLQ but without guarding or rebound Musculoskeletal: no clubbing / cyanosis. No joint deformity upper and lower extremities. Normal tone ROM Skin: no rashes, lesions, ulcers. No induration Neurologic: CN 2-12 grossly intact. Strength and sensation non-focal Psychiatric: Normal judgment and insight. Alert and oriented x 3. Normal mood.    Labs on Admission: I have personally reviewed following labs and imaging studies  CBC: Recent Labs  Lab 08/25/19 0841 08/30/19 0848  WBC 9.5 11.9*  NEUTROABS 6.7 9.2*  HGB 13.2 13.2  HCT 41.0 41.0  MCV 84.5 85.4  PLT 391 0000000   Basic Metabolic Panel: Recent Labs  Lab 08/25/19 0841 08/30/19 0848  NA 133* 128*  K 4.4 4.4  CL 100 94*  CO2 23 25  GLUCOSE 103*  108*  BUN <4* 15  CREATININE 0.78 0.70  CALCIUM 9.5 11.0*   GFR: Estimated Creatinine Clearance: 96.3 mL/min (by C-G formula based on SCr of 0.7 mg/dL). Liver Function Tests: Recent Labs  Lab 08/25/19 0841 08/30/19 0848  AST 79* 124*  ALT 24 26  ALKPHOS 141* 129*  BILITOT 0.5 0.9  PROT 9.9* 10.0*  ALBUMIN 2.7* 2.9*   No results for input(s): LIPASE, AMYLASE in the last 168 hours. No results for input(s): AMMONIA in the last 168 hours. Coagulation Profile: No results for input(s):  INR, PROTIME in the last 168 hours. Cardiac Enzymes: No results for input(s): CKTOTAL, CKMB, CKMBINDEX, TROPONINI in the last 168 hours. BNP (last 3 results) No results for input(s): PROBNP in the last 8760 hours. HbA1C: No results for input(s): HGBA1C in the last 72 hours. CBG: No results for input(s): GLUCAP in the last 168 hours. Lipid Profile: No results for input(s): CHOL, HDL, LDLCALC, TRIG, CHOLHDL, LDLDIRECT in the last 72 hours. Thyroid Function Tests: No results for input(s): TSH, T4TOTAL, FREET4, T3FREE, THYROIDAB in the last 72 hours. Anemia Panel: No results for input(s): VITAMINB12, FOLATE, FERRITIN, TIBC, IRON, RETICCTPCT in the last 72 hours. Urine analysis:    Component Value Date/Time   COLORURINE YELLOW 08/13/2019 1305   APPEARANCEUR TURBID (A) 08/13/2019 1305   LABSPEC 1.014 08/13/2019 1305   PHURINE 7.0 08/13/2019 1305   GLUCOSEU NEGATIVE 08/13/2019 1305   HGBUR NEGATIVE 08/13/2019 1305   BILIRUBINUR NEGATIVE 08/13/2019 1305   KETONESUR NEGATIVE 08/13/2019 1305   PROTEINUR NEGATIVE 08/13/2019 1305   UROBILINOGEN 1.0 02/05/2018 1833   NITRITE NEGATIVE 08/13/2019 1305   LEUKOCYTESUR NEGATIVE 08/13/2019 1305    Radiological Exams on Admission: DG ABD ACUTE 2+V W 1V CHEST  Result Date: 08/30/2019 CLINICAL DATA:  Constipation, upper abdominal pain and nausea EXAM: DG ABDOMEN ACUTE W/ 1V CHEST COMPARISON:  CT abdomen pelvis from 2018, recent abdominal MRI of 2021  FINDINGS: Cardiomediastinal contours and hilar structures are normal. Lungs are clear. No free air beneath either right or left hemidiaphragm. Bony structures of the thorax are normal. Gas-filled colon with no signs of significant distension. Gas and stool in the area of the rectum. Mixture of gas and stool in the ascending colon. No abnormal calcifications. Visualized skeletal structures are normal. IMPRESSION: Gas-filled colon with scattered stool. With no signs of obstruction or free air. No acute cardiopulmonary disease. Electronically Signed   By: Zetta Bills M.D.   On: 08/30/2019 09:54     Assessment/Plan Imari Wolbeck is a 39 y.o. male with medical history significant of hepatocellular carcinoma, hepatitis B, history of non-Hodgkin lymphoma who presents with ongoing abdominal pain in setting malignancy and nausea/ vomiting  # Hypercalcemia # Refractory nausea/vomiting # Hepatocellular carcinoma # Hep B Cirrhosis -Patient follows with Dr. Burr Medico is currently on palliative immunotherapy on biologic and thus far has tolerated.  His pain regimen has been adjusted and he is currently on OxyContin 10 mg every 8 hours with oxycodone 5 mg every 4 hours for breakthrough pain.  He has been taking his bowel regimen so unclear if some of this pain and nausea vomiting may be related to opiate use.  Plain film does not reveal obstruction.   -Continue prior to admission dronabinol, oxycodone, Reglan and bowel regimen -Continue antiemetics as needed -Continue IV fluids for dehydration and hypercalcemia -Continue entecavir -Does not appear reflux is a component but could consider PPI trial -We will request palliative care consult  # Hyponatremia - suspect largely hypovolemic from poor intake, in part from cirrhosis - continue IVF and monitor  #History of T-cell non-Hodgkin's lymphoma -Status post chemotherapy, no active issues  DVT prophylaxis: SQH Code Status: Full Family Communication:  Since wife is aware Consults called: ER spoke with oncology and they are aware of his admission, will formally consult if requested Admission status: Observation   Truddie Hidden MD [facility] Pager [phone]  If 7PM-7AM, please contact night-coverage [email or web address] Password [email or web address]  08/30/2019, 11:10 AM

## 2019-08-30 NOTE — ED Notes (Signed)
EDP at bedside  

## 2019-08-30 NOTE — ED Triage Notes (Addendum)
Pt complains of upper abdominal pain, nausea, unable to keep food down. He has hx of liver cancer last month, started chemo at end of month last month. He has been taking zofran and Marinol w/o relief. No fevers, reports some hot flashes. He thinks he may be constipated as well, last BM 4 days ago. He last took IR oxycodone at 8AM, extended release at 6 AM

## 2019-08-30 NOTE — ED Provider Notes (Signed)
[facility] DEPT Provider Note   CSN: [id number] Arrival date & time: 08/30/19  0805     History Chief Complaint  Patient presents with  . Abdominal Pain  . Nausea  . cancer pt    Maurice Lowe is a 39 y.o. male.  Patient with stage IV hepatocellular cancer.  Patient complains of vomiting nausea abdominal pain.  He saw his doctor about 6 days ago and was started on Compazine and Marinol without help  The history is provided by the patient and medical records.  Abdominal Pain Pain location:  Generalized Pain quality: aching   Pain radiates to:  Does not radiate Pain severity:  Mild Onset quality:  Gradual Timing:  Constant Progression:  Unchanged Chronicity:  Recurrent Context: not alcohol use   Relieved by:  Nothing Worsened by:  Nothing Ineffective treatments:  None tried Associated symptoms: vomiting   Associated symptoms: no chest pain, no cough, no diarrhea, no fatigue and no hematuria        Past Medical History:  Diagnosis Date  . Elevated liver function tests   . Hepatic cyst   . Hepatitis   . Hepatitis B   . Non Hodgkin's lymphoma [facility])    age [age]    Patient Active Problem List   Diagnosis Date Noted  . Hepatocellular carcinoma ([facility]) 07/30/2019  . Goals of care, counseling/discussion 07/30/2019  . Personal history of non-Hodgkin lymphomas 08/04/2018  . RUQ abdominal pain 12/15/2016  . Chronic viral hepatitis B without delta agent and without coma ([place]) 12/15/2016  . Liver lesion 12/15/2016  . Elevated LFTs   . Abdominal pain, epigastric     Past Surgical History:  Procedure Laterality Date  . PORT-A-CATH REMOVAL    . PORTACATH PLACEMENT         Family History  Problem Relation Age of Onset  . Heart disease Mother   . Stroke Paternal Grandmother   . Hepatitis Neg Hx   . Liver disease Neg Hx   . Colon cancer Neg Hx   . Esophageal cancer Neg Hx   . Stomach cancer Neg Hx   . Pancreatic cancer Neg  Hx   . Colon polyps Neg Hx   . Rectal cancer Neg Hx     Social History   Tobacco Use  . Smoking status: Former Research scientist (life sciences)  . Smokeless tobacco: Never Used  Substance Use Topics  . Alcohol use: Not Currently    Comment: none since admitted to hospital 12/09/15 when starting Hep B treatment, moderate before quitting   . Drug use: Not Currently    Types: Marijuana    Comment: smoked marijuana daily from 47 to age 44, none currently     Home Medications Prior to Admission medications   Medication Sig Start Date End Date Taking? Authorizing Provider  dronabinol (MARINOL) 2.5 MG capsule Take 1 capsule (2.5 mg total) by mouth 2 (two) times daily before a meal. 08/25/19   Truitt Merle, MD  DULoxetine (CYMBALTA) 20 MG capsule Take 1 capsule (20 mg total) by mouth daily. 08/21/19   Alla Feeling, NP  entecavir (BARACLUDE) 0.5 MG tablet TAKE 1 TABLET BY MOUTH DAILY 07/25/19   Doran Stabler, MD  Lactulose 20 GM/30ML SOLN Take 30 mLs (20 g total) by mouth every 4 (four) hours as needed (for severe constipation). 08/15/19   Truitt Merle, MD  levofloxacin (LEVAQUIN) 750 MG tablet Take 1 tablet (750 mg total) by mouth daily. 08/15/19   Truitt Merle,  MD  lidocaine-prilocaine (EMLA) cream Apply 1 application topically as needed. 08/25/19   Truitt Merle, MD  metoCLOPramide (REGLAN) 5 MG tablet Take 1 tablet (5 mg total) by mouth 4 (four) times daily -  before meals and at bedtime. 08/20/19   Tanner, Lyndon Code., PA-C  ondansetron (ZOFRAN) 8 MG tablet Take 1 tablet (8 mg total) by mouth every 8 (eight) hours as needed for nausea or vomiting. 08/20/19   Tanner, Lyndon Code., PA-C  oxyCODONE (OXY IR/ROXICODONE) 5 MG immediate release tablet Take 1 tablet (5 mg total) by mouth every 6 (six) hours as needed for severe pain. 08/26/19   Alla Feeling, NP  oxyCODONE (OXYCONTIN) 10 mg 12 hr tablet Take 1 tablet (10 mg total) by mouth every 8 (eight) hours. 08/21/19   Alla Feeling, NP  prochlorperazine (COMPAZINE) 5 MG tablet Take 1 tablet (5 mg  total) by mouth every 8 (eight) hours as needed for nausea or vomiting. 08/25/19   Truitt Merle, MD  omeprazole (PRILOSEC) 20 MG capsule Take 2 x a day for 7 days then once a day until gone 06/19/19 07/04/19  Raylene Everts, MD    Allergies    Phenergan [promethazine]  Review of Systems   Review of Systems  Constitutional: Negative for appetite change and fatigue.  HENT: Negative for congestion, ear discharge and sinus pressure.   Eyes: Negative for discharge.  Respiratory: Negative for cough.   Cardiovascular: Negative for chest pain.  Gastrointestinal: Positive for abdominal pain and vomiting. Negative for diarrhea.  Genitourinary: Negative for frequency and hematuria.  Musculoskeletal: Negative for back pain.  Skin: Negative for rash.  Neurological: Negative for seizures and headaches.  Psychiatric/Behavioral: Negative for hallucinations.    Physical Exam Updated Vital Signs BP (!) 128/92 (BP Location: Left Arm)   Pulse (!) 110   Temp 97.6 F (36.4 C) (Oral)   Resp 18   Ht 5\' 6"  (1.676 m)   Wt 54.9 kg   SpO2 98%   BMI 19.53 kg/m   Physical Exam Vitals and nursing note reviewed.  Constitutional:      Appearance: He is well-developed.  HENT:     Head: Normocephalic.     Nose: Nose normal.     Mouth/Throat:     Comments: Dry mucous membrane Eyes:     General: No scleral icterus.    Conjunctiva/sclera: Conjunctivae normal.  Neck:     Thyroid: No thyromegaly.  Cardiovascular:     Rate and Rhythm: Normal rate and regular rhythm.     Heart sounds: No murmur. No friction rub. No gallop.   Pulmonary:     Breath sounds: No stridor. No wheezing or rales.  Chest:     Chest wall: No tenderness.  Abdominal:     General: There is no distension.     Tenderness: There is abdominal tenderness. There is no rebound.  Musculoskeletal:        General: Normal range of motion.     Cervical back: Neck supple.  Lymphadenopathy:     Cervical: No cervical adenopathy.  Skin:     Findings: No erythema or rash.  Neurological:     Mental Status: He is alert and oriented to person, place, and time.     Motor: No abnormal muscle tone.     Coordination: Coordination normal.  Psychiatric:        Behavior: Behavior normal.     ED Results / Procedures / Treatments   Labs (all labs ordered are  listed, but only abnormal results are displayed) Labs Reviewed  CBC WITH DIFFERENTIAL/PLATELET - Abnormal; Notable for the following components:      Result Value   WBC 11.9 (*)    Neutro Abs 9.2 (*)    Monocytes Absolute 1.3 (*)    All other components within normal limits  COMPREHENSIVE METABOLIC PANEL - Abnormal; Notable for the following components:   Sodium 128 (*)    Chloride 94 (*)    Glucose, Bld 108 (*)    Calcium 11.0 (*)    Total Protein 10.0 (*)    Albumin 2.9 (*)    AST 124 (*)    Alkaline Phosphatase 129 (*)    All other components within normal limits    EKG None  Radiology DG ABD ACUTE 2+V W 1V CHEST  Result Date: 08/30/2019 CLINICAL DATA:  Constipation, upper abdominal pain and nausea EXAM: DG ABDOMEN ACUTE W/ 1V CHEST COMPARISON:  CT abdomen pelvis from 2018, recent abdominal MRI of 2021 FINDINGS: Cardiomediastinal contours and hilar structures are normal. Lungs are clear. No free air beneath either right or left hemidiaphragm. Bony structures of the thorax are normal. Gas-filled colon with no signs of significant distension. Gas and stool in the area of the rectum. Mixture of gas and stool in the ascending colon. No abnormal calcifications. Visualized skeletal structures are normal. IMPRESSION: Gas-filled colon with scattered stool. With no signs of obstruction or free air. No acute cardiopulmonary disease. Electronically Signed   By: Zetta Bills M.D.   On: 08/30/2019 09:54    Procedures Procedures (including critical care time)  Medications Ordered in ED Medications  sodium chloride 0.9 % bolus 1,000 mL (1,000 mLs Intravenous New Bag/Given  08/30/19 0906)  prochlorperazine (COMPAZINE) injection 10 mg (10 mg Intravenous Given 08/30/19 0905)  HYDROmorphone (DILAUDID) injection 1 mg (1 mg Intravenous Given 08/30/19 0901)  dronabinol (MARINOL) capsule 2.5 mg (2.5 mg Oral Given 08/30/19 1057)    ED Course  I have reviewed the triage vital signs and the nursing notes.  Pertinent labs & imaging results that were available during my care of the patient were reviewed by me and considered in my medical decision making (see chart for details).    CRITICAL CARE Performed by: Milton Ferguson Total critical care time: 35 minutes Critical care time was exclusive of separately billable procedures and treating other patients. Critical care was necessary to treat or prevent imminent or life-threatening deterioration. Critical care was time spent personally by me on the following activities: development of treatment plan with patient and/or surrogate as well as nursing, discussions with consultants, evaluation of patient's response to treatment, examination of patient, obtaining history from patient or surrogate, ordering and performing treatments and interventions, ordering and review of laboratory studies, ordering and review of radiographic studies, pulse oximetry and re-evaluation of patient's condition.  MDM Rules/Calculators/A&P                      Patient with dehydration and nausea and hypercalcemia.  I spoke with oncology and they agree with admission to hospitalist and hydration.  Oncology will see the patient only if they are formally consulted by medicine   This patient presents to the ED for concern of abdominal pain and vomiting, this involves an extensive number of treatment options, and is a complaint that carries with it a high risk of complications and morbidity.  The differential diagnosis includes gastritis.  Symptoms from his stage IV cancer   Lab Tests:  I Ordered, reviewed, and interpreted labs, which included CBC  chemistries which showed \\hypercalcemia   hyponatremia and leukocytosis.  Medicines ordered:   I ordered medication fluids and Compazine for nausea  Imaging Studies ordered:   I ordered imaging studies which included plain abdominal film and I independently visualized and interpreted imaging which showed unremarkable Additional history obtained:   Additional history obtained from records  Previous records obtained and reviewed   Consultations Obtained:   I consulted oncology and discussed lab and imaging findings   it was decided to admit him for hydration  Reevaluation:  After the interventions stated above, I reevaluated the patient and found patient improved some with IV hydration and Compazine.  Along with pain medicine.  He will still be admitted for hydration  Critical Interventions:  .   Final Clinical Impression(s) / ED Diagnoses Final diagnoses:  Hypercalcemia    Rx / DC Orders ED Discharge Orders    None       Milton Ferguson, MD 08/30/19 1116

## 2019-08-31 DIAGNOSIS — R1084 Generalized abdominal pain: Secondary | ICD-10-CM | POA: Diagnosis present

## 2019-08-31 DIAGNOSIS — E860 Dehydration: Secondary | ICD-10-CM | POA: Diagnosis present

## 2019-08-31 DIAGNOSIS — K5900 Constipation, unspecified: Secondary | ICD-10-CM | POA: Diagnosis not present

## 2019-08-31 DIAGNOSIS — D649 Anemia, unspecified: Secondary | ICD-10-CM | POA: Diagnosis present

## 2019-08-31 DIAGNOSIS — F05 Delirium due to known physiological condition: Secondary | ICD-10-CM | POA: Diagnosis not present

## 2019-08-31 DIAGNOSIS — E872 Acidosis: Secondary | ICD-10-CM | POA: Diagnosis not present

## 2019-08-31 DIAGNOSIS — Z681 Body mass index (BMI) 19 or less, adult: Secondary | ICD-10-CM | POA: Diagnosis not present

## 2019-08-31 DIAGNOSIS — C220 Liver cell carcinoma: Secondary | ICD-10-CM | POA: Diagnosis present

## 2019-08-31 DIAGNOSIS — Z9221 Personal history of antineoplastic chemotherapy: Secondary | ICD-10-CM | POA: Diagnosis not present

## 2019-08-31 DIAGNOSIS — Z20822 Contact with and (suspected) exposure to covid-19: Secondary | ICD-10-CM | POA: Diagnosis present

## 2019-08-31 DIAGNOSIS — G893 Neoplasm related pain (acute) (chronic): Secondary | ICD-10-CM | POA: Diagnosis not present

## 2019-08-31 DIAGNOSIS — K7460 Unspecified cirrhosis of liver: Secondary | ICD-10-CM | POA: Diagnosis not present

## 2019-08-31 DIAGNOSIS — K7200 Acute and subacute hepatic failure without coma: Secondary | ICD-10-CM | POA: Diagnosis not present

## 2019-08-31 DIAGNOSIS — R112 Nausea with vomiting, unspecified: Secondary | ICD-10-CM | POA: Diagnosis not present

## 2019-08-31 DIAGNOSIS — Z66 Do not resuscitate: Secondary | ICD-10-CM | POA: Diagnosis not present

## 2019-08-31 DIAGNOSIS — Z515 Encounter for palliative care: Secondary | ICD-10-CM | POA: Diagnosis not present

## 2019-08-31 DIAGNOSIS — Z8572 Personal history of non-Hodgkin lymphomas: Secondary | ICD-10-CM | POA: Diagnosis not present

## 2019-08-31 DIAGNOSIS — B181 Chronic viral hepatitis B without delta-agent: Secondary | ICD-10-CM | POA: Diagnosis present

## 2019-08-31 DIAGNOSIS — R627 Adult failure to thrive: Secondary | ICD-10-CM | POA: Diagnosis present

## 2019-08-31 DIAGNOSIS — E43 Unspecified severe protein-calorie malnutrition: Secondary | ICD-10-CM | POA: Diagnosis present

## 2019-08-31 DIAGNOSIS — R64 Cachexia: Secondary | ICD-10-CM | POA: Diagnosis present

## 2019-08-31 DIAGNOSIS — Z7189 Other specified counseling: Secondary | ICD-10-CM | POA: Diagnosis not present

## 2019-08-31 DIAGNOSIS — B1910 Unspecified viral hepatitis B without hepatic coma: Secondary | ICD-10-CM

## 2019-08-31 DIAGNOSIS — R531 Weakness: Secondary | ICD-10-CM | POA: Diagnosis not present

## 2019-08-31 DIAGNOSIS — R1011 Right upper quadrant pain: Secondary | ICD-10-CM | POA: Diagnosis not present

## 2019-08-31 DIAGNOSIS — C772 Secondary and unspecified malignant neoplasm of intra-abdominal lymph nodes: Secondary | ICD-10-CM | POA: Diagnosis present

## 2019-08-31 DIAGNOSIS — E871 Hypo-osmolality and hyponatremia: Secondary | ICD-10-CM | POA: Diagnosis present

## 2019-08-31 DIAGNOSIS — T402X5A Adverse effect of other opioids, initial encounter: Secondary | ICD-10-CM | POA: Diagnosis present

## 2019-08-31 DIAGNOSIS — G9341 Metabolic encephalopathy: Secondary | ICD-10-CM | POA: Diagnosis not present

## 2019-08-31 DIAGNOSIS — G8929 Other chronic pain: Secondary | ICD-10-CM | POA: Diagnosis not present

## 2019-08-31 DIAGNOSIS — R1013 Epigastric pain: Secondary | ICD-10-CM | POA: Diagnosis not present

## 2019-08-31 DIAGNOSIS — K5903 Drug induced constipation: Secondary | ICD-10-CM | POA: Diagnosis present

## 2019-08-31 LAB — URINALYSIS, ROUTINE W REFLEX MICROSCOPIC
Bilirubin Urine: NEGATIVE
Glucose, UA: NEGATIVE mg/dL
Hgb urine dipstick: NEGATIVE
Ketones, ur: 5 mg/dL — AB
Leukocytes,Ua: NEGATIVE
Nitrite: NEGATIVE
Protein, ur: NEGATIVE mg/dL
Specific Gravity, Urine: 1.017 (ref 1.005–1.030)
pH: 7 (ref 5.0–8.0)

## 2019-08-31 LAB — COMPREHENSIVE METABOLIC PANEL
ALT: 26 U/L (ref 0–44)
AST: 143 U/L — ABNORMAL HIGH (ref 15–41)
Albumin: 2.3 g/dL — ABNORMAL LOW (ref 3.5–5.0)
Alkaline Phosphatase: 116 U/L (ref 38–126)
Anion gap: 7 (ref 5–15)
BUN: 10 mg/dL (ref 6–20)
CO2: 21 mmol/L — ABNORMAL LOW (ref 22–32)
Calcium: 9.6 mg/dL (ref 8.9–10.3)
Chloride: 101 mmol/L (ref 98–111)
Creatinine, Ser: 0.53 mg/dL — ABNORMAL LOW (ref 0.61–1.24)
GFR calc Af Amer: >60 mL/min (ref >60)
GFR calc non Af Amer: >60 mL/min (ref >60)
Glucose, Bld: 87 mg/dL (ref 70–99)
Potassium: 4.4 mmol/L (ref 3.5–5.1)
Sodium: 129 mmol/L — ABNORMAL LOW (ref 135–145)
Total Bilirubin: 1.1 mg/dL (ref 0.3–1.2)
Total Protein: 8 g/dL (ref 6.5–8.1)

## 2019-08-31 LAB — CBC WITH DIFFERENTIAL/PLATELET
Abs Immature Granulocytes: 0.1 10*3/uL — ABNORMAL HIGH (ref 0.00–0.07)
Basophils Absolute: 0 10*3/uL (ref 0.0–0.1)
Basophils Relative: 0 %
Eosinophils Absolute: 0 10*3/uL (ref 0.0–0.5)
Eosinophils Relative: 0 %
HCT: 36.5 % — ABNORMAL LOW (ref 39.0–52.0)
Hemoglobin: 11.6 g/dL — ABNORMAL LOW (ref 13.0–17.0)
Immature Granulocytes: 1 %
Lymphocytes Relative: 9 %
Lymphs Abs: 1.5 10*3/uL (ref 0.7–4.0)
MCH: 27.5 pg (ref 26.0–34.0)
MCHC: 31.8 g/dL (ref 30.0–36.0)
MCV: 86.5 fL (ref 80.0–100.0)
Monocytes Absolute: 1.8 10*3/uL — ABNORMAL HIGH (ref 0.1–1.0)
Monocytes Relative: 11 %
Neutro Abs: 13.3 10*3/uL — ABNORMAL HIGH (ref 1.7–7.7)
Neutrophils Relative %: 79 %
Platelets: 287 10*3/uL (ref 150–400)
RBC: 4.22 MIL/uL (ref 4.22–5.81)
RDW: 13.9 % (ref 11.5–15.5)
WBC: 16.7 10*3/uL — ABNORMAL HIGH (ref 4.0–10.5)
nRBC: 0 % (ref 0.0–0.2)

## 2019-08-31 LAB — CBC
HCT: 36.6 % — ABNORMAL LOW (ref 39.0–52.0)
Hemoglobin: 11.7 g/dL — ABNORMAL LOW (ref 13.0–17.0)
MCH: 27.3 pg (ref 26.0–34.0)
MCHC: 32 g/dL (ref 30.0–36.0)
MCV: 85.5 fL (ref 80.0–100.0)
Platelets: 275 10*3/uL (ref 150–400)
RBC: 4.28 MIL/uL (ref 4.22–5.81)
RDW: 14.1 % (ref 11.5–15.5)
WBC: 18.5 10*3/uL — ABNORMAL HIGH (ref 4.0–10.5)
nRBC: 0 % (ref 0.0–0.2)

## 2019-08-31 LAB — SARS CORONAVIRUS 2 (TAT 6-24 HRS): SARS Coronavirus 2: NEGATIVE

## 2019-08-31 LAB — HIV ANTIBODY (ROUTINE TESTING W REFLEX): HIV Screen 4th Generation wRfx: NONREACTIVE

## 2019-08-31 MED ORDER — HEPARIN SODIUM (PORCINE) 5000 UNIT/ML IJ SOLN
5000.0000 [IU] | Freq: Two times a day (BID) | INTRAMUSCULAR | Status: DC
  Administered 2019-09-01 – 2019-09-08 (×13): 5000 [IU] via SUBCUTANEOUS
  Filled 2019-08-31 (×12): qty 1

## 2019-08-31 MED ORDER — HYDROMORPHONE HCL 2 MG/ML IJ SOLN
2.0000 mg | Freq: Once | INTRAMUSCULAR | Status: AC
  Administered 2019-08-31: 2 mg via INTRAVENOUS
  Filled 2019-08-31: qty 1

## 2019-08-31 MED ORDER — SENNOSIDES-DOCUSATE SODIUM 8.6-50 MG PO TABS
1.0000 | ORAL_TABLET | Freq: Two times a day (BID) | ORAL | Status: DC
  Administered 2019-08-31 – 2019-09-03 (×7): 1 via ORAL
  Filled 2019-08-31 (×8): qty 1

## 2019-08-31 MED ORDER — CEFAZOLIN SODIUM-DEXTROSE 2-4 GM/100ML-% IV SOLN
2.0000 g | INTRAVENOUS | Status: AC
  Filled 2019-08-31: qty 100

## 2019-08-31 MED ORDER — BISACODYL 10 MG RE SUPP: 10.0000 mg | Freq: Every day | RECTAL | Status: DC | PRN

## 2019-08-31 MED ORDER — POLYETHYLENE GLYCOL 3350 17 G PO PACK
17.0000 g | PACK | Freq: Two times a day (BID) | ORAL | Status: DC
  Administered 2019-08-31 – 2019-09-08 (×11): 17 g via ORAL
  Filled 2019-08-31 (×13): qty 1

## 2019-08-31 NOTE — Progress Notes (Signed)
PROGRESS NOTE    Yon Portillo  [id number] DOB: 1980-09-03 DOA: 08/30/2019 PCP: Patient, No Pcp Per   Brief Narrative:  HPI per Dr. Truddie Hidden on 08/30/19 Lillian Benne is a 39 y.o. male with medical history significant of hepatocellular carcinoma, hepatitis B, history of non-Hodgkin lymphoma who presents with ongoing abdominal pain in setting malignancy and nausea/ vomiting.  Patient reports he has been dealing with pain for quite a while now and his oncology team has been titrating his pain medications but he still maintains that he has severe epigastric pain at times and on occasion to his left side.  He states that for the past 1 week he has been mostly sedentary because movement exacerbates the pain and he has to be somewhat of an incline position to tolerate.  He reports he is also not had any real food for over a week now and has been vomiting every 4 hours.  He denies any fevers, chills but reports that he has occasional hot flashes.  He does not report any particular trigger to this episode.  It is unclear if he filled his duloxetine and or some of the pain medication changes from his last visit were implemented.  He does report constipation and reports his last bowel movement was 4 days ago but also has not eaten much in over a week.  He denies any chest pain, palpitations, shortness of breath.  Patient lives at home with wife and 3 children  **Interim History  States that his nausea is a little bit better and is tolerating a clear liquid diet.  Was in significant pain per nursing this a.m. but was fine on my examination.  Still not had a bowel movement in 6 days.  Notify Dr. Burr Medico will see the patient in the a.m.  Assessment & Plan:   Active Problems:   Hypercalcemia  Hypercalcemia Refractory nausea/vomiting Hepatocellular carcinoma Hep B Cirrhosis -Patient follows with Dr. Burr Medico is currently on palliative immunotherapy on biologic and thus far has tolerated.    -His pain regimen has been adjusted and he is currently on OxyContin 10 mg every 8 hours with oxycodone 5 mg every 4 hours for breakthrough pain.   -He has been taking his bowel regimen so unclear if some of this pain and nausea vomiting may be related to opiate use.  Plain film does not reveal obstruction has not had a bowel movement in 6 days.   -Continue prior to admission dronabinol, oxycodone, Reglan and bowel regimen -Continue antiemetics as needed -Was placed on a clear liquid diet and will go to a full liquid diet today -Continue IV fluids for dehydration and hypercalcemia -Continue entecavir -Does not appear reflux is a component but could consider PPI trial  -Continue with antiemetics -Dr. Burr Medico to see the patient tomorrow -Patient is to get a port placed -I spoke with Dr. Domingo Cocking and patient's pain was actually a little bit better today so we will hold off on palliative consultation and tomorrow  Constipation -Continue with bowel regimen with senna docusate, MiraLAX as well as bisacodyl suppositories  Metabolic Acidosis  -Mild with a CO2 of 21, anion gap of 7, and a chloride level of 101  Hyponatremia -Suspect largely hypovolemic from poor intake and Nausea/Vomiting and  in part from cirrhosis -Continue IVF -Monitor and trend -Repeat CMP in a.m.  History of T-cell non-Hodgkin's lymphoma -Status post chemotherapy, no active issues  Leukocytosis -Likely reactive in the setting of his cancer in from dehydration Currently  no signs and symptoms of infection is afebrile next-check blood cultures x2 -Check urinalysis as well as urine culture -Chest x-ray is clear -KUB showed Gas-filled colon with scattered stool. With no signs of obstruction or free air -Continue to monitor for signs and symptoms of infection; currently no infection noted Patient's WBC trended up to 18.5 is now 16.7 -Repeat CBC in a.m.  Normocytic Anemia -Patient is hemoglobin/hematocrit is now 11.6/36.5  -Check anemia panel in the a.m. -Continue to monitor for signs and symptoms of bleeding; currently no overt bleeding noted -Repeat CBC in a.m.  DVT prophylaxis: Subcu heparin Code Status: FULL CODE  Family Communication: Discussed with wife at bedside Disposition Plan: Pending further improvement of his nausea vomiting and abdominal pain and is to tolerate p.o. diet prior to safe discharge disposition  Consultants:   Medical Oncology  Palliative Care Medicine  Interventional Radiology    Procedures: Port-A-Cath placement to be done in the morning  Antimicrobials:  Anti-infectives (From admission, onward)   Start     Dose/Rate Route Frequency Ordered Stop   09/01/19 1500  ceFAZolin (ANCEF) IVPB 2g/100 mL premix     2 g 200 mL/hr over 30 Minutes Intravenous To Radiology 08/31/19 1826 09/02/19 1500   08/31/19 1000  entecavir (BARACLUDE) tablet 0.5 mg     0.5 mg Oral Daily 08/30/19 1226       Subjective: Seen and examined at bedside and states that he was still hurting but not as much and states that he is not as nauseous.  Tolerated clear liquid diet.  No chest pain, lightheadedness or dizziness.  No other concerns or complaints at this time.  Objective: Vitals:   08/31/19 0020 08/31/19 0239 08/31/19 0448 08/31/19 1401  BP: (!) 143/98  (!) 139/97 129/90  Pulse: (!) 108  (!) 106 (!) 103  Resp: 16 18 17 15   Temp: 98.8 F (37.1 C)  97.9 F (36.6 C) 98.1 F (36.7 C)  TempSrc: Oral  Oral Oral  SpO2: 98%  97% 97%  Weight:      Height:        Intake/Output Summary (Last 24 hours) at 08/31/2019 1708 Last data filed at 08/31/2019 1000 Gross per 24 hour  Intake 2240 ml  Output 600 ml  Net 1640 ml   Filed Weights   08/30/19 0813  Weight: 54.9 kg   Examination: Physical Exam:  Constitutional: Thin African-American male who is chronically ill-appearing and currently in no acute distress appears calm Eyes: Lids and conjunctivae normal, sclerae anicteric  ENMT: External  Ears, Nose appear normal. Grossly normal hearing.  Neck: Appears normal, supple, no cervical masses, normal ROM, no appreciable thyromegaly; no JVD Respiratory: Diminished to auscultation bilaterally, no wheezing, rales, rhonchi or crackles. Normal respiratory effort and patient is not tachypenic. No accessory muscle use.  Unlabored breathing Cardiovascular: RRR, no murmurs / rubs / gallops. S1 and S2 auscultated.  Abdomen: Soft, tender to palpate, non-distended. Bowel sounds positive.  GU: Deferred. Musculoskeletal: No clubbing / cyanosis of digits/nails. No joint deformity upper and lower extremities.  Skin: No rashes, lesions, ulcers on limited skin evaluation. No induration; Warm and dry.  Neurologic: CN 2-12 grossly intact with no focal deficits. Romberg sign and erebellar reflexes not assessed.  Psychiatric: Normal judgment and insight. Alert and oriented x 3. Normal mood and appropriate affect.   Data Reviewed: I have personally reviewed following labs and imaging studies  CBC: Recent Labs  Lab 08/25/19 0841 08/30/19 0848 08/31/19 0607  WBC 9.5 11.9*  18.5*  NEUTROABS 6.7 9.2*  --   HGB 13.2 13.2 11.7*  HCT 41.0 41.0 36.6*  MCV 84.5 85.4 85.5  PLT 391 333 [id number]   Basic Metabolic Panel: Recent Labs  Lab 08/25/19 0841 08/30/19 0848 08/31/19 0607  NA 133* 128* 129*  K 4.4 4.4 4.4  CL 100 94* 101  CO2 23 25 21*  GLUCOSE 103* 108* 87  BUN <4* 15 10  CREATININE 0.78 0.70 0.53*  CALCIUM 9.5 11.0* 9.6   GFR: Estimated Creatinine Clearance: 96.3 mL/min (A) (by C-G formula based on SCr of 0.53 mg/dL (L)). Liver Function Tests: Recent Labs  Lab 08/25/19 0841 08/30/19 0848 08/31/19 0607  AST 79* 124* 143*  ALT 24 26 26   ALKPHOS 141* 129* 116  BILITOT 0.5 0.9 1.1  PROT 9.9* 10.0* 8.0  ALBUMIN 2.7* 2.9* 2.3*   No results for input(s): LIPASE, AMYLASE in the last 168 hours. No results for input(s): AMMONIA in the last 168 hours. Coagulation Profile: No results for  input(s): INR, PROTIME in the last 168 hours. Cardiac Enzymes: No results for input(s): CKTOTAL, CKMB, CKMBINDEX, TROPONINI in the last 168 hours. BNP (last 3 results) No results for input(s): PROBNP in the last 8760 hours. HbA1C: No results for input(s): HGBA1C in the last 72 hours. CBG: No results for input(s): GLUCAP in the last 168 hours. Lipid Profile: No results for input(s): CHOL, HDL, LDLCALC, TRIG, CHOLHDL, LDLDIRECT in the last 72 hours. Thyroid Function Tests: No results for input(s): TSH, T4TOTAL, FREET4, T3FREE, THYROIDAB in the last 72 hours. Anemia Panel: No results for input(s): VITAMINB12, FOLATE, FERRITIN, TIBC, IRON, RETICCTPCT in the last 72 hours. Sepsis Labs: No results for input(s): PROCALCITON, LATICACIDVEN in the last 168 hours.  Recent Results (from the past 240 hour(s))  SARS CORONAVIRUS 2 (TAT 6-24 HRS) Nasopharyngeal Nasopharyngeal Swab     Status: None   Collection Time: 08/30/19  5:02 PM   Specimen: Nasopharyngeal Swab  Result Value Ref Range Status   SARS Coronavirus 2 NEGATIVE NEGATIVE Final    Comment: (NOTE) SARS-CoV-2 target nucleic acids are NOT DETECTED. The SARS-CoV-2 RNA is generally detectable in upper and lower respiratory specimens during the acute phase of infection. Negative results do not preclude SARS-CoV-2 infection, do not rule out co-infections with other pathogens, and should not be used as the sole basis for treatment or other patient management decisions. Negative results must be combined with clinical observations, patient history, and epidemiological information. The expected result is Negative. Fact Sheet for Patients: SugarRoll.be Fact Sheet for Healthcare Providers: https://www.woods-mathews.com/ This test is not yet approved or cleared by the [place] FDA and  has been authorized for detection and/or diagnosis of SARS-CoV-2 by FDA under an Emergency Use Authorization (EUA).  This EUA will remain  in effect (meaning this test can be used) for the duration of the COVID-19 declaration under Section 56 4(b)(1) of the Act, 21 U.S.C. section 360bbb-3(b)(1), unless the authorization is terminated or revoked sooner. Performed at [facility], [place] [address] [place], [place] [address]      RN Pressure Injury Documentation:     Estimated body mass index is 19.53 kg/m as calculated from the following:   Height as of this encounter: 5\' 6"  (1.676 m).   Weight as of this encounter: 54.9 kg.  Malnutrition Type:      Malnutrition Characteristics:      Nutrition Interventions:     Radiology Studies: DG ABD ACUTE 2+V W 1V CHEST  Result  Date: 08/30/2019 CLINICAL DATA:  Constipation, upper abdominal pain and nausea EXAM: DG ABDOMEN ACUTE W/ 1V CHEST COMPARISON:  CT abdomen pelvis from 2018, recent abdominal MRI of 2021 FINDINGS: Cardiomediastinal contours and hilar structures are normal. Lungs are clear. No free air beneath either right or left hemidiaphragm. Bony structures of the thorax are normal. Gas-filled colon with no signs of significant distension. Gas and stool in the area of the rectum. Mixture of gas and stool in the ascending colon. No abnormal calcifications. Visualized skeletal structures are normal. IMPRESSION: Gas-filled colon with scattered stool. With no signs of obstruction or free air. No acute cardiopulmonary disease. Electronically Signed   By: Zetta Bills M.D.   On: 08/30/2019 09:54   Scheduled Meds: . dronabinol  2.5 mg Oral BID AC  . DULoxetine  20 mg Oral Daily  . entecavir  0.5 mg Oral Daily  . heparin  5,000 Units Subcutaneous Q12H  . metoCLOPramide  5 mg Oral TID AC & HS  . oxyCODONE  10 mg Oral Q8H  . polyethylene glycol  17 g Oral BID  . senna-docusate  1 tablet Oral BID   Continuous Infusions: . sodium chloride 75 mL/hr at 08/31/19 1143    LOS: 0 days   Kerney Elbe, DO [facility] PAGER is on  AMION  If 7PM-7AM, please contact night-coverage [email or web address]

## 2019-08-31 NOTE — Consult Note (Signed)
Chief Complaint: Chemotherapy access  Referring Physician(s): Dr. Claybon Jabs  Supervising Physician: Arne Cleveland  Patient Status: [facility] - In-pt  History of Present Illness: Maurice Lowe is a 39 y.o. male   Past Medical History:  Diagnosis Date  . Elevated liver function tests   . Hepatic cyst   . Hepatitis   . Hepatitis B   . Non Hodgkin's lymphoma [facility])    age [age]    Past Surgical History:  Procedure Laterality Date  . PORT-A-CATH REMOVAL    . PORTACATH PLACEMENT      Allergies: Phenergan [promethazine]  Medications: Prior to Admission medications   Medication Sig Start Date End Date Taking? Authorizing Provider  dronabinol (MARINOL) 2.5 MG capsule Take 1 capsule (2.5 mg total) by mouth 2 (two) times daily before a meal. 08/25/19  Yes Truitt Merle, MD  entecavir (BARACLUDE) 0.5 MG tablet TAKE 1 TABLET BY MOUTH DAILY Patient taking differently: Take 0.5 mg by mouth daily.  07/25/19  Yes Danis, Kirke Corin, MD  Lactulose 20 GM/30ML SOLN Take 30 mLs (20 g total) by mouth every 4 (four) hours as needed (for severe constipation). 08/15/19  Yes Truitt Merle, MD  lidocaine-prilocaine (EMLA) cream Apply 1 application topically as needed. Patient taking differently: Apply 1 application topically as needed (local anesthesia).  08/25/19  Yes Truitt Merle, MD  ondansetron (ZOFRAN) 8 MG tablet Take 1 tablet (8 mg total) by mouth every 8 (eight) hours as needed for nausea or vomiting. 08/20/19  Yes Tanner, Lyndon Code., PA-C  oxyCODONE (OXY IR/ROXICODONE) 5 MG immediate release tablet Take 1 tablet (5 mg total) by mouth every 6 (six) hours as needed for severe pain. Patient taking differently: Take 5-10 mg by mouth every 6 (six) hours as needed for severe pain.  08/26/19  Yes Alla Feeling, NP  oxyCODONE (OXYCONTIN) 10 mg 12 hr tablet Take 1 tablet (10 mg total) by mouth every 8 (eight) hours. 08/21/19  Yes Alla Feeling, NP  DULoxetine (CYMBALTA) 20 MG capsule Take 1 capsule (20 mg total)  by mouth daily. 08/21/19   Alla Feeling, NP  levofloxacin (LEVAQUIN) 750 MG tablet Take 1 tablet (750 mg total) by mouth daily. Patient not taking: Reported on 08/30/2019 08/15/19   Truitt Merle, MD  metoCLOPramide (REGLAN) 5 MG tablet Take 1 tablet (5 mg total) by mouth 4 (four) times daily -  before meals and at bedtime. Patient not taking: Reported on 08/30/2019 08/20/19   Harle Stanford., PA-C  prochlorperazine (COMPAZINE) 5 MG tablet Take 1 tablet (5 mg total) by mouth every 8 (eight) hours as needed for nausea or vomiting. 08/25/19   Truitt Merle, MD  omeprazole (PRILOSEC) 20 MG capsule Take 2 x a day for 7 days then once a day until gone 06/19/19 07/04/19  Raylene Everts, MD     Family History  Problem Relation Age of Onset  . Heart disease Mother   . Stroke Paternal Grandmother   . Hepatitis Neg Hx   . Liver disease Neg Hx   . Colon cancer Neg Hx   . Esophageal cancer Neg Hx   . Stomach cancer Neg Hx   . Pancreatic cancer Neg Hx   . Colon polyps Neg Hx   . Rectal cancer Neg Hx     Social History   Socioeconomic History  . Marital status: Married    Spouse name: Not on file  . Number of children: 3  . Years of education: Not  on file  . Highest education level: Not on file  Occupational History  . Occupation: help desk IT  Tobacco Use  . Smoking status: Former Research scientist (life sciences)  . Smokeless tobacco: Never Used  Substance and Sexual Activity  . Alcohol use: Not Currently    Comment: none since admitted to hospital 12/09/15 when starting Hep B treatment, moderate before quitting   . Drug use: Not Currently    Types: Marijuana    Comment: smoked marijuana daily from 51 to age 22, none currently   . Sexual activity: Not on file  Other Topics Concern  . Not on file  Social History Narrative  . Not on file   Social Determinants of Health   Financial Resource Strain:   . Difficulty of Paying Living Expenses:   Food Insecurity:   . Worried About Charity fundraiser in the Last Year:   .  Arboriculturist in the Last Year:   Transportation Needs:   . Film/video editor (Medical):   [name] Lack of Transportation (Non-Medical):   Physical Activity:   . Days of Exercise per Week:   . Minutes of Exercise per Session:   Stress:   . Feeling of Stress :   Social Connections:   . Frequency of Communication with Friends and Family:   . Frequency of Social Gatherings with Friends and Family:   . Attends Religious Services:   . Active Member of Clubs or Organizations:   . Attends Archivist Meetings:   [name] Marital Status:    Review of Systems: A 12 point ROS discussed and pertinent positives are indicated in the HPI above.  All other systems are negative.  Review of Systems  Constitutional: Positive for fatigue. Negative for fever.  HENT: Negative for congestion.   Respiratory: Negative for cough and shortness of breath.   Cardiovascular: Negative for chest pain.  Gastrointestinal: Positive for abdominal pain.  Neurological: Negative for headaches.  Psychiatric/Behavioral: Negative for behavioral problems and confusion.    Vital Signs: BP 129/90 (BP Location: Right Arm)   Pulse (!) 103   Temp 98.1 F (36.7 C) (Oral)   Resp 15   Ht 5\' 6"  (1.676 m)   Wt 121 lb (54.9 kg)   SpO2 97%   BMI 19.53 kg/m   Physical Exam Vitals and nursing note reviewed.  Constitutional:      Appearance: He is well-developed.  HENT:     Head: Normocephalic.  Pulmonary:     Effort: Pulmonary effort is normal.  Musculoskeletal:        General: Normal range of motion.     Cervical back: Normal range of motion.  Skin:    General: Skin is dry.  Neurological:     Mental Status: He is alert and oriented to person, place, and time.     Imaging: DG Chest 2 View  Result Date: 08/21/2019 CLINICAL DATA:  Hepatocellular carcinoma.  Chest pain. EXAM: CHEST - 2 VIEW COMPARISON:  02/05/2018 and PET-CT 08/08/2019 FINDINGS: Slightly prominent interstitial markings in the left hilum are  chronic. Stable sclerosis involving the anterior right fifth rib based on previous chest radiograph and prior PET-CT. No significant airspace disease or lung consolidation. Heart and mediastinum are within normal limits. No large pleural effusions. IMPRESSION: No active cardiopulmonary disease. Electronically Signed   By: Markus Daft M.D.   On: 08/21/2019 10:14   NM PET Image Initial (PI) Skull Base To Thigh  Result Date: 08/08/2019 CLINICAL DATA:  Initial  treatment strategy for hepatocellular carcinoma. Chronic hepatitis B. EXAM: NUCLEAR MEDICINE PET SKULL BASE TO THIGH TECHNIQUE: 6.4 mCi F-18 FDG was injected intravenously. Full-ring PET imaging was performed from the skull base to thigh after the radiotracer. CT data was obtained and used for attenuation correction and anatomic localization. Fasting blood glucose: 104 mg/dl COMPARISON:  Abdominal MRI 07/25/2019 FINDINGS: Mediastinal blood pool activity: SUV max 1.91 Liver activity: SUV max 2.4 NECK: No hypermetabolic lymph nodes in the neck. Symmetric hypermetabolic activity in the lingual tonsils is favored physiologic. Incidental CT findings: none CHEST: Single small hypermetabolic RIGHT lower paratracheal lymph node with SUV max equal 3.6. This lymph node measures only 5 mm short axis (image 63/4). Hypermetabolic nodule anterior to the RIGHT ventricle in the precordial space with SUV max equal 16.3. Nodule measures 12 mm (image 89/4) No suspicious pulmonary nodules. Incidental CT findings: none ABDOMEN/PELVIS: Large hypermetabolic infiltrative mass involving the near entirety of the LEFT hepatic lobe. Confluent hypermetabolic infiltrative mass with SUV max equal 15.7. There is more discrete individual lesions in the RIGHT hepatic lobe dome SUV max equal 9.4. Enlarged hypermetabolic periportal lymph nodes. These are difficult to define on the noncontrast CT but measures approximately 1.3 cm (image 144/4) with SUV max 17.9. Hypermetabolic lymph node position  between the aorta and IVC is only seen on the PET imaging with SUV max equal 4.5. No abnormality the pancreas identified. No discrete pelvic lymphadenopathy. Incidental CT findings: none SKELETON: No focal hypermetabolic activity to suggest skeletal metastasis. Incidental CT findings: none IMPRESSION: 1. Intensely hypermetabolic infiltrative mass occupying the near entirety of the LEFT hepatic lobe consistent with primary hepatic malignancy. 2. Individual discrete hypermetabolic nodules within the superior aspect of the RIGHT hepatic lobe. 3. Hypermetabolic metastatic adenopathy in the periportal lymph nodes and periaortic lymph nodes upper abdomen. 4. Hypermetabolic lymph node in the precordial space and a RIGHT lower paratracheal lymph node. Electronically Signed   By: Suzy Bouchard M.D.   On: 08/08/2019 10:32   US BIOPSY (LIVER)  Result Date: 08/11/2019 INDICATION: 39 year old with history of hepatitis-B and infiltrative lesion involving the left hepatic lobe. Tissue diagnosis is needed. Remote history of non-Hodgkin's lymphoma. EXAM: ULTRASOUND-GUIDED LIVER LESION BIOPSY MEDICATIONS: None. ANESTHESIA/SEDATION: Moderate (conscious) sedation was employed during this procedure. A total of Versed 2.0 mg and Fentanyl 100 mcg was administered intravenously. Moderate Sedation Time: 10 minutes. The patient's level of consciousness and vital signs were monitored continuously by radiology nursing throughout the procedure under my direct supervision. FLUOROSCOPY TIME:  None COMPLICATIONS: None immediate. PROCEDURE: Informed written consent was obtained from the patient after a thorough discussion of the procedural risks, benefits and alternatives. All questions were addressed. Maximal Sterile Barrier Technique was utilized including caps, mask, sterile gowns, sterile gloves, sterile drape, hand hygiene and skin antiseptic. A timeout was performed prior to the initiation of the procedure. Liver was evaluated with  ultrasound. Anterior abdomen was prepped with chlorhexidine and sterile field was created. Skin and soft tissues were anesthetized with 1% lidocaine. Using ultrasound guidance, 17 gauge coaxial needle was directed into the left hepatic lobe. Total of 3 core biopsies were obtained with an 18 gauge core device. Specimens placed in formalin. Gel-Foam slurry was injected through the 17 gauge needle as it was removed. Bandage placed over the puncture site. FINDINGS: Left hepatic lobe is diffusely heterogeneous and evidence for poorly defined hypoechoic lesions. Three core biopsies were obtained in this area of heterogeneity. No significant bleeding or hematoma formation following the core biopsies. IMPRESSION:  Ultrasound-guided core biopsies of the left hepatic lobe and infiltrative lesion. Electronically Signed   By: Markus Daft M.D.   On: 08/11/2019 14:58   DG ABD ACUTE 2+V W 1V CHEST  Result Date: 08/30/2019 CLINICAL DATA:  Constipation, upper abdominal pain and nausea EXAM: DG ABDOMEN ACUTE W/ 1V CHEST COMPARISON:  CT abdomen pelvis from 2018, recent abdominal MRI of 2021 FINDINGS: Cardiomediastinal contours and hilar structures are normal. Lungs are clear. No free air beneath either right or left hemidiaphragm. Bony structures of the thorax are normal. Gas-filled colon with no signs of significant distension. Gas and stool in the area of the rectum. Mixture of gas and stool in the ascending colon. No abnormal calcifications. Visualized skeletal structures are normal. IMPRESSION: Gas-filled colon with scattered stool. With no signs of obstruction or free air. No acute cardiopulmonary disease. Electronically Signed   By: Zetta Bills M.D.   On: 08/30/2019 09:54    Labs:  CBC: Recent Labs    08/20/19 1115 08/25/19 0841 08/30/19 0848 08/31/19 0607  WBC 9.6 9.5 11.9* 18.5*  HGB 13.5 13.2 13.2 11.7*  HCT 41.6 41.0 41.0 36.6*  PLT 342 391 333 275    COAGS: Recent Labs    07/18/19 1518  08/11/19 1146  INR 1.3* 1.1    BMP: Recent Labs    08/20/19 1115 08/25/19 0841 08/30/19 0848 08/31/19 0607  NA 135 133* 128* 129*  K 4.3 4.4 4.4 4.4  CL 101 100 94* 101  CO2 20* 23 25 21*  GLUCOSE 87 103* 108* 87  BUN 5* <4* 15 10  CALCIUM 8.0* 9.5 11.0* 9.6  CREATININE 0.82 0.78 0.70 0.53*  GFRNONAA >60 >60 >60 >60  GFRAA >60 >60 >60 >60    LIVER FUNCTION TESTS: Recent Labs    08/20/19 1115 08/25/19 0841 08/30/19 0848 08/31/19 0607  BILITOT 0.4 0.5 0.9 1.1  AST 73* 79* 124* 143*  ALT 20 24 26 26   ALKPHOS 139* 141* 129* 116  PROT 9.8* 9.9* 10.0* 8.0  ALBUMIN 2.9* 2.7* 2.9* 2.3*    TUMOR MARKERS: Recent Labs    03/26/19 0859 07/18/19 1557  AFPTM 5.8 116.9*    Assessment and Plan:  39 y.o, male inpatient. History of non hodgkin lyphoma, Hep B   [facility] admitted for abdominal pain, vomiting and nasuea.Patient previously scheduled for portacath placement as an OP on 4.12.21. As patient condition is slowing improving with no current signs of infection  the Team would like for IR to proceed with portacath placement as previously scheduled.    Pertinent Imaging 3.19.21 - PET shows RIJ is accessible  Pertinent IR History 3.22.21 - liver biopsy   Pertinent Allergies Phenergan   WBC is 18.5 All other  labs are within acceptable parameters. Patient is on subcutaneous prophylactic dose of lovenox. Patient is afebrile.  Team instructed to: Keep Patient to be NPO after midnight Hold prophylactic anticoagulation 24 hours prior to scheduled procedure.   IR will call patient when ready.   Risks and benefits of image guided port-a-catheter placement was discussed with the patient including, but not limited to bleeding, infection, pneumothorax, or fibrin sheath development and need for additional procedures.  All of the patient's questions were answered, patient is agreeable to proceed. Consent signed and in chart.    Thank you for this interesting consult.  I  greatly enjoyed meeting Whitton Hellwig and look forward to participating in their care.  A copy of this report was sent to the requesting provider  on this date.  Electronically Signed: Avel Peace, NP 08/31/2019, 3:39 PM   I spent a total of 40 Minutes   in face to face in clinical consultation, greater than 50% of which was counseling/coordinating care for portacath placement

## 2019-08-31 NOTE — Progress Notes (Signed)
Palliative care consult received.    Discussed case with Dr. Alfredia Ferguson today.  Mr. Maurice Lowe is reported to be feeling better symptomatically.  Therefore, we will hold on consult today and consider palliative involvement based upon continued clinical course.  Micheline Rough, MD [place] [facility] Team [phone]  NO CHARGE NOTE

## 2019-09-01 ENCOUNTER — Ambulatory Visit (HOSPITAL_COMMUNITY): Payer: BC Managed Care – PPO

## 2019-09-01 ENCOUNTER — Inpatient Hospital Stay (HOSPITAL_COMMUNITY): Payer: BC Managed Care – PPO

## 2019-09-01 ENCOUNTER — Inpatient Hospital Stay (HOSPITAL_COMMUNITY): Admission: RE | Admit: 2019-09-01 | Payer: BC Managed Care – PPO | Source: Ambulatory Visit

## 2019-09-01 DIAGNOSIS — R4182 Altered mental status, unspecified: Secondary | ICD-10-CM | POA: Diagnosis not present

## 2019-09-01 DIAGNOSIS — K7460 Unspecified cirrhosis of liver: Secondary | ICD-10-CM | POA: Diagnosis not present

## 2019-09-01 DIAGNOSIS — C220 Liver cell carcinoma: Secondary | ICD-10-CM

## 2019-09-01 DIAGNOSIS — R1011 Right upper quadrant pain: Secondary | ICD-10-CM | POA: Diagnosis not present

## 2019-09-01 DIAGNOSIS — R1084 Generalized abdominal pain: Secondary | ICD-10-CM | POA: Diagnosis not present

## 2019-09-01 DIAGNOSIS — R112 Nausea with vomiting, unspecified: Secondary | ICD-10-CM | POA: Diagnosis not present

## 2019-09-01 DIAGNOSIS — K5900 Constipation, unspecified: Secondary | ICD-10-CM | POA: Diagnosis not present

## 2019-09-01 DIAGNOSIS — Z5111 Encounter for antineoplastic chemotherapy: Secondary | ICD-10-CM | POA: Diagnosis not present

## 2019-09-01 HISTORY — PX: IR IMAGING GUIDED PORT INSERTION: IMG5740

## 2019-09-01 LAB — CBC WITH DIFFERENTIAL/PLATELET
Abs Immature Granulocytes: 0.14 10*3/uL — ABNORMAL HIGH (ref 0.00–0.07)
Basophils Absolute: 0 10*3/uL (ref 0.0–0.1)
Basophils Relative: 0 %
Eosinophils Absolute: 0 10*3/uL (ref 0.0–0.5)
Eosinophils Relative: 0 %
HCT: 34.7 % — ABNORMAL LOW (ref 39.0–52.0)
Hemoglobin: 11.1 g/dL — ABNORMAL LOW (ref 13.0–17.0)
Immature Granulocytes: 1 %
Lymphocytes Relative: 9 %
Lymphs Abs: 1.5 10*3/uL (ref 0.7–4.0)
MCH: 27.3 pg (ref 26.0–34.0)
MCHC: 32 g/dL (ref 30.0–36.0)
MCV: 85.3 fL (ref 80.0–100.0)
Monocytes Absolute: 1.6 10*3/uL — ABNORMAL HIGH (ref 0.1–1.0)
Monocytes Relative: 10 %
Neutro Abs: 13.5 10*3/uL — ABNORMAL HIGH (ref 1.7–7.7)
Neutrophils Relative %: 80 %
Platelets: 267 10*3/uL (ref 150–400)
RBC: 4.07 MIL/uL — ABNORMAL LOW (ref 4.22–5.81)
RDW: 14.1 % (ref 11.5–15.5)
WBC: 16.7 10*3/uL — ABNORMAL HIGH (ref 4.0–10.5)
nRBC: 0 % (ref 0.0–0.2)

## 2019-09-01 LAB — COMPREHENSIVE METABOLIC PANEL
ALT: 25 U/L (ref 0–44)
AST: 111 U/L — ABNORMAL HIGH (ref 15–41)
Albumin: 2.4 g/dL — ABNORMAL LOW (ref 3.5–5.0)
Alkaline Phosphatase: 109 U/L (ref 38–126)
Anion gap: 12 (ref 5–15)
BUN: 11 mg/dL (ref 6–20)
CO2: 19 mmol/L — ABNORMAL LOW (ref 22–32)
Calcium: 10.1 mg/dL (ref 8.9–10.3)
Chloride: 101 mmol/L (ref 98–111)
Creatinine, Ser: 0.56 mg/dL — ABNORMAL LOW (ref 0.61–1.24)
GFR calc Af Amer: >60 mL/min (ref >60)
GFR calc non Af Amer: >60 mL/min (ref >60)
Glucose, Bld: 83 mg/dL (ref 70–99)
Potassium: 4.2 mmol/L (ref 3.5–5.1)
Sodium: 132 mmol/L — ABNORMAL LOW (ref 135–145)
Total Bilirubin: 1 mg/dL (ref 0.3–1.2)
Total Protein: 8 g/dL (ref 6.5–8.1)

## 2019-09-01 LAB — MAGNESIUM: Magnesium: 2.2 mg/dL (ref 1.7–2.4)

## 2019-09-01 LAB — PROTIME-INR
INR: 1.4 — ABNORMAL HIGH (ref 0.8–1.2)
Prothrombin Time: 16.9 seconds — ABNORMAL HIGH (ref 11.4–15.2)

## 2019-09-01 LAB — PHOSPHORUS: Phosphorus: 1.1 mg/dL — ABNORMAL LOW (ref 2.5–4.6)

## 2019-09-01 MED ORDER — ADULT MULTIVITAMIN W/MINERALS CH
1.0000 | ORAL_TABLET | Freq: Every day | ORAL | Status: DC
  Administered 2019-09-01 – 2019-09-08 (×7): 1 via ORAL
  Filled 2019-09-01 (×7): qty 1

## 2019-09-01 MED ORDER — LIDOCAINE-EPINEPHRINE (PF) 1 %-1:200000 IJ SOLN
INTRAMUSCULAR | Status: AC | PRN
  Administered 2019-09-01: 10 mL

## 2019-09-01 MED ORDER — LIDOCAINE-EPINEPHRINE 1 %-1:100000 IJ SOLN
INTRAMUSCULAR | Status: AC
  Filled 2019-09-01: qty 1

## 2019-09-01 MED ORDER — HEPARIN SOD (PORK) LOCK FLUSH 100 UNIT/ML IV SOLN
INTRAVENOUS | Status: AC
  Filled 2019-09-01: qty 5

## 2019-09-01 MED ORDER — MIDAZOLAM HCL 2 MG/2ML IJ SOLN
INTRAMUSCULAR | Status: AC
  Filled 2019-09-01: qty 6

## 2019-09-01 MED ORDER — FLEET ENEMA 7-19 GM/118ML RE ENEM
1.0000 | ENEMA | Freq: Once | RECTAL | Status: AC
  Administered 2019-09-01: 1 via RECTAL
  Filled 2019-09-01: qty 1

## 2019-09-01 MED ORDER — FENTANYL CITRATE (PF) 100 MCG/2ML IJ SOLN
INTRAMUSCULAR | Status: AC
  Filled 2019-09-01: qty 4

## 2019-09-01 MED ORDER — BISACODYL 10 MG RE SUPP: 10.0000 mg | Freq: Every day | RECTAL | Status: DC | PRN

## 2019-09-01 MED ORDER — FENTANYL CITRATE (PF) 100 MCG/2ML IJ SOLN
INTRAMUSCULAR | Status: AC | PRN
  Administered 2019-09-01 (×2): 50 ug via INTRAVENOUS

## 2019-09-01 MED ORDER — MIDAZOLAM HCL 2 MG/2ML IJ SOLN
INTRAMUSCULAR | Status: AC | PRN
  Administered 2019-09-01 (×2): 1 mg via INTRAVENOUS

## 2019-09-01 MED ORDER — SODIUM PHOSPHATES 45 MMOLE/15ML IV SOLN
30.0000 mmol | Freq: Once | INTRAVENOUS | Status: AC
  Administered 2019-09-01: 30 mmol via INTRAVENOUS
  Filled 2019-09-01 (×2): qty 10

## 2019-09-01 MED ORDER — KATE FARMS STANDARD 1.4 PO LIQD
325.0000 mL | Freq: Two times a day (BID) | ORAL | Status: DC
  Administered 2019-09-01 – 2019-09-08 (×10): 325 mL via ORAL
  Filled 2019-09-01 (×17): qty 325

## 2019-09-01 MED ORDER — POTASSIUM PHOSPHATES 15 MMOLE/5ML IV SOLN
30.0000 mmol | Freq: Once | INTRAVENOUS | Status: DC
  Filled 2019-09-01: qty 10

## 2019-09-01 MED ORDER — CEFAZOLIN SODIUM-DEXTROSE 2-4 GM/100ML-% IV SOLN
INTRAVENOUS | Status: AC
  Administered 2019-09-01: 2 g via INTRAVENOUS
  Filled 2019-09-01: qty 100

## 2019-09-01 NOTE — Progress Notes (Signed)
Discussed case with primary hospitalist, Dr. Alfredia Ferguson.  Plan to hold on palliative consult at this time.  We will therefore sign off.  Please reconsult if we can be of care in the assistance of Maurice Lowe moving forward.  Micheline Rough, MD [place] Team [phone]  No charge note

## 2019-09-01 NOTE — Progress Notes (Addendum)
HEMATOLOGY-ONCOLOGY PROGRESS NOTE  SUBJECTIVE: Presented to the ER with nausea, vomiting, and abdominal pain. Admission labs showed a WBC of 11.9, ANC 9.2, sodium 128, calcium 11.0, Total protein of 10.0, albumin 2.9, AST 124, and AP 129. Hypercalcemia has responded to IVF. Has received Zometa as an outpatient on 08/13/2019. Started cycle 1 of Tecentriq and Avastin on 08/15/2019.  The patient reports ongoing abdominal pain.  Pain is diffuse but primarily located over his liver.  Reports that nausea and vomiting have now resolved.  He has been able to tolerate some clear liquids.  He is currently n.p.o. for Port-A-Cath placement by IR.  Reports constipation and states he has not had a bowel movement in about 6 to 7 days.  Reported an episode last evening when he was talking to people in the room that were not there.  This has now resolved.  Oncology History  Hepatocellular carcinoma ([place])  07/25/2019 Imaging   MRI Abdomen 07/25/19 IMPRESSION: 1. Large diffusely infiltrative mass centered in the left lobe of the liver, with indeterminate but very aggressive imaging characteristics, highly concerning for infiltrative tumor considered likely hepatocellular carcinoma.   07/30/2019 Initial Diagnosis   Hepatocellular carcinoma ([place])   08/08/2019 PET scan   PET 08/08/19 IMPRESSION: 1. Intensely hypermetabolic infiltrative mass occupying the near entirety of the LEFT hepatic lobe consistent with primary hepatic malignancy. 2. Individual discrete hypermetabolic nodules within the superior aspect of the RIGHT hepatic lobe. 3. Hypermetabolic metastatic adenopathy in the periportal lymph nodes and periaortic lymph nodes upper abdomen. 4. Hypermetabolic lymph node in the precordial space and a RIGHT lower paratracheal lymph node.   08/11/2019 Initial Biopsy   FINAL MICROSCOPIC DIAGNOSIS: 08/11/19  A. LIVER, LEFT HEPATIC LOBE, BIOPSY:  - Poorly differentiated carcinoma.  - See comment.   COMMENT:  The  poorly differentiated carcinoma is positive with cytokeratin 7 and  cytokeratin 20 and there is variable positivity with HepPar 1,  Glypican-3, polyclonal CEA and MOC31.  The tumor is negative with  arginase, CDX2, cytokeratin 5/6, TTF-1 and Napsin-A.  The  immunophenotype is not specific but favors poorly differentiated  hepatocellular carcinoma.  The differential diagnosis however includes  poorly differentiated cholangiocarcinoma.    08/14/2019 Procedure   Upper Endoscopy by Dr Loletha Carrow 08/14/19 IMPRESSION - Normal esophagus. - Normal stomach. - Normal examined duodenum. - No specimens collected.  Colonoscopy  IMPRESSION - The entire examined colon is normal on direct and retroflexion views. - No specimens collected.    08/15/2019 -  Chemotherapy   First LineTecentriq and Avastin q3weeks starting 08/15/19      REVIEW OF SYSTEMS:   Constitutional: Denies fevers, chills or abnormal weight loss Respiratory: Denies cough, dyspnea or wheezes Cardiovascular: Denies palpitation, chest discomfort Gastrointestinal: Nausea and vomiting have resolved.  Reports abdominal pain and constipation. Skin: Denies abnormal skin rashes Neurological:Denies numbness, tingling or new weaknesses Behavioral/Psych: Mood is stable, no new changes  Extremities: No lower extremity edema All other systems were reviewed with the patient and are negative.  I have reviewed the past medical history, past surgical history, social history and family history with the patient and they are unchanged from previous note.   PHYSICAL EXAMINATION: ECOG PERFORMANCE STATUS: 2 - Symptomatic, <50% confined to bed  Vitals:   09/01/19 0240 09/01/19 0615  BP: 136/84 (!) 132/94  Pulse: (!) 107 (!) 102  Resp: 16 16  Temp: 98.2 F (36.8 C) 97.8 F (36.6 C)  SpO2: 96% 97%   Filed Weights   08/30/19 0813  Weight: 54.9 kg    Intake/Output from previous day: 04/11 0701 - 04/12 0700 In: 720 [P.O.:720] Out: 850  [Urine:850]  GENERAL:alert, no distress and comfortable SKIN: skin color, texture, turgor are normal, no rashes or significant lesions LUNGS: clear to auscultation and percussion with normal breathing effort HEART: regular rate & rhythm and no murmurs and no lower extremity edema ABDOMEN: Soft, with mild distention and pain over the right upper quadrant NEURO: alert & oriented x 3 with fluent speech, no focal motor/sensory deficits  LABORATORY DATA:  I have reviewed the data as listed CMP Latest Ref Rng & Units 09/01/2019 08/31/2019 08/30/2019  Glucose 70 - 99 mg/dL 83 87 108(H)  BUN 6 - 20 mg/dL 11 10 15   Creatinine 0.61 - 1.24 mg/dL 0.56(L) 0.53(L) 0.70  Sodium 135 - 145 mmol/L 132(L) 129(L) 128(L)  Potassium 3.5 - 5.1 mmol/L 4.2 4.4 4.4  Chloride 98 - 111 mmol/L 101 101 94(L)  CO2 22 - 32 mmol/L 19(L) 21(L) 25  Calcium 8.9 - 10.3 mg/dL 10.1 9.6 11.0(H)  Total Protein 6.5 - 8.1 g/dL 8.0 8.0 10.0(H)  Total Bilirubin 0.3 - 1.2 mg/dL 1.0 1.1 0.9  Alkaline Phos 38 - 126 U/L 109 116 129(H)  AST 15 - 41 U/L 111(H) 143(H) 124(H)  ALT 0 - 44 U/L 25 26 26     Lab Results  Component Value Date   WBC 16.7 (H) 09/01/2019   HGB 11.1 (L) 09/01/2019   HCT 34.7 (L) 09/01/2019   MCV 85.3 09/01/2019   PLT 267 09/01/2019   NEUTROABS 13.5 (H) 09/01/2019    DG Chest 2 View  Result Date: 08/21/2019 CLINICAL DATA:  Hepatocellular carcinoma.  Chest pain. EXAM: CHEST - 2 VIEW COMPARISON:  02/05/2018 and PET-CT 08/08/2019 FINDINGS: Slightly prominent interstitial markings in the left hilum are chronic. Stable sclerosis involving the anterior right fifth rib based on previous chest radiograph and prior PET-CT. No significant airspace disease or lung consolidation. Heart and mediastinum are within normal limits. No large pleural effusions. IMPRESSION: No active cardiopulmonary disease. Electronically Signed   By: Markus Daft M.D.   On: 08/21/2019 10:14   NM PET Image Initial (PI) Skull Base To  Thigh  Result Date: 08/08/2019 CLINICAL DATA:  Initial treatment strategy for hepatocellular carcinoma. Chronic hepatitis B. EXAM: NUCLEAR MEDICINE PET SKULL BASE TO THIGH TECHNIQUE: 6.4 mCi F-18 FDG was injected intravenously. Full-ring PET imaging was performed from the skull base to thigh after the radiotracer. CT data was obtained and used for attenuation correction and anatomic localization. Fasting blood glucose: 104 mg/dl COMPARISON:  Abdominal MRI 07/25/2019 FINDINGS: Mediastinal blood pool activity: SUV max 1.91 Liver activity: SUV max 2.4 NECK: No hypermetabolic lymph nodes in the neck. Symmetric hypermetabolic activity in the lingual tonsils is favored physiologic. Incidental CT findings: none CHEST: Single small hypermetabolic RIGHT lower paratracheal lymph node with SUV max equal 3.6. This lymph node measures only 5 mm short axis (image 63/4). Hypermetabolic nodule anterior to the RIGHT ventricle in the precordial space with SUV max equal 16.3. Nodule measures 12 mm (image 89/4) No suspicious pulmonary nodules. Incidental CT findings: none ABDOMEN/PELVIS: Large hypermetabolic infiltrative mass involving the near entirety of the LEFT hepatic lobe. Confluent hypermetabolic infiltrative mass with SUV max equal 15.7. There is more discrete individual lesions in the RIGHT hepatic lobe dome SUV max equal 9.4. Enlarged hypermetabolic periportal lymph nodes. These are difficult to define on the noncontrast CT but measures approximately 1.3 cm (image 144/4) with SUV max 17.9. Hypermetabolic  lymph node position between the aorta and IVC is only seen on the PET imaging with SUV max equal 4.5. No abnormality the pancreas identified. No discrete pelvic lymphadenopathy. Incidental CT findings: none SKELETON: No focal hypermetabolic activity to suggest skeletal metastasis. Incidental CT findings: none IMPRESSION: 1. Intensely hypermetabolic infiltrative mass occupying the near entirety of the LEFT hepatic lobe  consistent with primary hepatic malignancy. 2. Individual discrete hypermetabolic nodules within the superior aspect of the RIGHT hepatic lobe. 3. Hypermetabolic metastatic adenopathy in the periportal lymph nodes and periaortic lymph nodes upper abdomen. 4. Hypermetabolic lymph node in the precordial space and a RIGHT lower paratracheal lymph node. Electronically Signed   By: Suzy Bouchard M.D.   On: 08/08/2019 10:32   US BIOPSY (LIVER)  Result Date: 08/11/2019 INDICATION: 39 year old with history of hepatitis-B and infiltrative lesion involving the left hepatic lobe. Tissue diagnosis is needed. Remote history of non-Hodgkin's lymphoma. EXAM: ULTRASOUND-GUIDED LIVER LESION BIOPSY MEDICATIONS: None. ANESTHESIA/SEDATION: Moderate (conscious) sedation was employed during this procedure. A total of Versed 2.0 mg and Fentanyl 100 mcg was administered intravenously. Moderate Sedation Time: 10 minutes. The patient's level of consciousness and vital signs were monitored continuously by radiology nursing throughout the procedure under my direct supervision. FLUOROSCOPY TIME:  None COMPLICATIONS: None immediate. PROCEDURE: Informed written consent was obtained from the patient after a thorough discussion of the procedural risks, benefits and alternatives. All questions were addressed. Maximal Sterile Barrier Technique was utilized including caps, mask, sterile gowns, sterile gloves, sterile drape, hand hygiene and skin antiseptic. A timeout was performed prior to the initiation of the procedure. Liver was evaluated with ultrasound. Anterior abdomen was prepped with chlorhexidine and sterile field was created. Skin and soft tissues were anesthetized with 1% lidocaine. Using ultrasound guidance, 17 gauge coaxial needle was directed into the left hepatic lobe. Total of 3 core biopsies were obtained with an 18 gauge core device. Specimens placed in formalin. Gel-Foam slurry was injected through the 17 gauge needle as it  was removed. Bandage placed over the puncture site. FINDINGS: Left hepatic lobe is diffusely heterogeneous and evidence for poorly defined hypoechoic lesions. Three core biopsies were obtained in this area of heterogeneity. No significant bleeding or hematoma formation following the core biopsies. IMPRESSION: Ultrasound-guided core biopsies of the left hepatic lobe and infiltrative lesion. Electronically Signed   By: Markus Daft M.D.   On: 08/11/2019 14:58   DG ABD ACUTE 2+V W 1V CHEST  Result Date: 08/30/2019 CLINICAL DATA:  Constipation, upper abdominal pain and nausea EXAM: DG ABDOMEN ACUTE W/ 1V CHEST COMPARISON:  CT abdomen pelvis from 2018, recent abdominal MRI of 2021 FINDINGS: Cardiomediastinal contours and hilar structures are normal. Lungs are clear. No free air beneath either right or left hemidiaphragm. Bony structures of the thorax are normal. Gas-filled colon with no signs of significant distension. Gas and stool in the area of the rectum. Mixture of gas and stool in the ascending colon. No abnormal calcifications. Visualized skeletal structures are normal. IMPRESSION: Gas-filled colon with scattered stool. With no signs of obstruction or free air. No acute cardiopulmonary disease. Electronically Signed   By: Zetta Bills M.D.   On: 08/30/2019 09:54    ASSESSMENT AND PLAN: 1.  Metastatic hepatocellular carcinoma 2.  Abdominal pain secondary to [facility] and constipation 3.  Constipation 4.  Hypercalcemia of malignancy, improved 5.  Leukocytosis 6.  Mild anemia 7.  Anorexia and weight loss 8.  Cirrhosis 7.  Hepatitis B  -Tolerated first cycle of Tecentriq and  Avastin moderately well.  He is scheduled for a second cycle on 09/04/2019.  Will need to delay if he is still admitted.  Proceed with Port-A-Cath placement today by IR. -Pain seems to be fairly well controlled at this time with OxyContin, oxycodone for breakthrough pain, and hydromorphone for breakthrough pain.  Continue current  medications.  May need to increase dose of OxyContin if still requiring a lot of breakthrough pain medication. -The patient has constipation likely due to pain medication usage.  Continue MiraLAX, Senokot-S, lactulose as needed, and Dulcolax suppository as needed.  I do not see that he has been getting the lactulose or Dulcolax.  I will schedule lactulose and Dulcolax suppository for later today after his Port-A-Cath has been placed. -Hypercalcemia has improved with IV fluids.  Continue to monitor closely. -The patient has leukocytosis which is slowly trending downward.  Blood cultures are negative to date and he remains afebrile.  Suspect this could be reactive. -Anemia remained stable.  No transfusion is indicated. -Continue Marinol for anorexia and weight loss.  Recommend dietitian consult. -Continue entecavir for hep B treatment.   LOS: 1 day   Mikey Bussing, DNP, AGPCNP-BC, AOCNP 09/01/19  Addendum  I have seen the patient, examined him. I agree with the assessment and and plan and have edited the notes.   Pt's had CT head due to his hallucination and confusion, which came back negative. I reviewed with pt. He has not had BM for 6-7 days, probably related to low oral intake and narcotics. I will give one dose enema today, and let him start using lactulose every 4 hours until he has BM tonight, he is agreeable. If still no BM by tomorrow, please consider relistor. Continue other supportive care, I appreciate the excellent care from hospitalist team. Will f/u.  Truitt Merle  09/01/2019 6pm

## 2019-09-01 NOTE — Procedures (Signed)
Pre Procedure Dx: HCC Post Procedural Dx: Same  Successful placement of right IJ approach port-a-cath with tip at the superior caval atrial junction. The catheter is ready for immediate use.  Estimated Blood Loss: Minimal  Complications: None immediate.  Jay Therman Hughlett, MD Pager #: [phone]   

## 2019-09-01 NOTE — Progress Notes (Signed)
Initial Nutrition Assessment  DOCUMENTATION CODES:   (unable to assess for malnutrition at this time.)  INTERVENTION:  - diet re-advancement as medically feasible. - will order [employer] BID, each supplement provides 455 kcal and 20 grams protein. - will order daily multivitamin with minerals. - will perform NFPE at follow-up.    NUTRITION DIAGNOSIS:   Increased nutrient needs related to chronic illness, cancer and cancer related treatments as evidenced by estimated needs.  GOAL:   Patient will meet greater than or equal to 90% of their needs  MONITOR:   PO intake, Supplement acceptance, Labs, Weight trends  REASON FOR ASSESSMENT:   Malnutrition Screening Tool, Consult Assessment of nutrition requirement/status  ASSESSMENT:   39 y.o. male with medical history of hepatocellular carcinoma, hepatitis B, and hx of non-Hodgkin lymphoma. He presented to the ED with ongoing abdominal pain in the setting of malignancy and N/V. His Oncology team has been titrating pain medication d/t ongoing abdominal pain which is severe at times. He reported being mostly sedentary x1 week d/t movement exacerbating abdominal pain. He also reported vomiting about every 4 hours. He has been having ongoing constipation and reported last BM was 4 days PTA.  Patient was out of the room to CT during RD visit. His wife was in the room and all communication was had with her. He was on CLD at the time of admission and advanced to [place] yesterday at 1053 and then changed to NPO at midnight. No intakes documented while on diet.   Wife reports that patient has had ongoing issues with constipation despite laxative and stool softener use. Because of this, he has been eating and drinking very little. He continues to have abdominal pain, which is very severe at times, despite pain medication. He has not had any chewing or swallowing issues and wife denies any taste changes. He has had heightened sense of smell and he  become nauseated with the smell of nearly all personal hygiene products (unclear if this happens with any foods).   His wife has been going to the grocery store 2-3 times each week to buy things that sound good to patient at the moment and to buy new items (specifically fruits) to try. Patient is not open to drinking smoothies except every once in awhile. He does like vanilla and blueberry greek yogurt.   We discussed lower fiber foods, pushing fluids and high fluid-containing foods, and talked with her about [employer] and the benefit it could provide. Wife is very interested in having this supplement ordered for patient.  Per chart review, weight on 4/10 was 121 lb and weight on 3/17 was 128 lb. This indicates 7 lb weight loss (5.5% body weight) in the past 1 month; significant for time frame.  Suspect some degree of malnutrition but do not feel confident stating degree without completing NFPE.   Labs reviewed; Na: 132 mmol/l, creatinine: 0.56 mg/dl, Phos: 1.1 mg/dl, AST elevated. Medications reviewed; 2.5 mg marinol BID, 5 mg reglan TID, 1 packet miralax BID, 1 tablet senokot BID, 30 mmol IV NaPhos x1 run 4/12. IVF; NS @ 75 ml/hr.     NUTRITION - FOCUSED PHYSICAL EXAM:  unable to complete as patient was out of the room for the entirety of RD visit.   Diet Order:   Diet Order            Diet NPO time specified Except for: Sips with Meds  Diet effective midnight  EDUCATION NEEDS:   Education needs have been addressed  Skin:  Skin Assessment: Reviewed RN Assessment  Last BM:  PTA/unknown  Height:   Ht Readings from Last 1 Encounters:  08/30/19 5\' 6"  (1.676 m)    Weight:   Wt Readings from Last 1 Encounters:  08/30/19 54.9 kg    Ideal Body Weight:  64.5 kg  BMI:  Body mass index is 19.53 kg/m.  Estimated Nutritional Needs:   Kcal:  [id number] kcal  Protein:  105-120 grams  Fluid:  >/= 2.3 L/day     Jarome Matin, MS, RD, LDN,  CNSC Inpatient Clinical Dietitian RD pager # available in [facility]  After hours/weekend pager # available in [facility]

## 2019-09-01 NOTE — Progress Notes (Signed)
Patient went yellow MEWS no acute change. Patient stable. Will continue to monitor.   Melrose Nakayama 09/01/2019,12:46 AM

## 2019-09-01 NOTE — Progress Notes (Signed)
PROGRESS NOTE    Maurice Lowe  EUM:[id number] DOB: 1980-06-16 DOA: 08/30/2019 PCP: Patient, No Pcp Per   Brief Narrative:  HPI per Dr. Truddie Hidden on 08/30/19 Maurice Lowe is a 39 y.o. male with medical history significant of hepatocellular carcinoma, hepatitis B, history of non-Hodgkin lymphoma who presents with ongoing abdominal pain in setting malignancy and nausea/ vomiting.  Patient reports he has been dealing with pain for quite a while now and his oncology team has been titrating his pain medications but he still maintains that he has severe epigastric pain at times and on occasion to his left side.  He states that for the past 1 week he has been mostly sedentary because movement exacerbates the pain and he has to be somewhat of an incline position to tolerate.  He reports he is also not had any real food for over a week now and has been vomiting every 4 hours.  He denies any fevers, chills but reports that he has occasional hot flashes.  He does not report any particular trigger to this episode.  It is unclear if he filled his duloxetine and or some of the pain medication changes from his last visit were implemented.  He does report constipation and reports his last bowel movement was 4 days ago but also has not eaten much in over a week.  He denies any chest pain, palpitations, shortness of breath.  Patient lives at home with wife and 3 children  **Interim History  States that his nausea is a little bit better and is tolerating a clear liquid diet.  Was in significant pain per nursing yesterday a.m. Still not had a bowel movement in 7days.  Patient is to go for port placement.  Has been hallucinating and was delirious overnight and will obtain a CT scan of the head.  Medical oncology consulted his pain is fairly well controlled and they have scheduled lactulose and Dulcolax suppositories for later today.  Continue IV fluid hydration  Assessment & Plan:   Active  Problems:   Hypercalcemia  Hypercalcemia Refractory nausea/vomiting Hepatocellular carcinoma Hep B Cirrhosis -Patient follows with Dr. Burr Medico is currently on palliative immunotherapy on biologic and thus far has tolerated.  Tolerated first cycle of Tecentriq and Avastin moderately well -His pain regimen has been adjusted and he is currently on OxyContin 10 mg every 8 hours with oxycodone 5 mg every 4 hours for breakthrough pain.   -He has been taking his bowel regimen so unclear if some of this pain and nausea vomiting may be related to opiate use.  Plain film does not reveal obstruction has not had a bowel movement in 6 days.   -Continue prior to admission dronabinol, oxycodone, Reglan and bowel regimen -Continue antiemetics as needed -Was placed on a clear liquid diet and will go to a full liquid diet today and has been tolerating that -Continue IV fluids for dehydration and hypercalcemia -Continue Entecavir -Does not appear reflux is a component but could consider PPI trial  -Continue with antiemetics -Patient is to get a port placed  -I spoke with Dr. Domingo Cocking and patient's pain was actually a little bit better today so we will hold off on palliative consultation have discontinued this given that his pain is fairly well controlled at this time the OxyContin and oxycodone for breakthrough pain and hydromorphone -Oncology is following and appreciate their assistance -Dietitian recommending diet advancement and ordering Dillard Essex twice daily along with daily multivitamin with minerals  Constipation -Continue  with bowel regimen with senna docusate, MiraLAX as well as bisacodyl suppositories -Medical oncology is scheduling lactulose as well as Dulcolax  Metabolic Acidosis  -Mild with a CO2 of 21, anion gap of 7, and a chloride level of 101 and now his CO2 is 19, chloride level is 101, anion gap is 12   Hyponatremia -Suspect largely hypovolemic from poor intake and Nausea/Vomiting and  in  part from cirrhosis -Continue IVF and is improving with a sodium 132 -Monitor and trend -Repeat CMP in a.m.  History of T-cell non-Hodgkin's lymphoma -Status post chemotherapy, no active issues  Leukocytosis, improving slightly -Likely reactive in the setting of his cancer in from dehydration Currently no signs and symptoms of infection is afebrile next -check blood cultures x2 and showed no growth to date -Check urinalysis as well as urine culture; urinalysis was unremarkable -Chest x-ray is clear -KUB showed Gas-filled colon with scattered stool. With no signs of obstruction or free air -Continue to monitor for signs and symptoms of infection; currently no infection noted Patient's WBC trended up to 18.5 is now 16.7 -Repeat CBC in a.m.  Normocytic Anemia -Patient is hemoglobin/hematocrit is now 11.1/34.7 -Check anemia panel in the a.m. -Continue to monitor for signs and symptoms of bleeding; currently no overt bleeding noted -Repeat CBC in a.m.  Hypophosphatemia  -Patient's phosphorus level is 1.1  -Replete with IV K-Phos 30 mmol -Continue monitor and replete as necessary -Repeat phosphorus level in a.m.  Confusion and Hallucinations -Could be related pain medications -Hypercalcemia is improving -Check head CT without contrast showed no acute intracranial abnormality -We will need to discuss with oncology -Continue with delirium precautions -Follow-up on cultures  DVT prophylaxis: Subcu heparin Code Status: FULL CODE  Family Communication: Discussed with mother at bedside Disposition Plan: Pending further improvement of his nausea vomiting and abdominal pain and is to tolerate p.o. diet prior to safe discharge disposition  Consultants:   Medical Oncology  Palliative Care Medicine  Interventional Radiology    Procedures: Port-A-Cath placement to be done in the morning  Antimicrobials:  Anti-infectives (From admission, onward)   Start     Dose/Rate Route  Frequency Ordered Stop   09/01/19 1500  ceFAZolin (ANCEF) IVPB 2g/100 mL premix     2 g 200 mL/hr over 30 Minutes Intravenous To Radiology 08/31/19 1826 09/01/19 1240   08/31/19 1000  entecavir (BARACLUDE) tablet 0.5 mg     0.5 mg Oral Daily 08/30/19 1226       Subjective: Seen and examined at bedside and states that his pain was doing okay today but he was very concerned given that he became delirious last night and started hallucinating speaking to people that were not there.  No nausea or vomiting.  She has not had a bowel movement yet.  No other concerns or complaints at this time..  Objective: Vitals:   09/01/19 1230 09/01/19 1235 09/01/19 1240 09/01/19 1350  BP: (!) 128/96 133/88 136/89 (!) 120/91  Pulse: 99 (!) 108 (!) 107 (!) 102  Resp: 20 16 (!) 24 20  Temp:    98 F (36.7 C)  TempSrc:    Oral  SpO2: 100% 100% 100%   Weight:      Height:        Intake/Output Summary (Last 24 hours) at 09/01/2019 1920 Last data filed at 09/01/2019 1723 Gross per 24 hour  Intake 2650.74 ml  Output 800 ml  Net 1850.74 ml   Filed Weights   08/30/19 0813  Weight: 54.9  kg   Examination: Physical Exam:  Constitutional: Patient is a thin African-American male who is chronically ill-appearing and currently in no acute distress and is about to go for his Port-A-Cath placement.   Eyes: Lids and conjunctivae normal, sclerae anicteric  ENMT: External Ears, Nose appear normal. Grossly normal hearing.  Neck: Appears normal, supple, no cervical masses, normal ROM, no appreciable thyromegaly; no JVD Respiratory: Diminished to auscultation bilaterally, no wheezing, rales, rhonchi or crackles. Normal respiratory effort and patient is not tachypenic. No accessory muscle use.  He has unlabored breathing Cardiovascular: Mildly tachycardic regular rate, no murmurs / rubs / gallops. S1 and S2 auscultated.  Abdomen: Soft, slightly tender to palpate., non-distended. Bowel sounds positive.  GU:  Deferred. Musculoskeletal: No clubbing / cyanosis of digits/nails. Normal strength and muscle tone.  Skin: No rashes, lesions, ulcers on limited skin evaluation. No induration; Warm and dry.  Neurologic: CN 2-12 grossly intact with no focal deficits. Romberg sign cerebellar reflexes not assessed.  Psychiatric: Normal judgment and insight. Alert and oriented x 3. Normal mood and appropriate affect.    Data Reviewed: I have personally reviewed following labs and imaging studies  CBC: Recent Labs  Lab 08/30/19 0848 08/31/19 0607 08/31/19 1723 09/01/19 0540  WBC 11.9* 18.5* 16.7* 16.7*  NEUTROABS 9.2*  --  13.3* 13.5*  HGB 13.2 11.7* 11.6* 11.1*  HCT 41.0 36.6* 36.5* 34.7*  MCV 85.4 85.5 86.5 85.3  PLT 333 275 287 001   Basic Metabolic Panel: Recent Labs  Lab 08/30/19 0848 08/31/19 0607 09/01/19 0540  NA 128* 129* 132*  K 4.4 4.4 4.2  CL 94* 101 101  CO2 25 21* 19*  GLUCOSE 108* 87 83  BUN 15 10 11   CREATININE 0.70 0.53* 0.56*  CALCIUM 11.0* 9.6 10.1  MG  --   --  2.2  PHOS  --   --  1.1*   GFR: Estimated Creatinine Clearance: 96.3 mL/min (A) (by C-G formula based on SCr of 0.56 mg/dL (L)). Liver Function Tests: Recent Labs  Lab 08/30/19 0848 08/31/19 0607 09/01/19 0540  AST 124* 143* 111*  ALT 26 26 25   ALKPHOS 129* 116 109  BILITOT 0.9 1.1 1.0  PROT 10.0* 8.0 8.0  ALBUMIN 2.9* 2.3* 2.4*   No results for input(s): LIPASE, AMYLASE in the last 168 hours. No results for input(s): AMMONIA in the last 168 hours. Coagulation Profile: Recent Labs  Lab 09/01/19 0540  INR 1.4*   Cardiac Enzymes: No results for input(s): CKTOTAL, CKMB, CKMBINDEX, TROPONINI in the last 168 hours. BNP (last 3 results) No results for input(s): PROBNP in the last 8760 hours. HbA1C: No results for input(s): HGBA1C in the last 72 hours. CBG: No results for input(s): GLUCAP in the last 168 hours. Lipid Profile: No results for input(s): CHOL, HDL, LDLCALC, TRIG, CHOLHDL, LDLDIRECT  in the last 72 hours. Thyroid Function Tests: No results for input(s): TSH, T4TOTAL, FREET4, T3FREE, THYROIDAB in the last 72 hours. Anemia Panel: No results for input(s): VITAMINB12, FOLATE, FERRITIN, TIBC, IRON, RETICCTPCT in the last 72 hours. Sepsis Labs: No results for input(s): PROCALCITON, LATICACIDVEN in the last 168 hours.  Recent Results (from the past 240 hour(s))  SARS CORONAVIRUS 2 (TAT 6-24 HRS) Nasopharyngeal Nasopharyngeal Swab     Status: None   Collection Time: 08/30/19  5:02 PM   Specimen: Nasopharyngeal Swab  Result Value Ref Range Status   SARS Coronavirus 2 NEGATIVE NEGATIVE Final    Comment: (NOTE) SARS-CoV-2 target nucleic acids are NOT DETECTED.  The SARS-CoV-2 RNA is generally detectable in upper and lower respiratory specimens during the acute phase of infection. Negative results do not preclude SARS-CoV-2 infection, do not rule out co-infections with other pathogens, and should not be used as the sole basis for treatment or other patient management decisions. Negative results must be combined with clinical observations, patient history, and epidemiological information. The expected result is Negative. Fact Sheet for Patients: SugarRoll.be Fact Sheet for Healthcare Providers: https://www.woods-mathews.com/ This test is not yet approved or cleared by the [place] FDA and  has been authorized for detection and/or diagnosis of SARS-CoV-2 by FDA under an Emergency Use Authorization (EUA). This EUA will remain  in effect (meaning this test can be used) for the duration of the COVID-19 declaration under Section 56 4(b)(1) of the Act, 21 U.S.C. section 360bbb-3(b)(1), unless the authorization is terminated or revoked sooner. Performed at [facility], [place] [address], [place], [place] [address]   Culture, blood (routine x 2)     Status: None (Preliminary result)   Collection Time: 08/31/19  5:23 PM    Specimen: BLOOD  Result Value Ref Range Status   Specimen Description   Final    BLOOD RIGHT ARM Performed at [facility] [address] [place], [place] [address]    Special Requests   Final    BOTTLES DRAWN AEROBIC AND ANAEROBIC Blood Culture adequate volume Performed at [facility] [address] [place], [place] [address]    Culture   Final    NO GROWTH < 12 HOURS Performed at [facility] [address] [place], [place] [address]    Report Status PENDING  Incomplete  Culture, blood (routine x 2)     Status: None (Preliminary result)   Collection Time: 08/31/19  5:23 PM   Specimen: BLOOD  Result Value Ref Range Status   Specimen Description   Final    BLOOD RIGHT HAND Performed at [facility] [address] [place], [place] [address]    Special Requests   Final    BOTTLES DRAWN AEROBIC AND ANAEROBIC Blood Culture adequate volume Performed at [facility] [address], [place],  [address]    Culture   Final    NO GROWTH < 12 HOURS Performed at [facility] [address], [place],  [address]    Report Status PENDING  Incomplete     RN Pressure Injury Documentation:     Estimated body mass index is 19.53 kg/m as calculated from the following:   Height as of this encounter: 5' 6"  (1.676 m).   Weight as of this encounter: 54.9 kg.  Malnutrition Type:  Nutrition Problem: Increased nutrient needs Etiology: chronic illness, cancer and cancer related treatments   Malnutrition Characteristics:  Signs/Symptoms: estimated needs   Nutrition Interventions:  Interventions: MVI, Other (Comment)([place])  Radiology Studies: CT HEAD WO CONTRAST  Result Date: 09/01/2019 CLINICAL DATA:  Mental status change, persistent or worsening. EXAM: CT HEAD WITHOUT CONTRAST TECHNIQUE: Contiguous axial images were obtained from the base of the skull through the  vertex without intravenous contrast. COMPARISON:  No pertinent prior studies available for comparison. FINDINGS: Brain: There is no evidence of acute intracranial hemorrhage, intracranial mass, midline shift or extra-axial fluid collection.No demarcated cortical infarction. Incidentally noted cavum septum pellucidum and cavum vergae. Vascular: No hyperdense vessel Skull: Normal. Negative for fracture or focal lesion. Sinuses/Orbits: Visualized orbits demonstrate no acute abnormality. No significant  paranasal sinus disease or mastoid effusion at the imaged levels. IMPRESSION: No evidence of acute intracranial abnormality. Electronically Signed   By: Kellie Simmering DO   On: 09/01/2019 13:35   IR IMAGING GUIDED PORT INSERTION  Result Date: 09/01/2019 INDICATION: History of hepatocellular carcinoma. In need of durable intravenous access for chemotherapy administration. EXAM: IMPLANTED PORT A CATH PLACEMENT WITH ULTRASOUND AND FLUOROSCOPIC GUIDANCE COMPARISON:  PET-CT-08/08/2019 MEDICATIONS: Ancef 2 gm IV; The antibiotic was administered within an appropriate time interval prior to skin puncture. ANESTHESIA/SEDATION: Moderate (conscious) sedation was employed during this procedure. A total of Versed 2 mg and Fentanyl 100 mcg was administered intravenously. Moderate Sedation Time: 22 minutes. The patient's level of consciousness and vital signs were monitored continuously by radiology nursing throughout the procedure under my direct supervision. CONTRAST:  None FLUOROSCOPY TIME:  18 seconds (3 mGy) COMPLICATIONS: None immediate. PROCEDURE: The procedure, risks, benefits, and alternatives were explained to the patient. Questions regarding the procedure were encouraged and answered. The patient understands and consents to the procedure. The right neck and chest were prepped with chlorhexidine in a sterile fashion, and a sterile drape was applied covering the operative field. Maximum barrier sterile technique with sterile  gowns and gloves were used for the procedure. A timeout was performed prior to the initiation of the procedure. Local anesthesia was provided with 1% lidocaine with epinephrine. After creating a small venotomy incision, a micropuncture kit was utilized to access the internal jugular vein. Real-time ultrasound guidance was utilized for vascular access including the acquisition of a permanent ultrasound image documenting patency of the accessed vessel. The microwire was utilized to measure appropriate catheter length. A subcutaneous port pocket was then created along the upper chest wall utilizing a combination of sharp and blunt dissection. The pocket was irrigated with sterile saline. A single lumen "Slim" sized power injectable port was chosen for placement. The 8 Fr catheter was tunneled from the port pocket site to the venotomy incision. The port was placed in the pocket. The external catheter was trimmed to appropriate length. At the venotomy, an 8 Fr peel-away sheath was placed over a guidewire under fluoroscopic guidance. The catheter was then placed through the sheath and the sheath was removed. Final catheter positioning was confirmed and documented with a fluoroscopic spot radiograph. The port was accessed with a Huber needle, aspirated and flushed with heparinized saline. The venotomy site was closed with an interrupted 4-0 Vicryl suture. The port pocket incision was closed with interrupted 2-0 Vicryl suture. Dermabond and Steri-strips were applied to both incisions. Dressings were applied. The patient tolerated the procedure well without immediate post procedural complication. FINDINGS: After catheter placement, the tip lies within the superior cavoatrial junction. The catheter aspirates and flushes normally and is ready for immediate use. IMPRESSION: Successful placement of a right internal jugular approach power injectable Port-A-Cath. The catheter is ready for immediate use. Electronically Signed   By:  Sandi Mariscal M.D.   On: 09/01/2019 13:02   Scheduled Meds: . dronabinol  2.5 mg Oral BID AC  . DULoxetine  20 mg Oral Daily  . entecavir  0.5 mg Oral Daily  . feeding supplement ([employer] STANDARD 1.4)  325 mL Oral BID BM  . fentaNYL      . heparin  5,000 Units Subcutaneous Q12H  . heparin lock flush      . lidocaine-EPINEPHrine      . metoCLOPramide  5 mg Oral TID AC & HS  . midazolam      .  multivitamin with minerals  1 tablet Oral Daily  . oxyCODONE  10 mg Oral Q8H  . polyethylene glycol  17 g Oral BID  . senna-docusate  1 tablet Oral BID   Continuous Infusions: . sodium chloride 75 mL/hr at 09/01/19 1402  . sodium phosphate  Dextrose 5% IVPB 30 mmol (09/01/19 1403)    LOS: 1 day   Kerney Elbe, DO [facility] PAGER is on [place]  If 7PM-7AM, please contact night-coverage [email or web address]

## 2019-09-01 NOTE — Progress Notes (Signed)
MEDICATION-RELATED CONSULT NOTE   IR Procedure Consult - Anticoagulant/Antiplatelet PTA/Inpatient Med List Review by Pharmacist    Procedure: placement of right IJ approach port-a-cath with tip at the superior caval atrial junction    Completed: 09/01/2019 at ~1246  Post-Procedural bleeding risk per IR MD assessment: standard  Antithrombotic medications on inpatient or PTA profile prior to procedure:  SQ heparin     Recommended restart time per IR Post-Procedure Guidelines: day 0 at least 6 hours after procedure or at next standard dose interval        Plan:     Heparin 5000 units SQ q12h scheduled per IR to resume 09/01/2019 at 2200. Appropriate per IR guidelines.    Pharmacy to sign off.    Lindell Spar, PharmD, BCPS Clinical Pharmacist  09/01/2019 3:50 PM

## 2019-09-02 DIAGNOSIS — R1011 Right upper quadrant pain: Secondary | ICD-10-CM

## 2019-09-02 DIAGNOSIS — K5900 Constipation, unspecified: Secondary | ICD-10-CM

## 2019-09-02 LAB — CBC WITH DIFFERENTIAL/PLATELET
Abs Immature Granulocytes: 0.08 10*3/uL — ABNORMAL HIGH (ref 0.00–0.07)
Basophils Absolute: 0 10*3/uL (ref 0.0–0.1)
Basophils Relative: 0 %
Eosinophils Absolute: 0 10*3/uL (ref 0.0–0.5)
Eosinophils Relative: 0 %
HCT: 34.6 % — ABNORMAL LOW (ref 39.0–52.0)
Hemoglobin: 11 g/dL — ABNORMAL LOW (ref 13.0–17.0)
Immature Granulocytes: 1 %
Lymphocytes Relative: 13 %
Lymphs Abs: 2.1 10*3/uL (ref 0.7–4.0)
MCH: 27 pg (ref 26.0–34.0)
MCHC: 31.8 g/dL (ref 30.0–36.0)
MCV: 84.8 fL (ref 80.0–100.0)
Monocytes Absolute: 1.3 10*3/uL — ABNORMAL HIGH (ref 0.1–1.0)
Monocytes Relative: 8 %
Neutro Abs: 12.8 10*3/uL — ABNORMAL HIGH (ref 1.7–7.7)
Neutrophils Relative %: 78 %
Platelets: 270 10*3/uL (ref 150–400)
RBC: 4.08 MIL/uL — ABNORMAL LOW (ref 4.22–5.81)
RDW: 14.1 % (ref 11.5–15.5)
WBC: 16.3 10*3/uL — ABNORMAL HIGH (ref 4.0–10.5)
nRBC: 0 % (ref 0.0–0.2)

## 2019-09-02 LAB — COMPREHENSIVE METABOLIC PANEL
ALT: 24 U/L (ref 0–44)
AST: 102 U/L — ABNORMAL HIGH (ref 15–41)
Albumin: 2.2 g/dL — ABNORMAL LOW (ref 3.5–5.0)
Alkaline Phosphatase: 100 U/L (ref 38–126)
Anion gap: 8 (ref 5–15)
BUN: 10 mg/dL (ref 6–20)
CO2: 22 mmol/L (ref 22–32)
Calcium: 10.1 mg/dL (ref 8.9–10.3)
Chloride: 98 mmol/L (ref 98–111)
Creatinine, Ser: 0.5 mg/dL — ABNORMAL LOW (ref 0.61–1.24)
GFR calc Af Amer: >60 mL/min (ref >60)
GFR calc non Af Amer: >60 mL/min (ref >60)
Glucose, Bld: 89 mg/dL (ref 70–99)
Potassium: 3.9 mmol/L (ref 3.5–5.1)
Sodium: 128 mmol/L — ABNORMAL LOW (ref 135–145)
Total Bilirubin: 0.7 mg/dL (ref 0.3–1.2)
Total Protein: 7.8 g/dL (ref 6.5–8.1)

## 2019-09-02 LAB — MAGNESIUM: Magnesium: 2 mg/dL (ref 1.7–2.4)

## 2019-09-02 LAB — PHOSPHORUS: Phosphorus: 1.3 mg/dL — ABNORMAL LOW (ref 2.5–4.6)

## 2019-09-02 LAB — URINE CULTURE: Culture: <10000 — AB

## 2019-09-02 MED ORDER — SODIUM PHOSPHATES 45 MMOLE/15ML IV SOLN
30.0000 mmol | Freq: Once | INTRAVENOUS | Status: AC
  Administered 2019-09-02: 30 mmol via INTRAVENOUS
  Filled 2019-09-02: qty 10

## 2019-09-02 NOTE — Progress Notes (Signed)
PROGRESS NOTE    Maurice Lowe  RXY:[id number] DOB: [birth year] DOA: 08/30/2019 PCP: Patient, No Pcp Per   Brief Narrative:  HPI per Dr. Truddie Hidden on 08/30/19 Maurice Lowe is a 39 y.o. male with medical history significant of hepatocellular carcinoma, hepatitis B, history of non-Hodgkin lymphoma who presents with ongoing abdominal pain in setting malignancy and nausea/ vomiting.  Patient reports he has been dealing with pain for quite a while now and his oncology team has been titrating his pain medications but he still maintains that he has severe epigastric pain at times and on occasion to his left side.  He states that for the past 1 week he has been mostly sedentary because movement exacerbates the pain and he has to be somewhat of an incline position to tolerate.  He reports he is also not had any real food for over a week now and has been vomiting every 4 hours.  He denies any fevers, chills but reports that he has occasional hot flashes.  He does not report any particular trigger to this episode.  It is unclear if he filled his duloxetine and or some of the pain medication changes from his last visit were implemented.  He does report constipation and reports his last bowel movement was 4 days ago but also has not eaten much in over a week.  He denies any chest pain, palpitations, shortness of breath.  Patient lives at home with wife and 3 children  **Interim History  States that his nausea is a little bit better and is tolerating a clear liquid diet and his was advanced to Regular.  Was in significant pain but states that this is worse when he moves but is better controlled...  Had not had a bowel movement in several days however had a bowel movement finally after a suppository  Underwent port placement yesterday by interventional radiology.  During the course of his hospitalization he has been hallucinating and was delirious the night before last so obtain a head CT scan  which was negative.  Medical oncology consulted for further evaluation and they gave the patient the enema yesterday.  His Phos is low today but he is improving and states that he feels better.  We will replete his Phos given that is significantly low we will continue IV fluid hydration for now and stop in the a.m. likely. Will obtain PT/OT evaluation as well  Assessment & Plan:   Active Problems:   Hypercalcemia   Constipation  Hypercalcemia, improving Refractory nausea/vomiting Hepatocellular carcinoma Hep B Cirrhosis -Patient follows with Dr. Burr Medico is currently on palliative immunotherapy on biologic and thus far has tolerated.  Tolerated first cycle of Tecentriq and Avastin moderately well -His pain regimen has been adjusted and he is currently on OxyContin 10 mg every 8 hours with oxycodone 5 mg every 4 hours for breakthrough pain.    Is also on Dilaudid currently so we will need to wean that off as well -He has been taking his bowel regimen so unclear if some of this pain and nausea vomiting may be related to opiate use.  Plain film does not reveal obstruction has not had a bowel movement in 6 days.   -Continue prior to admission dronabinol, oxycodone, Reglan and bowel regimen -Continue antiemetics as needed -We will obtain PT and OT to further evaluate and treat -Was placed on a clear liquid diet and will go to a full liquid diet today and has been tolerating that and is  now on a regular diet -Continue IV fluids for dehydration and hypercalcemia -Continue Entecavir -AST has been elevated has been trending down from 143 and today is 102 -Does not appear reflux is a component but could consider PPI trial  -Continue with antiemetics -Patient has been placed yesterday -I spoke with Dr. Domingo Cocking and patient's pain was actually a little bit better today so we will hold off on palliative consultation have discontinued this given that his pain is fairly well controlled at this time the  OxyContin and oxycodone for breakthrough pain and hydromorphone -Oncology is following and appreciate their assistance;  -Dietitian recommending diet advancement and ordering Dillard Essex twice daily along with daily multivitamin with minerals -Obtain PT and OT to further evaluate and treat  Constipation -Continue with bowel regimen with senna docusate, MiraLAX as well as bisacodyl suppositories -Medical oncology is scheduling lactulose as well as Dulcolax -Patient received an enema yesterday with a fleets enema with improvement  Metabolic Acidosis  -Mild with a CO2 of 21, anion gap of 7, and a chloride level of 101 and now his CO2 is 22, chloride level is 98, anion gap is 8   Hyponatremia, slight drop -Suspect largely hypovolemic from poor intake and Nausea/Vomiting and  in part from cirrhosis -Continue IVF and was initially improving and had a sodium 132 but dropped again acutely to 128 -Monitor and trend and may need to stop fluids -Repeat CMP in a.m.  History of T-cell non-Hodgkin's lymphoma -Status post chemotherapy, no active issues  Leukocytosis, improving slightly -Likely reactive in the setting of his cancer in from dehydration Currently no signs and symptoms of infection is afebrile next -check blood cultures x2 and showed no growth to date -Check urinalysis as well as urine culture; urinalysis was unremarkable -Chest x-ray is clear -KUB showed Gas-filled colon with scattered stool. With no signs of obstruction or free air -Continue to monitor for signs and symptoms of infection; currently no infection noted Patient's WBC trended up to 18.5 is now 16.3 -Repeat CBC in a.m.  Normocytic Anemia -Patient is hemoglobin/hematocrit is now 11.0/34.6 -Check anemia panel in the a.m. -Continue to monitor for signs and symptoms of bleeding; currently no overt bleeding noted -Repeat CBC in a.m.  Hypophosphatemia  -Patient's phosphorus level is 1.3 -Replete with IV K-Phos 30 mmol  again -Continue monitor and replete as necessary -Repeat phosphorus level in a.m.  Confusion and Hallucinations, improving and feels more closer to baseline -Could be related pain medications -Hypercalcemia is improving -Check head CT without contrast showed no acute intracranial abnormality -We will need to discuss with oncology -Continue with delirium precautions -Follow-up on cultures  DVT prophylaxis: Subcu heparin Code Status: FULL CODE  Family Communication: Discussed with mother at bedside Disposition Plan: Pending further improvement of his nausea vomiting and abdominal pain and is to tolerate p.o. diet prior to safe discharge disposition  Consultants:   Medical Oncology  Palliative Care Medicine  Interventional Radiology    Procedures: Port-A-Cath placement to be done in the morning  Antimicrobials:  Anti-infectives (From admission, onward)   Start     Dose/Rate Route Frequency Ordered Stop   09/01/19 1500  ceFAZolin (ANCEF) IVPB 2g/100 mL premix     2 g 200 mL/hr over 30 Minutes Intravenous To Radiology 08/31/19 1826 09/01/19 1240   08/31/19 1000  entecavir (BARACLUDE) tablet 0.5 mg     0.5 mg Oral Daily 08/30/19 1226       Subjective: Seen and examined at bedside states that he  is doing better today and feeling stronger.  Had a large bowel movement yesterday after the enema.  Still complains of some abdominal pain but states it hurts worse when he moves.  No nausea or vomiting.  Feeling better and getting closer back to baseline and has not had any nausea or vomiting.  Objective: Vitals:   09/01/19 1240 09/01/19 1350 09/02/19 0712 09/02/19 1352  BP: 136/89 (!) 120/91 (!) 135/95 125/85  Pulse: (!) 107 (!) 102 97 (!) 106  Resp: (!) 24 20 15 19   Temp:  98 F (36.7 C) 98.3 F (36.8 C) 97.7 F (36.5 C)  TempSrc:  Oral Oral Oral  SpO2: 100%  99% 98%  Weight:      Height:        Intake/Output Summary (Last 24 hours) at 09/02/2019 1641 Last data filed at  09/02/2019 1600 Gross per 24 hour  Intake 1254.64 ml  Output 500 ml  Net 754.64 ml   Filed Weights   08/30/19 0813  Weight: 54.9 kg   Examination: Physical Exam:  Constitutional: Patient is a thin African-American male who is chronically ill-appearing currently no acute distress and feels a little bit better today.  Still complains of some pain but states it is tolerable currently. Eyes: Lids and conjunctivae normal, sclerae anicteric  ENMT: External Ears, Nose appear normal. Grossly normal hearing.  Neck: Appears normal, supple, no cervical masses, normal ROM, no appreciable thyromegaly; no JVD Respiratory: Diminished to auscultation bilaterally, no wheezing, rales, rhonchi or crackles. Normal respiratory effort and patient is not tachypenic. No accessory muscle use. Unlabored Breathing  Cardiovascular: RRR, no murmurs / rubs / gallops. S1 and S2 auscultated.  Abdomen: Soft, tender to palpate, non-distended. Bowel sounds positive x4.  GU: Deferred. Musculoskeletal: No clubbing / cyanosis of digits/nails. No joint deformity upper and lower extremities.  Skin: No rashes, lesions, ulcers on a limited skin evaluation. No induration; Warm and dry.  Neurologic: CN 2-12 grossly intact with no focal deficits. Romberg sign cerebellar reflexes not assessed.  Psychiatric: Normal judgment and insight. Alert and oriented x 3. Normal mood and appropriate affect.   Data Reviewed: I have personally reviewed following labs and imaging studies  CBC: Recent Labs  Lab 08/30/19 0848 08/31/19 0607 08/31/19 1723 09/01/19 0540 09/02/19 0533  WBC 11.9* 18.5* 16.7* 16.7* 16.3*  NEUTROABS 9.2*  --  13.3* 13.5* 12.8*  HGB 13.2 11.7* 11.6* 11.1* 11.0*  HCT 41.0 36.6* 36.5* 34.7* 34.6*  MCV 85.4 85.5 86.5 85.3 84.8  PLT 333 275 287 267 151   Basic Metabolic Panel: Recent Labs  Lab 08/30/19 0848 08/31/19 0607 09/01/19 0540 09/02/19 0533  NA 128* 129* 132* 128*  K 4.4 4.4 4.2 3.9  CL 94* 101 101  98  CO2 25 21* 19* 22  GLUCOSE 108* 87 83 89  BUN 15 10 11 10   CREATININE 0.70 0.53* 0.56* 0.50*  CALCIUM 11.0* 9.6 10.1 10.1  MG  --   --  2.2 2.0  PHOS  --   --  1.1* 1.3*   GFR: Estimated Creatinine Clearance: 96.3 mL/min (A) (by C-G formula based on SCr of 0.5 mg/dL (L)). Liver Function Tests: Recent Labs  Lab 08/30/19 0848 08/31/19 0607 09/01/19 0540 09/02/19 0533  AST 124* 143* 111* 102*  ALT 26 26 25 24   ALKPHOS 129* 116 109 100  BILITOT 0.9 1.1 1.0 0.7  PROT 10.0* 8.0 8.0 7.8  ALBUMIN 2.9* 2.3* 2.4* 2.2*   No results for input(s): LIPASE, AMYLASE in  the last 168 hours. No results for input(s): AMMONIA in the last 168 hours. Coagulation Profile: Recent Labs  Lab 09/01/19 0540  INR 1.4*   Cardiac Enzymes: No results for input(s): CKTOTAL, CKMB, CKMBINDEX, TROPONINI in the last 168 hours. BNP (last 3 results) No results for input(s): PROBNP in the last 8760 hours. HbA1C: No results for input(s): HGBA1C in the last 72 hours. CBG: No results for input(s): GLUCAP in the last 168 hours. Lipid Profile: No results for input(s): CHOL, HDL, LDLCALC, TRIG, CHOLHDL, LDLDIRECT in the last 72 hours. Thyroid Function Tests: No results for input(s): TSH, T4TOTAL, FREET4, T3FREE, THYROIDAB in the last 72 hours. Anemia Panel: No results for input(s): VITAMINB12, FOLATE, FERRITIN, TIBC, IRON, RETICCTPCT in the last 72 hours. Sepsis Labs: No results for input(s): PROCALCITON, LATICACIDVEN in the last 168 hours.  Recent Results (from the past 240 hour(s))  SARS CORONAVIRUS 2 (TAT 6-24 HRS) Nasopharyngeal Nasopharyngeal Swab     Status: None   Collection Time: 08/30/19  5:02 PM   Specimen: Nasopharyngeal Swab  Result Value Ref Range Status   SARS Coronavirus 2 NEGATIVE NEGATIVE Final    Comment: (NOTE) SARS-CoV-2 target nucleic acids are NOT DETECTED. The SARS-CoV-2 RNA is generally detectable in upper and lower respiratory specimens during the acute phase of infection.  Negative results do not preclude SARS-CoV-2 infection, do not rule out co-infections with other pathogens, and should not be used as the sole basis for treatment or other patient management decisions. Negative results must be combined with clinical observations, patient history, and epidemiological information. The expected result is Negative. Fact Sheet for Patients: SugarRoll.be Fact Sheet for Healthcare Providers: https://www.woods-mathews.com/ This test is not yet approved or cleared by the [place] FDA and  has been authorized for detection and/or diagnosis of SARS-CoV-2 by FDA under an Emergency Use Authorization (EUA). This EUA will remain  in effect (meaning this test can be used) for the duration of the COVID-19 declaration under Section 56 4(b)(1) of the Act, 21 U.S.C. section 360bbb-3(b)(1), unless the authorization is terminated or revoked sooner. Performed at [facility], [place] [address] [place], [place] [address]   Culture, Urine     Status: Abnormal   Collection Time: 08/31/19  4:37 PM   Specimen: Urine, Random  Result Value Ref Range Status   Specimen Description   Final    URINE, RANDOM Performed at [facility] [address] [place], [place] [address]    Special Requests   Final    NONE Performed at [facility], [place] [address] [place], [place] [address]    Culture (A)  Final    <10,000 COLONIES/mL INSIGNIFICANT GROWTH Performed at [facility] [address] [place], [place] [address]    Report Status 09/02/2019 FINAL  Final  Culture, blood (routine x 2)     Status: None (Preliminary result)   Collection Time: 08/31/19  5:23 PM   Specimen: BLOOD  Result Value Ref Range Status   Specimen Description   Final    BLOOD RIGHT ARM Performed at [facility] [address] [place], [place] [address]    Special Requests   Final    BOTTLES  DRAWN AEROBIC AND ANAEROBIC Blood Culture adequate volume Performed at [facility] [address], [place], [place] [address]    Culture   Final    NO GROWTH 2 DAYS Performed at [facility],  [place] [address]    Report Status PENDING  Incomplete  Culture, blood (routine x 2)     Status: None (Preliminary result)   Collection Time: 08/31/19  5:23 PM   Specimen: BLOOD  Result Value Ref Range Status   Specimen Description   Final    BLOOD RIGHT HAND Performed at [facility] [address], [place], [place] [address]    Special Requests   Final    BOTTLES DRAWN AEROBIC AND ANAEROBIC Blood Culture adequate volume Performed at [facility] [address], [place], [place] [address]    Culture   Final    NO GROWTH 2 DAYS Performed at [facility] [address] [place], [place] [address]    Report Status PENDING  Incomplete     RN Pressure Injury Documentation:     Estimated body mass index is 19.53 kg/m as calculated from the following:   Height as of this encounter: 5' 6"  (1.676 m).   Weight as of this encounter: 54.9 kg.  Malnutrition Type:  Nutrition Problem: Increased nutrient needs Etiology: chronic illness, cancer and cancer related treatments   Malnutrition Characteristics:  Signs/Symptoms: estimated needs   Nutrition Interventions:  Interventions: MVI, Other (Comment)([place])  Radiology Studies: CT HEAD WO CONTRAST  Result Date: 09/01/2019 CLINICAL DATA:  Mental status change, persistent or worsening. EXAM: CT HEAD WITHOUT CONTRAST TECHNIQUE: Contiguous axial images were obtained from the base of the skull through the vertex without intravenous contrast. COMPARISON:  No pertinent prior studies available for comparison. FINDINGS: Brain: There is no evidence of acute intracranial hemorrhage, intracranial mass, midline shift or extra-axial fluid collection.No  demarcated cortical infarction. Incidentally noted cavum septum pellucidum and cavum vergae. Vascular: No hyperdense vessel Skull: Normal. Negative for fracture or focal lesion. Sinuses/Orbits: Visualized orbits demonstrate no acute abnormality. No significant paranasal sinus disease or mastoid effusion at the imaged levels. IMPRESSION: No evidence of acute intracranial abnormality. Electronically Signed   By: Kellie Simmering DO   On: 09/01/2019 13:35   IR IMAGING GUIDED PORT INSERTION  Result Date: 09/01/2019 INDICATION: History of hepatocellular carcinoma. In need of durable intravenous access for chemotherapy administration. EXAM: IMPLANTED PORT A CATH PLACEMENT WITH ULTRASOUND AND FLUOROSCOPIC GUIDANCE COMPARISON:  PET-CT-08/08/2019 MEDICATIONS: Ancef 2 gm IV; The antibiotic was administered within an appropriate time interval prior to skin puncture. ANESTHESIA/SEDATION: Moderate (conscious) sedation was employed during this procedure. A total of Versed 2 mg and Fentanyl 100 mcg was administered intravenously. Moderate Sedation Time: 22 minutes. The patient's level of consciousness and vital signs were monitored continuously by radiology nursing throughout the procedure under my direct supervision. CONTRAST:  None FLUOROSCOPY TIME:  18 seconds (3 mGy) COMPLICATIONS: None immediate. PROCEDURE: The procedure, risks, benefits, and alternatives were explained to the patient. Questions regarding the procedure were encouraged and answered. The patient understands and consents to the procedure. The right neck and chest were prepped with chlorhexidine in a sterile fashion, and a sterile drape was applied covering the operative field. Maximum barrier sterile technique with sterile gowns and gloves were used for the procedure. A timeout was performed prior to the initiation of the procedure. Local anesthesia was provided with 1% lidocaine with epinephrine. After creating a small venotomy incision, a micropuncture kit was  utilized to access the internal jugular vein. Real-time ultrasound guidance was utilized for vascular access including the acquisition of a permanent ultrasound image documenting patency of the accessed vessel. The microwire was utilized to measure appropriate catheter  length. A subcutaneous port pocket was then created along the upper chest wall utilizing a combination of sharp and blunt dissection. The pocket was irrigated with sterile saline. A single lumen "Slim" sized power injectable port was chosen for placement. The 8 Fr catheter was tunneled from the port pocket site to the venotomy incision. The port was placed in the pocket. The external catheter was trimmed to appropriate length. At the venotomy, an 8 Fr peel-away sheath was placed over a guidewire under fluoroscopic guidance. The catheter was then placed through the sheath and the sheath was removed. Final catheter positioning was confirmed and documented with a fluoroscopic spot radiograph. The port was accessed with a Huber needle, aspirated and flushed with heparinized saline. The venotomy site was closed with an interrupted 4-0 Vicryl suture. The port pocket incision was closed with interrupted 2-0 Vicryl suture. Dermabond and Steri-strips were applied to both incisions. Dressings were applied. The patient tolerated the procedure well without immediate post procedural complication. FINDINGS: After catheter placement, the tip lies within the superior cavoatrial junction. The catheter aspirates and flushes normally and is ready for immediate use. IMPRESSION: Successful placement of a right internal jugular approach power injectable Port-A-Cath. The catheter is ready for immediate use. Electronically Signed   By: Sandi Mariscal M.D.   On: 09/01/2019 13:02   Scheduled Meds: . dronabinol  2.5 mg Oral BID AC  . DULoxetine  20 mg Oral Daily  . entecavir  0.5 mg Oral Daily  . feeding supplement (K[employer]AR[employer] STANDARD 1.4)  325 mL Oral BID BM  . heparin   5,000 Units Subcutaneous Q12H  . metoCLOPramide  5 mg Oral TID AC & HS  . multivitamin with minerals  1 tablet Oral Daily  . oxyCODONE  10 mg Oral Q8H  . polyethylene glycol  17 g Oral BID  . senna-docusate  1 tablet Oral BID   Continuous Infusions: . sodium chloride Stopped (09/02/19 0947)    LOS: 2 days   Kerney Elbe, DO [facility] PAGER is on [place]  If 7PM-7AM, please contact night-coverage [email or web address]

## 2019-09-03 ENCOUNTER — Encounter (HOSPITAL_COMMUNITY): Payer: Self-pay | Admitting: Internal Medicine

## 2019-09-03 ENCOUNTER — Inpatient Hospital Stay (HOSPITAL_COMMUNITY): Payer: BC Managed Care – PPO

## 2019-09-03 DIAGNOSIS — G8929 Other chronic pain: Secondary | ICD-10-CM | POA: Diagnosis not present

## 2019-09-03 DIAGNOSIS — Z7401 Bed confinement status: Secondary | ICD-10-CM | POA: Diagnosis not present

## 2019-09-03 DIAGNOSIS — R531 Weakness: Secondary | ICD-10-CM | POA: Diagnosis not present

## 2019-09-03 DIAGNOSIS — Z515 Encounter for palliative care: Secondary | ICD-10-CM | POA: Diagnosis not present

## 2019-09-03 DIAGNOSIS — C220 Liver cell carcinoma: Secondary | ICD-10-CM | POA: Diagnosis not present

## 2019-09-03 DIAGNOSIS — R1084 Generalized abdominal pain: Secondary | ICD-10-CM | POA: Diagnosis not present

## 2019-09-03 DIAGNOSIS — K5900 Constipation, unspecified: Secondary | ICD-10-CM | POA: Diagnosis not present

## 2019-09-03 DIAGNOSIS — R1013 Epigastric pain: Secondary | ICD-10-CM | POA: Diagnosis not present

## 2019-09-03 DIAGNOSIS — Z7189 Other specified counseling: Secondary | ICD-10-CM | POA: Diagnosis not present

## 2019-09-03 DIAGNOSIS — R52 Pain, unspecified: Secondary | ICD-10-CM | POA: Diagnosis not present

## 2019-09-03 DIAGNOSIS — G893 Neoplasm related pain (acute) (chronic): Secondary | ICD-10-CM | POA: Diagnosis not present

## 2019-09-03 DIAGNOSIS — M2550 Pain in unspecified joint: Secondary | ICD-10-CM | POA: Diagnosis not present

## 2019-09-03 LAB — IRON AND TIBC
Iron: 32 ug/dL — ABNORMAL LOW (ref 45–182)
Saturation Ratios: 20 % (ref 17.9–39.5)
TIBC: 163 ug/dL — ABNORMAL LOW (ref 250–450)
UIBC: 131 ug/dL

## 2019-09-03 LAB — CBC WITH DIFFERENTIAL/PLATELET
Abs Immature Granulocytes: 0.05 10*3/uL (ref 0.00–0.07)
Basophils Absolute: 0 10*3/uL (ref 0.0–0.1)
Basophils Relative: 0 %
Eosinophils Absolute: 0.1 10*3/uL (ref 0.0–0.5)
Eosinophils Relative: 0 %
HCT: 34.7 % — ABNORMAL LOW (ref 39.0–52.0)
Hemoglobin: 11 g/dL — ABNORMAL LOW (ref 13.0–17.0)
Immature Granulocytes: 0 %
Lymphocytes Relative: 13 %
Lymphs Abs: 1.7 10*3/uL (ref 0.7–4.0)
MCH: 27 pg (ref 26.0–34.0)
MCHC: 31.7 g/dL (ref 30.0–36.0)
MCV: 85.3 fL (ref 80.0–100.0)
Monocytes Absolute: 1.2 10*3/uL — ABNORMAL HIGH (ref 0.1–1.0)
Monocytes Relative: 10 %
Neutro Abs: 9.4 10*3/uL — ABNORMAL HIGH (ref 1.7–7.7)
Neutrophils Relative %: 77 %
Platelets: 286 10*3/uL (ref 150–400)
RBC: 4.07 MIL/uL — ABNORMAL LOW (ref 4.22–5.81)
RDW: 14.3 % (ref 11.5–15.5)
WBC: 12.4 10*3/uL — ABNORMAL HIGH (ref 4.0–10.5)
nRBC: 0 % (ref 0.0–0.2)

## 2019-09-03 LAB — RETICULOCYTES
Immature Retic Fract: 24.7 % — ABNORMAL HIGH (ref 2.3–15.9)
RBC.: 4.08 MIL/uL — ABNORMAL LOW (ref 4.22–5.81)
Retic Count, Absolute: 93 10*3/uL (ref 19.0–186.0)
Retic Ct Pct: 2.3 % (ref 0.4–3.1)

## 2019-09-03 LAB — COMPREHENSIVE METABOLIC PANEL
ALT: 31 U/L (ref 0–44)
AST: 119 U/L — ABNORMAL HIGH (ref 15–41)
Albumin: 2.1 g/dL — ABNORMAL LOW (ref 3.5–5.0)
Alkaline Phosphatase: 132 U/L — ABNORMAL HIGH (ref 38–126)
Anion gap: 6 (ref 5–15)
BUN: 8 mg/dL (ref 6–20)
CO2: 21 mmol/L — ABNORMAL LOW (ref 22–32)
Calcium: 10.1 mg/dL (ref 8.9–10.3)
Chloride: 101 mmol/L (ref 98–111)
Creatinine, Ser: 0.42 mg/dL — ABNORMAL LOW (ref 0.61–1.24)
GFR calc Af Amer: >60 mL/min (ref >60)
GFR calc non Af Amer: >60 mL/min (ref >60)
Glucose, Bld: 99 mg/dL (ref 70–99)
Potassium: 4 mmol/L (ref 3.5–5.1)
Sodium: 128 mmol/L — ABNORMAL LOW (ref 135–145)
Total Bilirubin: 0.7 mg/dL (ref 0.3–1.2)
Total Protein: 7.7 g/dL (ref 6.5–8.1)

## 2019-09-03 LAB — FOLATE: Folate: 2.2 ng/mL — ABNORMAL LOW (ref >5.9)

## 2019-09-03 LAB — MAGNESIUM: Magnesium: 1.9 mg/dL (ref 1.7–2.4)

## 2019-09-03 LAB — FERRITIN: Ferritin: 825 ng/mL — ABNORMAL HIGH (ref 24–336)

## 2019-09-03 LAB — PHOSPHORUS: Phosphorus: 1.3 mg/dL — ABNORMAL LOW (ref 2.5–4.6)

## 2019-09-03 LAB — AMMONIA: Ammonia: 60 umol/L — ABNORMAL HIGH (ref 9–35)

## 2019-09-03 LAB — VITAMIN B12: Vitamin B-12: 1223 pg/mL — ABNORMAL HIGH (ref 180–914)

## 2019-09-03 MED ORDER — IOHEXOL 9 MG/ML PO SOLN
ORAL | Status: AC
  Administered 2019-09-03: 500 mL
  Filled 2019-09-03: qty 1000

## 2019-09-03 MED ORDER — OXYCODONE HCL ER 15 MG PO T12A
15.0000 mg | EXTENDED_RELEASE_TABLET | Freq: Three times a day (TID) | ORAL | Status: DC
  Administered 2019-09-03 – 2019-09-08 (×13): 15 mg via ORAL
  Filled 2019-09-03 (×14): qty 1

## 2019-09-03 MED ORDER — POTASSIUM & SODIUM PHOSPHATES 280-160-250 MG PO PACK
2.0000 | PACK | ORAL | Status: AC
  Administered 2019-09-03 (×2): 2 via ORAL
  Filled 2019-09-03 (×2): qty 2

## 2019-09-03 MED ORDER — OXYCODONE HCL ER 20 MG PO T12A: 20.0000 mg | EXTENDED_RELEASE_TABLET | Freq: Three times a day (TID) | ORAL | Status: DC

## 2019-09-03 MED ORDER — OXYCODONE HCL ER 15 MG PO T12A: 15.0000 mg | EXTENDED_RELEASE_TABLET | Freq: Three times a day (TID) | ORAL | Status: DC

## 2019-09-03 MED ORDER — SODIUM CHLORIDE (PF) 0.9 % IJ SOLN
INTRAMUSCULAR | Status: AC
  Filled 2019-09-03: qty 50

## 2019-09-03 MED ORDER — IOHEXOL 300 MG/ML  SOLN
100.0000 mL | Freq: Once | INTRAMUSCULAR | Status: AC | PRN
  Administered 2019-09-03: 100 mL via INTRAVENOUS

## 2019-09-03 MED ORDER — LACTULOSE 10 GM/15ML PO SOLN
20.0000 g | Freq: Two times a day (BID) | ORAL | Status: DC
  Administered 2019-09-03: 20 g via ORAL
  Filled 2019-09-03: qty 30

## 2019-09-03 NOTE — Progress Notes (Addendum)
   09/03/19 2333  What Happened  Was fall witnessed? Yes  Who witnessed fall? Skylar Ene NT  Patients activity before fall bathroom-assisted  Point of contact buttocks  Was patient injured? No  Follow Up  MD notified M. Sharlet Salina  Time MD notified [phone]  Family notified Yes - comment (no answer- left VM for wife)  Time family notified [phone]  Additional tests No  Progress note created (see row info) Yes  Adult Fall Risk Assessment  Risk Factor Category (scoring not indicated) Fall has occurred during this admission (document High fall risk)  Patient Fall Risk Level High fall risk  Adult Fall Risk Interventions  Required Bundle Interventions *See Row Information* High fall risk - low, moderate, and high requirements implemented  Additional Interventions Use of appropriate toileting equipment (bedpan, BSC, etc.);Room near nurses station;PT/OT need assessed if change in mobility from baseline  Screening for Fall Injury Risk (To be completed on HIGH fall risk patients) - Assessing Need for Low Bed  Risk For Fall Injury- Low Bed Criteria Previous fall this admission  Will Implement Low Bed and Floor Mats Yes  Pt's bed alarm sounded (I had placed the alarm on even though he was scored a moderate fall risk because he had some pain meds earlier)- he was starting to get up to bathroom. NT entered the room and walked with the pt to the BR using the RW. She states he was seated on the toilet and she stepped right outside the door but left the door open to keep an eye on him. He stood up from the toilet without notifying the NT and became weak and went to the floor on his buttocks before NT could get to him (per her description of the event). Pt able to stand up with 1 assist and ambulate back to the bed with RW. No injuries noted on assessment, VS stable. Attempted to call pts wife, went to voicemail. I asked her to call me back at her convenience and that it was not emergent. Continue to monitor pt with post  fall protocol. Hortencia Conradi RN Addendum: pts wife returned my call at [place] and we discussed what happened and safety precautions that will be in place.

## 2019-09-03 NOTE — Evaluation (Signed)
Occupational Therapy Evaluation Patient Details Name: Maurice Lowe MRN: [id number] DOB: 04/01/1981 Today's Date: 09/03/2019    History of Present Illness Maurice Lowe is a 39 y.o. male with medical history significant of hepatocellular carcinoma, hepatitis B, history of non-Hodgkin lymphoma who presents with ongoing abdominal pain in setting malignancy and nausea/ vomiting.  Patient reports he has been dealing with pain for quite a while now and his oncology team has been titrating his pain medications but he still maintains that he has severe epigastric pain at times and on occasion to his left side.  He states that for the past 1 week he has been mostly sedentary because movement exacerbates the pain and he has to be somewhat of an incline position to tolerate.  He reports he is also not had any real food for over a week now and has been vomiting every 4 hours.  He denies any fevers, chills but reports that he has occasional hot flashes.  He does not report any particular trigger to this episode.  It is unclear if he filled his duloxetine and or some of the pain medication changes from his last visit were implemented.  He does report constipation and reports his last bowel movement was 4 days ago but also has not eaten much in over a week.   Clinical Impression   This 39 y/o male presents with the above. PTA pt reports completing ADL and mobility tasks independently, but recently requiring increased effort to perform, spouse assists with iADL tasks. Pt very pleasant and motivated to work with therapy. Pt tolerating room level mobility using RW with overall minA; he currently requires overall minA for ADL completion. Pt reports some abdominal pain but has improved since initial admit. He will benefit from continued acute OT services and currently recommend follow up [facility] services after discharge to maximize his overall strength, safety and independence with ADL and mobility. Will follow.      Follow Up Recommendations  Home health OT;Supervision/Assistance - 24 hour    Equipment Recommendations  None recommended by OT(pending progress)           Precautions / Restrictions Precautions Precautions: Fall Restrictions Weight Bearing Restrictions: No      Mobility Bed Mobility Overal bed mobility: Modified Independent             General bed mobility comments: increased time due to pain, use of bedrail and elevated HOB to come to EOB  Transfers Overall transfer level: Needs assistance Equipment used: Rolling walker (2 wheeled) Transfers: Sit to/from Stand Sit to Stand: Min assist;Min guard         General transfer comment: requires light boosting assist to rise from EOB; minguard for safety from toilet using grab bar    Balance Overall balance assessment: Needs assistance Sitting-balance support: Feet supported;No upper extremity supported Sitting balance-Leahy Scale: Good Sitting balance - Comments: seated EOB   Standing balance support: Bilateral upper extremity supported Standing balance-Leahy Scale: Poor Standing balance comment: pt reliant on UE support at this time                           ADL either performed or assessed with clinical judgement   ADL Overall ADL's : Needs assistance/impaired Eating/Feeding: Modified independent;Sitting   Grooming: Set up;Sitting;Wash/dry face;Oral care Grooming Details (indicate cue type and reason): seated at sink in bathroom Upper Body Bathing: Set up;Supervision/ safety;Sitting   Lower Body Bathing: Minimal assistance;Sit to/from stand  Upper Body Dressing : Set up;Sitting   Lower Body Dressing: Minimal assistance;Sit to/from stand   Toilet Transfer: Minimal assistance;Ambulation;RW;Grab bars   Toileting- Clothing Manipulation and Hygiene: Minimal assistance;Sit to/from stand       Functional mobility during ADLs: Minimal assistance;Rolling walker General ADL Comments: pt with  limitations due to weakness, decreased activity tolerance                         Pertinent Vitals/Pain Pain Assessment: Faces Pain Score: 7  Faces Pain Scale: Hurts little more Pain Location: abdomen with certain movements Pain Descriptors / Indicators: Discomfort Pain Intervention(s): Limited activity within patient's tolerance;Monitored during session;Repositioned     Hand Dominance     Extremity/Trunk Assessment Upper Extremity Assessment Upper Extremity Assessment: Generalized weakness   Lower Extremity Assessment Lower Extremity Assessment: Defer to PT evaluation   Cervical / Trunk Assessment Cervical / Trunk Assessment: Normal   Communication Communication Communication: No difficulties   Cognition Arousal/Alertness: Awake/alert Behavior During Therapy: WFL for tasks assessed/performed Overall Cognitive Status: Within Functional Limits for tasks assessed                                     General Comments       Exercises     Shoulder Instructions      Home Living Family/patient expects to be discharged to:: Private residence Living Arrangements: Spouse/significant other;Children(kids aged 76, 31, 70) Available Help at Discharge: Family;Available 24 hours/day Type of Home: House Home Access: Other (comment);Level entry(flight of stairs inside home)     Home Layout: One level     Bathroom Shower/Tub: Teacher, early years/pre: Standard     Home Equipment: Shower seat          Prior Functioning/Environment Level of Independence: Needs assistance  Gait / Transfers Assistance Needed: Pt reports spouse occasionally has to pull him out of bed/chair, assists him up/down steps using handrail. Pt ambulates limited household distances due to pain, no AD ADL's / Homemaking Assistance Needed: Pt reports he is able to bathe and dress independently, but it is very tiring. Pt reports spouse completes cooking, cleaning, and  driving.            OT Problem List: Decreased strength;Decreased range of motion;Decreased activity tolerance;Impaired balance (sitting and/or standing);Decreased knowledge of use of DME or AE;Pain      OT Treatment/Interventions: Self-care/ADL training;Therapeutic exercise;Energy conservation;DME and/or AE instruction;Therapeutic activities;Patient/family education;Balance training    OT Goals(Current goals can be found in the care plan section) Acute Rehab OT Goals Patient Stated Goal: back home with spouse/family to assist OT Goal Formulation: With patient Time For Goal Achievement: September 19, 2019 Potential to Achieve Goals: Good  OT Frequency: Min 2X/week   Barriers to D/C:            Co-evaluation              AM-PAC OT "6 Clicks" Daily Activity     Outcome Measure Help from another person eating meals?: None Help from another person taking care of personal grooming?: A Little Help from another person toileting, which includes using toliet, bedpan, or urinal?: A Little Help from another person bathing (including washing, rinsing, drying)?: A Little Help from another person to put on and taking off regular upper body clothing?: A Little Help from another person to put on and taking off regular lower body  clothing?: A Little 6 Click Score: 19   End of Session Equipment Utilized During Treatment: Gait belt;Rolling walker Nurse Communication: Mobility status  Activity Tolerance: Patient tolerated treatment well Patient left: in bed;with call bell/phone within reach  OT Visit Diagnosis: Muscle weakness (generalized) (M62.81);Unsteadiness on feet (R26.81)                Time: [id number] OT Time Calculation (min): 23 min Charges:  OT General Charges $OT Visit: 1 Visit OT Evaluation $OT Eval Moderate Complexity: Maurice Lowe, OT Acute Rehabilitation Services Pager [phone] Office [phone]   Maurice Lowe 09/03/2019, 2:00 PM

## 2019-09-03 NOTE — Progress Notes (Signed)
Maurice Lowe   DOB:07-30-1980   MW#:[id number]   UVO#:[id number]  Oncology follow up   Subjective: Patient did have bowel movement after enema 2 days ago.  Still complains significant abdominal pain, especially in the right upper quadrant, with position change.  Per his wife, he still has intermittent confusion.  His appetite is better, nausea also improved, no vomiting.  He has not been out of bed, still very weak, afebrile, no other new complaints.   Objective:  Vitals:   09/02/19 2101 09/03/19 0548  BP: 125/90 (!) 129/95  Pulse: (!) 103 97  Resp: 19 20  Temp: 98 F (36.7 C) 98.2 F (36.8 C)  SpO2: 99% 100%    Body mass index is 19.53 kg/m.  Intake/Output Summary (Last 24 hours) at 09/03/2019 0929 Last data filed at 09/03/2019 0326 Gross per 24 hour  Intake 1254.64 ml  Output 550 ml  Net 704.64 ml     Sclerae unicteric  Oropharynx clear  No peripheral adenopathy  Lungs clear -- no rales or rhonchi  Heart regular rate and rhythm  Abdomen soft, (+) tenderness in the right upper quadrant  MSK no focal spinal tenderness, no peripheral edema  Neuro nonfocal   CBG (last 3)  No results for input(s): GLUCAP in the last 72 hours.   Labs:  Urine Studies No results for input(s): UHGB, CRYS in the last 72 hours.  Invalid input(s): UACOL, UAPR, USPG, UPH, UTP, UGL, UKET, UBIL, UNIT, UROB, ULEU, UEPI, UWBC, URBC, UBAC, CAST, [place], [place]  Basic Metabolic Panel: Recent Labs  Lab 08/30/19 0848 08/30/19 0848 08/31/19 7425 08/31/19 9563 09/01/19 0540 09/01/19 0540 09/02/19 0533 09/03/19 0500  NA 128*  --  129*  --  132*  --  128* 128*  K 4.4   < > 4.4   < > 4.2   < > 3.9 4.0  CL 94*  --  101  --  101  --  98 101  CO2 25  --  21*  --  19*  --  22 21*  GLUCOSE 108*  --  87  --  83  --  89 99  BUN 15  --  10  --  11  --  10 8  CREATININE 0.70  --  0.53*  --  0.56*  --  0.50* 0.42*  CALCIUM 11.0*  --  9.6  --  10.1  --  10.1 10.1  MG  --   --   --   --  2.2  --  2.0 1.9   PHOS  --   --   --   --  1.1*  --  1.3* 1.3*   < > = values in this interval not displayed.   GFR Estimated Creatinine Clearance: 96.3 mL/min (A) (by C-G formula based on SCr of 0.42 mg/dL (L)). Liver Function Tests: Recent Labs  Lab 08/30/19 0848 08/31/19 0607 09/01/19 0540 09/02/19 0533 09/03/19 0500  AST 124* 143* 111* 102* 119*  ALT 26 26 25 24 31   ALKPHOS 129* 116 109 100 132*  BILITOT 0.9 1.1 1.0 0.7 0.7  PROT 10.0* 8.0 8.0 7.8 7.7  ALBUMIN 2.9* 2.3* 2.4* 2.2* 2.1*   No results for input(s): LIPASE, AMYLASE in the last 168 hours. No results for input(s): AMMONIA in the last 168 hours. Coagulation profile Recent Labs  Lab 09/01/19 0540  INR 1.4*    CBC: Recent Labs  Lab 08/30/19 0848 08/30/19 0848 08/31/19 8756 08/31/19 1723 09/01/19 0540 09/02/19 0533  09/03/19 0500  WBC 11.9*   < > 18.5* 16.7* 16.7* 16.3* 12.4*  NEUTROABS 9.2*  --   --  13.3* 13.5* 12.8* 9.4*  HGB 13.2   < > 11.7* 11.6* 11.1* 11.0* 11.0*  HCT 41.0   < > 36.6* 36.5* 34.7* 34.6* 34.7*  MCV 85.4   < > 85.5 86.5 85.3 84.8 85.3  PLT 333   < > 275 287 267 270 286   < > = values in this interval not displayed.   Cardiac Enzymes: No results for input(s): CKTOTAL, CKMB, CKMBINDEX, TROPONINI in the last 168 hours. BNP: Invalid input(s): POCBNP CBG: No results for input(s): GLUCAP in the last 168 hours. D-Dimer No results for input(s): DDIMER in the last 72 hours. Hgb A1c No results for input(s): HGBA1C in the last 72 hours. Lipid Profile No results for input(s): CHOL, HDL, LDLCALC, TRIG, CHOLHDL, LDLDIRECT in the last 72 hours. Thyroid function studies No results for input(s): TSH, T4TOTAL, T3FREE, THYROIDAB in the last 72 hours.  Invalid input(s): FREET3 Anemia work up Recent Labs    09/03/19 0500  VITAMINB12 1,223*  FOLATE 2.2*  FERRITIN 825*  TIBC 163*  IRON 32*  RETICCTPCT 2.3   Microbiology Recent Results (from the past 240 hour(s))  SARS CORONAVIRUS 2 (TAT 6-24 HRS)  Nasopharyngeal Nasopharyngeal Swab     Status: None   Collection Time: 08/30/19  5:02 PM   Specimen: Nasopharyngeal Swab  Result Value Ref Range Status   SARS Coronavirus 2 NEGATIVE NEGATIVE Final    Comment: (NOTE) SARS-CoV-2 target nucleic acids are NOT DETECTED. The SARS-CoV-2 RNA is generally detectable in upper and lower respiratory specimens during the acute phase of infection. Negative results do not preclude SARS-CoV-2 infection, do not rule out co-infections with other pathogens, and should not be used as the sole basis for treatment or other patient management decisions. Negative results must be combined with clinical observations, patient history, and epidemiological information. The expected result is Negative. Fact Sheet for Patients: SugarRoll.be Fact Sheet for Healthcare Providers: https://www.woods-mathews.com/ This test is not yet approved or cleared by the [place] FDA and  has been authorized for detection and/or diagnosis of SARS-CoV-2 by FDA under an Emergency Use Authorization (EUA). This EUA will remain  in effect (meaning this test can be used) for the duration of the COVID-19 declaration under Section 56 4(b)(1) of the Act, 21 U.S.C. section 360bbb-3(b)(1), unless the authorization is terminated or revoked sooner. Performed at [facility], No Name [address], [place], [place] [address]   Culture, Urine     Status: Abnormal   Collection Time: 08/31/19  4:37 PM   Specimen: Urine, Random  Result Value Ref Range Status   Specimen Description   Final    URINE, RANDOM Performed at [place] [address], [place], [place] [address]    Special Requests   Final    NONE Performed at [facility], [place] [address] [place], [place] [address]    Culture (A)  Final    <10,000 COLONIES/mL INSIGNIFICANT GROWTH Performed at [facility] [address], [place],  [place] [address]    Report Status 09/02/2019 FINAL  Final  Culture, blood (routine x 2)     Status: None (Preliminary result)   Collection Time: 08/31/19  5:23 PM   Specimen: BLOOD  Result Value Ref Range Status   Specimen Description   Final    BLOOD RIGHT ARM Performed at [facility], [place] [address] [place], [place] [address]    Special Requests   Final    BOTTLES DRAWN AEROBIC AND ANAEROBIC Blood Culture adequate volume Performed at [place] [address] [place], [place] [address]    Culture   Final    NO GROWTH 2 DAYS Performed at [place] [address], [place], [place] [address]    Report Status PENDING  Incomplete  Culture, blood (routine x 2)     Status: None (Preliminary result)   Collection Time: 08/31/19  5:23 PM   Specimen: BLOOD  Result Value Ref Range Status   Specimen Description   Final    BLOOD RIGHT HAND Performed at [place] [address], [place], [place] [address]    Special Requests   Final    BOTTLES DRAWN AEROBIC AND ANAEROBIC Blood Culture adequate volume Performed at [place] [address], [place], [place] [address]    Culture   Final    NO GROWTH 2 DAYS Performed at [place] [place] [address] [place],  [address]    Report Status PENDING  Incomplete      Studies:  CT HEAD WO CONTRAST  Result Date: 09/01/2019 CLINICAL DATA:  Mental status change, persistent or worsening. EXAM: CT HEAD WITHOUT CONTRAST TECHNIQUE: Contiguous axial images were obtained from the base of the skull through the vertex without intravenous contrast. COMPARISON:  No pertinent prior studies available for comparison. FINDINGS: Brain: There is no evidence of acute intracranial hemorrhage, intracranial mass, midline shift or extra-axial fluid collection.No demarcated cortical infarction. Incidentally noted cavum septum pellucidum and cavum vergae. Vascular: No  hyperdense vessel Skull: Normal. Negative for fracture or focal lesion. Sinuses/Orbits: Visualized orbits demonstrate no acute abnormality. No significant paranasal sinus disease or mastoid effusion at the imaged levels. IMPRESSION: No evidence of acute intracranial abnormality. Electronically Signed   By: Kellie Simmering DO   On: 09/01/2019 13:35   IR IMAGING GUIDED PORT INSERTION  Result Date: 09/01/2019 INDICATION: History of hepatocellular carcinoma. In need of durable intravenous access for chemotherapy administration. EXAM: IMPLANTED PORT A CATH PLACEMENT WITH ULTRASOUND AND FLUOROSCOPIC GUIDANCE COMPARISON:  PET-CT-08/08/2019 MEDICATIONS: Ancef 2 gm IV; The antibiotic was administered within an appropriate time interval prior to skin puncture. ANESTHESIA/SEDATION: Moderate (conscious) sedation was employed during this procedure. A total of Versed 2 mg and Fentanyl 100 mcg was administered intravenously. Moderate Sedation Time: 22 minutes. The patient's level of consciousness and vital signs were monitored continuously by radiology nursing throughout the procedure under my direct supervision. CONTRAST:  None FLUOROSCOPY TIME:  18 seconds (3 mGy) COMPLICATIONS: None immediate. PROCEDURE: The procedure, risks, benefits, and alternatives were explained to the patient. Questions regarding the procedure were encouraged and answered. The patient understands and consents to the procedure. The right neck and chest were prepped with chlorhexidine in a sterile fashion, and a sterile drape was applied covering the operative field. Maximum barrier sterile technique with sterile gowns and gloves were used for the procedure. A timeout was performed prior to the initiation of the procedure. Local anesthesia was provided with 1% lidocaine with epinephrine. After creating a small venotomy incision, a micropuncture kit was utilized to access the internal jugular vein. Real-time ultrasound guidance was utilized for vascular  access including the acquisition of a permanent ultrasound image documenting patency of the accessed vessel. The microwire was utilized to measure appropriate catheter length. A subcutaneous port pocket was then created along  the upper chest wall utilizing a combination of sharp and blunt dissection. The pocket was irrigated with sterile saline. A single lumen "Slim" sized power injectable port was chosen for placement. The 8 Fr catheter was tunneled from the port pocket site to the venotomy incision. The port was placed in the pocket. The external catheter was trimmed to appropriate length. At the venotomy, an 8 Fr peel-away sheath was placed over a guidewire under fluoroscopic guidance. The catheter was then placed through the sheath and the sheath was removed. Final catheter positioning was confirmed and documented with a fluoroscopic spot radiograph. The port was accessed with a Huber needle, aspirated and flushed with heparinized saline. The venotomy site was closed with an interrupted 4-0 Vicryl suture. The port pocket incision was closed with interrupted 2-0 Vicryl suture. Dermabond and Steri-strips were applied to both incisions. Dressings were applied. The patient tolerated the procedure well without immediate post procedural complication. FINDINGS: After catheter placement, the tip lies within the superior cavoatrial junction. The catheter aspirates and flushes normally and is ready for immediate use. IMPRESSION: Successful placement of a right internal jugular approach power injectable Port-A-Cath. The catheter is ready for immediate use. Electronically Signed   By: Sandi Mariscal M.D.   On: 09/01/2019 13:02    Assessment: 39 y.o.  1.  Metastatic hepatocellular carcinoma 2.  Abdominal pain secondary to [facility] and constipation 3.  Constipation 4.  Hypercalcemia of malignancy, resolved  5.  Leukocytosis 6.  Mild anemia 7.  Anorexia and weight loss 8.  Cirrhosis 7.  chronic Hepatitis B infection  8.  Hyponatremia 9.  Metabolic encephalopathy  Plan:  -I will increase his OxyContin to 20 mg q12h  -will repeat CT abd/pel with contrast to rule out other etiology of his abdominal pain and see if he has cancer progression in liver -will schedule his lactulose at least once a day, and as needed to have daily bowel movement -We will check ammonia level -I encourage him to participate PT -I will f/u    Truitt Merle, MD 09/03/2019  9:29 AM

## 2019-09-03 NOTE — Progress Notes (Signed)
PROGRESS NOTE    Jorgeluis Gurganus  ZES:[id number] DOB: 1981/02/27 DOA: 08/30/2019 PCP: Patient, No Pcp Per   Brief Narrative:  Nathanial Arrighi is a 39 y.o. male with medical history significant of hepatocellular carcinoma, hepatitis B, liver cirrhosis, history of non-Hodgkin lymphoma who presents with ongoing abdominal pain in setting malignancy and nausea/ vomiting.    Assessment & Plan:  Abdominal pain Nausea vomiting -Likely secondary to [facility] and constipation -Treated with supportive care, antiemetics, increasing dose of pain medicines -Plan for repeat CT with contrast today  Hepatocellular carcinoma Chronic hepatitis B Liver cirrhosis -Followed by oncology Dr. Burr Medico is currently on palliative immunotherapy on biologic and thus far has tolerated.  Tolerated first cycle of Tecentriq and Avastin moderately well -Continues to have worsening abdominal pain, increase OxyContin to 15 mg his pain regimen has been adjusted and he is currently on OxyContin 10 mg every 8 hours with oxycodone 5 mg PRN -Plan for CT as noted above  -Hypercalcemia -Mild, this is likely secondary to dehydration and not malignancy, resolved with hydration  Constipation -Secondary to narcotics -Continue current regimen of laxatives, and suppository, received an enema on 4/12  History of T-cell non-Hodgkin's lymphoma -Status post chemotherapy, no active issues  Normocytic Anemia -Stable  Hypophosphatemia  -Replete  Confusion and Hallucinations -Likely secondary to polypharmacy,?  Hepatic encephalopathy, check ammonia -CT unremarkable   DVT prophylaxis: Subcu heparin Code Status: FULL CODE  Family Communication: Discussed with patient and fianc at bedside Disposition Plan: Patient is from home, plan to discharge home when his nausea, abdominal pain is manageable  Consultants:   Medical Oncology  Palliative Care Medicine  Interventional Radiology    Procedures: Port-A-Cath placement to  be done in the morning  Antimicrobials:  Anti-infectives (From admission, onward)   Start     Dose/Rate Route Frequency Ordered Stop   09/01/19 1500  ceFAZolin (ANCEF) IVPB 2g/100 mL premix     2 g 200 mL/hr over 30 Minutes Intravenous To Radiology 08/31/19 1826 09/01/19 1240   08/31/19 1000  entecavir (BARACLUDE) tablet 0.5 mg     0.5 mg Oral Daily 08/30/19 1226       Subjective: -Complains of worsening pain, poor appetite, nausea, last bowel movement was 2 days ago after an enema, reports feeling constipated  Objective: Vitals:   09/02/19 0712 09/02/19 1352 09/02/19 2101 09/03/19 0548  BP: (!) 135/95 125/85 125/90 (!) 129/95  Pulse: 97 (!) 106 (!) 103 97  Resp: 15 19 19 20   Temp: 98.3 F (36.8 C) 97.7 F (36.5 C) 98 F (36.7 C) 98.2 F (36.8 C)  TempSrc: Oral Oral Oral Oral  SpO2: 99% 98% 99% 100%  Weight:      Height:        Intake/Output Summary (Last 24 hours) at 09/03/2019 1239 Last data filed at 09/03/2019 0326 Gross per 24 hour  Intake 1254.64 ml  Output 550 ml  Net 704.64 ml   Filed Weights   08/30/19 0813  Weight: 54.9 kg   Examination: Physical Exam:  Gen: Extremely thin cachectic African-American male, sitting up in bed, ill-appearing, AAO x2 HEENT: Positive icterus Lungs: Decreased breath sounds at the bases  CVS: S1-S2, regular rate rhythm Abd: Soft, right mild right-sided tenderness, bowel sounds diminished but present  extremities: Trace edema Skin: no new rashes  Data Reviewed: I have personally reviewed following labs and imaging studies  CBC: Recent Labs  Lab 08/30/19 0848 08/30/19 0848 08/31/19 2633 08/31/19 1723 09/01/19 0540 09/02/19 0533 09/03/19 0500  WBC 11.9*   < > 18.5* 16.7* 16.7* 16.3* 12.4*  NEUTROABS 9.2*  --   --  13.3* 13.5* 12.8* 9.4*  HGB 13.2   < > 11.7* 11.6* 11.1* 11.0* 11.0*  HCT 41.0   < > 36.6* 36.5* 34.7* 34.6* 34.7*  MCV 85.4   < > 85.5 86.5 85.3 84.8 85.3  PLT 333   < > 275 287 267 270 286   < > = values  in this interval not displayed.   Basic Metabolic Panel: Recent Labs  Lab 08/30/19 0848 08/31/19 0607 09/01/19 0540 09/02/19 0533 09/03/19 0500  NA 128* 129* 132* 128* 128*  K 4.4 4.4 4.2 3.9 4.0  CL 94* 101 101 98 101  CO2 25 21* 19* 22 21*  GLUCOSE 108* 87 83 89 99  BUN 15 10 11 10 8   CREATININE 0.70 0.53* 0.56* 0.50* 0.42*  CALCIUM 11.0* 9.6 10.1 10.1 10.1  MG  --   --  2.2 2.0 1.9  PHOS  --   --  1.1* 1.3* 1.3*   GFR: Estimated Creatinine Clearance: 96.3 mL/min (A) (by C-G formula based on SCr of 0.42 mg/dL (L)). Liver Function Tests: Recent Labs  Lab 08/30/19 0848 08/31/19 0607 09/01/19 0540 09/02/19 0533 09/03/19 0500  AST 124* 143* 111* 102* 119*  ALT 26 26 25 24 31   ALKPHOS 129* 116 109 100 132*  BILITOT 0.9 1.1 1.0 0.7 0.7  PROT 10.0* 8.0 8.0 7.8 7.7  ALBUMIN 2.9* 2.3* 2.4* 2.2* 2.1*   No results for input(s): LIPASE, AMYLASE in the last 168 hours. Recent Labs  Lab 09/03/19 1122  AMMONIA 60*   Coagulation Profile: Recent Labs  Lab 09/01/19 0540  INR 1.4*   Cardiac Enzymes: No results for input(s): CKTOTAL, CKMB, CKMBINDEX, TROPONINI in the last 168 hours. BNP (last 3 results) No results for input(s): PROBNP in the last 8760 hours. HbA1C: No results for input(s): HGBA1C in the last 72 hours. CBG: No results for input(s): GLUCAP in the last 168 hours. Lipid Profile: No results for input(s): CHOL, HDL, LDLCALC, TRIG, CHOLHDL, LDLDIRECT in the last 72 hours. Thyroid Function Tests: No results for input(s): TSH, T4TOTAL, FREET4, T3FREE, THYROIDAB in the last 72 hours. Anemia Panel: Recent Labs    09/03/19 0500  VITAMINB12 1,223*  FOLATE 2.2*  FERRITIN 825*  TIBC 163*  IRON 32*  RETICCTPCT 2.3   Sepsis Labs: No results for input(s): PROCALCITON, LATICACIDVEN in the last 168 hours.  Recent Results (from the past 240 hour(s))  SARS CORONAVIRUS 2 (TAT 6-24 HRS) Nasopharyngeal Nasopharyngeal Swab     Status: None   Collection Time: 08/30/19   5:02 PM   Specimen: Nasopharyngeal Swab  Result Value Ref Range Status   SARS Coronavirus 2 NEGATIVE NEGATIVE Final    Comment: (NOTE) SARS-CoV-2 target nucleic acids are NOT DETECTED. The SARS-CoV-2 RNA is generally detectable in upper and lower respiratory specimens during the acute phase of infection. Negative results do not preclude SARS-CoV-2 infection, do not rule out co-infections with other pathogens, and should not be used as the sole basis for treatment or other patient management decisions. Negative results must be combined with clinical observations, patient history, and epidemiological information. The expected result is Negative. Fact Sheet for Patients: SugarRoll.be Fact Sheet for Healthcare Providers: https://www.woods-mathews.com/ This test is not yet approved or cleared by the [place] FDA and  has been authorized for detection and/or diagnosis of SARS-CoV-2 by FDA under an Emergency Use Authorization (EUA). This EUA will  remain  in effect (meaning this test can be used) for the duration of the COVID-19 declaration under Section 56 4(b)(1) of the Act, 21 U.S.C. section 360bbb-3(b)(1), unless the authorization is terminated or revoked sooner. Performed at [facility], [place] [address], [place], [place] [address]   Culture, Urine     Status: Abnormal   Collection Time: 08/31/19  4:37 PM   Specimen: Urine, Random  Result Value Ref Range Status   Specimen Description   Final    URINE, RANDOM Performed at [place] [address], [place], [place] [address]    Special Requests   Final    NONE Performed at [facility], [place] [address], [place], [place] [address]    Culture (A)  Final    <10,000 COLONIES/mL INSIGNIFICANT GROWTH Performed at [place] [address] [place], [place] [address]    Report Status 09/02/2019 FINAL  Final  Culture, blood (routine x  2)     Status: None (Preliminary result)   Collection Time: 08/31/19  5:23 PM   Specimen: BLOOD  Result Value Ref Range Status   Specimen Description BLOOD RIGHT ARM  Final   Special Requests   Final    BOTTLES DRAWN AEROBIC AND ANAEROBIC Blood Culture adequate volume Performed at [facility] [address], [place], [place] [address]    Culture NO GROWTH 3 DAYS  Final   Report Status PENDING  Incomplete  Culture, blood (routine x 2)     Status: None (Preliminary result)   Collection Time: 08/31/19  5:23 PM   Specimen: BLOOD  Result Value Ref Range Status   Specimen Description BLOOD RIGHT HAND  Final   Special Requests   Final    BOTTLES DRAWN AEROBIC AND ANAEROBIC Blood Culture adequate volume Performed at [facility] [address], [place], [place] [address]    Culture NO GROWTH 3 DAYS  Final   Report Status PENDING  Incomplete     RN Pressure Injury Documentation:     Estimated body mass index is 19.53 kg/m as calculated from the following:   Height as of this encounter: 5' 6"  (1.676 m).   Weight as of this encounter: 54.9 kg.  Malnutrition Type:  Nutrition Problem: Increased nutrient needs Etiology: chronic illness, cancer and cancer related treatments   Malnutrition Characteristics:  Signs/Symptoms: estimated needs   Nutrition Interventions:  Interventions: MVI, Other (Comment)([place])  Radiology Studies: CT HEAD WO CONTRAST  Result Date: 09/01/2019 CLINICAL DATA:  Mental status change, persistent or worsening. EXAM: CT HEAD WITHOUT CONTRAST TECHNIQUE: Contiguous axial images were obtained from the base of the skull through the vertex without intravenous contrast. COMPARISON:  No pertinent prior studies available for comparison. FINDINGS: Brain: There is no evidence of acute intracranial hemorrhage, intracranial mass, midline shift or extra-axial fluid collection.No demarcated cortical infarction. Incidentally  noted cavum septum pellucidum and cavum vergae. Vascular: No hyperdense vessel Skull: Normal. Negative for fracture or focal lesion. Sinuses/Orbits: Visualized orbits demonstrate no acute abnormality. No significant paranasal sinus disease or mastoid effusion at the imaged levels. IMPRESSION: No evidence of acute intracranial abnormality. Electronically Signed   By: Kellie Simmering DO   On: 09/01/2019 13:35   IR IMAGING GUIDED PORT INSERTION  Result Date: 09/01/2019 INDICATION: History of hepatocellular carcinoma. In need of durable intravenous access for chemotherapy administration. EXAM: IMPLANTED PORT A CATH PLACEMENT WITH ULTRASOUND AND FLUOROSCOPIC GUIDANCE COMPARISON:  PET-CT-08/08/2019 MEDICATIONS: Ancef 2  gm IV; The antibiotic was administered within an appropriate time interval prior to skin puncture. ANESTHESIA/SEDATION: Moderate (conscious) sedation was employed during this procedure. A total of Versed 2 mg and Fentanyl 100 mcg was administered intravenously. Moderate Sedation Time: 22 minutes. The patient's level of consciousness and vital signs were monitored continuously by radiology nursing throughout the procedure under my direct supervision. CONTRAST:  None FLUOROSCOPY TIME:  18 seconds (3 mGy) COMPLICATIONS: None immediate. PROCEDURE: The procedure, risks, benefits, and alternatives were explained to the patient. Questions regarding the procedure were encouraged and answered. The patient understands and consents to the procedure. The right neck and chest were prepped with chlorhexidine in a sterile fashion, and a sterile drape was applied covering the operative field. Maximum barrier sterile technique with sterile gowns and gloves were used for the procedure. A timeout was performed prior to the initiation of the procedure. Local anesthesia was provided with 1% lidocaine with epinephrine. After creating a small venotomy incision, a micropuncture kit was utilized to access the internal jugular  vein. Real-time ultrasound guidance was utilized for vascular access including the acquisition of a permanent ultrasound image documenting patency of the accessed vessel. The microwire was utilized to measure appropriate catheter length. A subcutaneous port pocket was then created along the upper chest wall utilizing a combination of sharp and blunt dissection. The pocket was irrigated with sterile saline. A single lumen "Slim" sized power injectable port was chosen for placement. The 8 Fr catheter was tunneled from the port pocket site to the venotomy incision. The port was placed in the pocket. The external catheter was trimmed to appropriate length. At the venotomy, an 8 Fr peel-away sheath was placed over a guidewire under fluoroscopic guidance. The catheter was then placed through the sheath and the sheath was removed. Final catheter positioning was confirmed and documented with a fluoroscopic spot radiograph. The port was accessed with a Huber needle, aspirated and flushed with heparinized saline. The venotomy site was closed with an interrupted 4-0 Vicryl suture. The port pocket incision was closed with interrupted 2-0 Vicryl suture. Dermabond and Steri-strips were applied to both incisions. Dressings were applied. The patient tolerated the procedure well without immediate post procedural complication. FINDINGS: After catheter placement, the tip lies within the superior cavoatrial junction. The catheter aspirates and flushes normally and is ready for immediate use. IMPRESSION: Successful placement of a right internal jugular approach power injectable Port-A-Cath. The catheter is ready for immediate use. Electronically Signed   By: Sandi Mariscal M.D.   On: 09/01/2019 13:02   Scheduled Meds: . dronabinol  2.5 mg Oral BID AC  . DULoxetine  20 mg Oral Daily  . entecavir  0.5 mg Oral Daily  . feeding supplement ([employer] STANDARD 1.4)  325 mL Oral BID BM  . heparin  5,000 Units Subcutaneous Q12H  .  metoCLOPramide  5 mg Oral TID AC & HS  . multivitamin with minerals  1 tablet Oral Daily  . oxyCODONE  15 mg Oral Q8H  . polyethylene glycol  17 g Oral BID  . potassium & sodium phosphates  2 packet Oral Q2H  . senna-docusate  1 tablet Oral BID   Continuous Infusions:   LOS: 3 days   Domenic Polite, MD

## 2019-09-03 NOTE — Evaluation (Signed)
Physical Therapy Evaluation Patient Details Name: Maurice Lowe MRN: [id number] DOB: 07/31/1980 Today's Date: 09/03/2019   History of Present Illness  Maurice Lowe is a 39 y.o. male with medical history significant of hepatocellular carcinoma, hepatitis B, history of non-Hodgkin lymphoma who presents with ongoing abdominal pain in setting malignancy and nausea/ vomiting.  Patient reports he has been dealing with pain for quite a while now and his oncology team has been titrating his pain medications but he still maintains that he has severe epigastric pain at times and on occasion to his left side.  He states that for the past 1 week he has been mostly sedentary because movement exacerbates the pain and he has to be somewhat of an incline position to tolerate.  He reports he is also not had any real food for over a week now and has been vomiting every 4 hours.  He denies any fevers, chills but reports that he has occasional hot flashes.  He does not report any particular trigger to this episode.  It is unclear if he filled his duloxetine and or some of the pain medication changes from his last visit were implemented.  He does report constipation and reports his last bowel movement was 4 days ago but also has not eaten much in over a week.  Clinical Impression  Pt admitted with above diagnosis. Pt requiring increased time and supervision for mobility. Pt limited by pain and decreased endurance. Pt confident in returning home with family to assist due to family always being present; plan to practice stairs prior to leaving due to pt climbing flight of stairs to get into home. Pt currently with functional limitations due to the deficits listed below (see PT Problem List). Pt will benefit from skilled PT to increase their independence and safety with mobility to allow discharge to the venue listed below.       Follow Up Recommendations Home health PT;Supervision - Intermittent    Equipment  Recommendations  Rolling walker with 5" wheels    Recommendations for Other Services       Precautions / Restrictions Precautions Precautions: Fall Restrictions Weight Bearing Restrictions: No      Mobility  Bed Mobility Overal bed mobility: Modified Independent             General bed mobility comments: increased time due to pain, use of bedrail and elevated HOB to come to EOB  Transfers Overall transfer level: Needs assistance Equipment used: Rolling walker (2 wheeled) Transfers: Sit to/from Stand Sit to Stand: Supervision         General transfer comment: cues to push from EOB and avoid pulling on RW to rise, slow to rise from seated surface, no unsteadiness noted  Ambulation/Gait Ambulation/Gait assistance: Supervision Gait Distance (Feet): 60 Feet Assistive device: Rolling walker (2 wheeled) Gait Pattern/deviations: Step-through pattern;Decreased stride length;Narrow base of support Gait velocity: decreased   General Gait Details: slow steps in hallway, initially flexed over RW due to pain but able to extend upright, limited secondary to c/o fatigue, no loss of balance and good RW management  Stairs            Wheelchair Mobility    Modified Rankin (Stroke Patients Only)       Balance Overall balance assessment: Needs assistance Sitting-balance support: Feet supported;No upper extremity supported Sitting balance-Leahy Scale: Good Sitting balance - Comments: seated EOB   Standing balance support: During functional activity;Bilateral upper extremity supported Standing balance-Leahy Scale: Fair Standing balance comment:  with RW                  Pertinent Vitals/Pain Pain Assessment: 0-10 Pain Score: 7  Pain Location: abdomen and R side with deep inhale Pain Descriptors / Indicators: Aching;Grimacing Pain Intervention(s): Limited activity within patient's tolerance;Monitored during session;RN gave pain meds during session;Repositioned     Home Living Family/patient expects to be discharged to:: Private residence Living Arrangements: Spouse/significant other;Children(kids aged 43, 59, 57) Available Help at Discharge: Family;Available 24 hours/day Type of Home: House Home Access: Other (comment);Level entry(flight of stairs inside home)     Home Layout: One level Home Equipment: None      Prior Function Level of Independence: Needs assistance   Gait / Transfers Assistance Needed: Pt reports spouse occasionally has to pull him out of bed/chair, assists him up/down steps using handrail. Pt ambulates limited household distances due to pain, no AD  ADL's / Homemaking Assistance Needed: Pt reports he is able to bathe and dress independently, but it is very tiring. Pt reports spouse completes cooking, cleaning, and driving.        Hand Dominance        Extremity/Trunk Assessment   Upper Extremity Assessment Upper Extremity Assessment: Defer to OT evaluation    Lower Extremity Assessment Lower Extremity Assessment: Overall WFL for tasks assessed(BLE AROM WNL, 4/5 strength)    Cervical / Trunk Assessment Cervical / Trunk Assessment: Normal  Communication   Communication: No difficulties  Cognition Arousal/Alertness: Awake/alert Behavior During Therapy: WFL for tasks assessed/performed Overall Cognitive Status: Within Functional Limits for tasks assessed                                        General Comments      Exercises     Assessment/Plan    PT Assessment Patient needs continued PT services  PT Problem List Decreased strength;Decreased activity tolerance;Decreased balance;Decreased mobility;Decreased knowledge of use of DME;Pain       PT Treatment Interventions DME instruction;Gait training;Stair training;Functional mobility training;Therapeutic activities;Therapeutic exercise;Balance training;Neuromuscular re-education;Patient/family education    PT Goals (Current goals can  be found in the Care Plan section)  Acute Rehab PT Goals Patient Stated Goal: back home with spouse/family to assist PT Goal Formulation: With patient/family Time For Goal Achievement: 09/10/19 Potential to Achieve Goals: Good    Frequency Min 3X/week   Barriers to discharge        Co-evaluation               AM-PAC PT "6 Clicks" Mobility  Outcome Measure Help needed turning from your back to your side while in a flat bed without using bedrails?: None Help needed moving from lying on your back to sitting on the side of a flat bed without using bedrails?: None Help needed moving to and from a bed to a chair (including a wheelchair)?: A Little Help needed standing up from a chair using your arms (e.g., wheelchair or bedside chair)?: A Little Help needed to walk in hospital room?: A Little Help needed climbing 3-5 steps with a railing? : A Little 6 Click Score: 20    End of Session Equipment Utilized During Treatment: Gait belt Activity Tolerance: Patient tolerated treatment well;Patient limited by fatigue;Patient limited by pain Patient left: in bed;with call bell/phone within reach;with family/visitor present(seated EOB to eat breakfast with spouse in bedside chair) Nurse Communication: Mobility status PT Visit Diagnosis:  Unsteadiness on feet (R26.81);Other abnormalities of gait and mobility (R26.89);Pain Pain - Right/Left: Right Pain - part of body: (abdomen, side)    Time: 1012-1030 PT Time Calculation (min) (ACUTE ONLY): 18 min   Charges:   PT Evaluation $PT Eval Low Complexity: 1 Low           Tori Keri Tavella PT, DPT 09/03/19, 12:33 PM [phone]

## 2019-09-04 ENCOUNTER — Inpatient Hospital Stay: Payer: BC Managed Care – PPO | Admitting: Nutrition

## 2019-09-04 ENCOUNTER — Inpatient Hospital Stay: Payer: BC Managed Care – PPO

## 2019-09-04 ENCOUNTER — Inpatient Hospital Stay: Payer: BC Managed Care – PPO | Admitting: Hematology

## 2019-09-04 DIAGNOSIS — K5900 Constipation, unspecified: Secondary | ICD-10-CM | POA: Diagnosis not present

## 2019-09-04 DIAGNOSIS — C220 Liver cell carcinoma: Secondary | ICD-10-CM | POA: Diagnosis not present

## 2019-09-04 LAB — CBC
HCT: 36.6 % — ABNORMAL LOW (ref 39.0–52.0)
Hemoglobin: 11.7 g/dL — ABNORMAL LOW (ref 13.0–17.0)
MCH: 27.4 pg (ref 26.0–34.0)
MCHC: 32 g/dL (ref 30.0–36.0)
MCV: 85.7 fL (ref 80.0–100.0)
Platelets: 315 10*3/uL (ref 150–400)
RBC: 4.27 MIL/uL (ref 4.22–5.81)
RDW: 14.4 % (ref 11.5–15.5)
WBC: 11.2 10*3/uL — ABNORMAL HIGH (ref 4.0–10.5)
nRBC: 0 % (ref 0.0–0.2)

## 2019-09-04 LAB — COMPREHENSIVE METABOLIC PANEL
ALT: 36 U/L (ref 0–44)
AST: 128 U/L — ABNORMAL HIGH (ref 15–41)
Albumin: 2.3 g/dL — ABNORMAL LOW (ref 3.5–5.0)
Alkaline Phosphatase: 141 U/L — ABNORMAL HIGH (ref 38–126)
Anion gap: 8 (ref 5–15)
BUN: 10 mg/dL (ref 6–20)
CO2: 22 mmol/L (ref 22–32)
Calcium: 11.2 mg/dL — ABNORMAL HIGH (ref 8.9–10.3)
Chloride: 101 mmol/L (ref 98–111)
Creatinine, Ser: 0.53 mg/dL — ABNORMAL LOW (ref 0.61–1.24)
GFR calc Af Amer: >60 mL/min (ref >60)
GFR calc non Af Amer: >60 mL/min (ref >60)
Glucose, Bld: 101 mg/dL — ABNORMAL HIGH (ref 70–99)
Potassium: 4.7 mmol/L (ref 3.5–5.1)
Sodium: 131 mmol/L — ABNORMAL LOW (ref 135–145)
Total Bilirubin: 0.9 mg/dL (ref 0.3–1.2)
Total Protein: 8.7 g/dL — ABNORMAL HIGH (ref 6.5–8.1)

## 2019-09-04 MED ORDER — LACTULOSE 10 GM/15ML PO SOLN
30.0000 g | Freq: Three times a day (TID) | ORAL | Status: DC
  Administered 2019-09-04 – 2019-09-08 (×10): 30 g via ORAL
  Filled 2019-09-04 (×10): qty 45

## 2019-09-04 MED ORDER — ALUM & MAG HYDROXIDE-SIMETH 200-200-20 MG/5ML PO SUSP
30.0000 mL | ORAL | Status: DC | PRN
  Administered 2019-09-04: 20:00:00 30 mL via ORAL

## 2019-09-04 MED ORDER — SENNOSIDES-DOCUSATE SODIUM 8.6-50 MG PO TABS
2.0000 | ORAL_TABLET | Freq: Two times a day (BID) | ORAL | Status: DC
  Administered 2019-09-04 – 2019-09-08 (×6): 2 via ORAL
  Filled 2019-09-04 (×6): qty 2

## 2019-09-04 MED ORDER — LACTULOSE 10 GM/15ML PO SOLN: 20.0000 g | Freq: Three times a day (TID) | ORAL | Status: DC

## 2019-09-04 MED ORDER — ZOLEDRONIC ACID 4 MG/5ML IV CONC
4.0000 mg | Freq: Once | INTRAVENOUS | Status: AC
  Administered 2019-09-04: 4 mg via INTRAVENOUS
  Filled 2019-09-04: qty 5

## 2019-09-04 MED ORDER — CHLORHEXIDINE GLUCONATE CLOTH 2 % EX PADS
6.0000 | MEDICATED_PAD | Freq: Every day | CUTANEOUS | Status: DC
  Administered 2019-09-04 – 2019-09-08 (×3): 6 via TOPICAL

## 2019-09-04 NOTE — Progress Notes (Addendum)
Maurice Lowe   DOB:11/30/1980   [id number]   [id number]  Oncology follow up   Subjective: Had a small bowel movement last evening.  Still having abdominal discomfort.  Still having intermittent confusion.  No nausea or vomiting reported.  Objective:  Vitals:   09/04/19 0735 09/04/19 0800  BP: (!) 124/95   Pulse: (!) 114 96  Resp: 17   Temp: 98 F (36.7 C)   SpO2: 100%     Body mass index is 19.53 kg/m.  Intake/Output Summary (Last 24 hours) at 09/04/2019 1029 Last data filed at 09/04/2019 0618 Gross per 24 hour  Intake 480 ml  Output 300 ml  Net 180 ml     Sclerae unicteric  Oropharynx clear  No peripheral adenopathy  Lungs clear -- no rales or rhonchi  Heart regular rate and rhythm  Abdomen soft, (+) tenderness in the right upper quadrant  MSK no focal spinal tenderness, no peripheral edema  Neuro nonfocal   CBG (last 3)  No results for input(s): GLUCAP in the last 72 hours.   Labs:  Urine Studies No results for input(s): UHGB, CRYS in the last 72 hours.  Invalid input(s): UACOL, UAPR, USPG, UPH, UTP, UGL, [place], UBIL, UNIT, UROB, [place], UEPI, UWBC, URBC, UBAC, CAST, [place], [place]  Basic Metabolic Panel: Recent Labs  Lab 08/31/19 [phone] 08/31/19 0607 09/01/19 0540 09/01/19 0540 09/02/19 0533 09/02/19 0533 09/03/19 0500 09/04/19 0522  NA 129*  --  132*  --  128*  --  128* 131*  K 4.4   < > 4.2   < > 3.9   < > 4.0 4.7  CL 101  --  101  --  98  --  101 101  CO2 21*  --  19*  --  22  --  21* 22  GLUCOSE 87  --  83  --  89  --  99 101*  BUN 10  --  11  --  10  --  8 10  CREATININE 0.53*  --  0.56*  --  0.50*  --  0.42* 0.53*  CALCIUM 9.6  --  10.1  --  10.1  --  10.1 11.2*  MG  --   --  2.2  --  2.0  --  1.9  --   PHOS  --   --  1.1*  --  1.3*  --  1.3*  --    < > = values in this interval not displayed.   GFR Estimated Creatinine Clearance: 96.3 mL/min (A) (by C-G formula based on SCr of 0.53 mg/dL (L)). Liver Function Tests: Recent Labs  Lab  08/31/19 0607 09/01/19 0540 09/02/19 0533 09/03/19 0500 09/04/19 0522  AST 143* 111* 102* 119* 128*  ALT 26 25 24 31  36  ALKPHOS 116 109 100 132* 141*  BILITOT 1.1 1.0 0.7 0.7 0.9  PROT 8.0 8.0 7.8 7.7 8.7*  ALBUMIN 2.3* 2.4* 2.2* 2.1* 2.3*   No results for input(s): LIPASE, AMYLASE in the last 168 hours. Recent Labs  Lab 09/03/19 1122  AMMONIA 60*   Coagulation profile Recent Labs  Lab 09/01/19 0540  INR 1.4*    CBC: Recent Labs  Lab 08/30/19 0848 08/31/19 0607 08/31/19 1723 09/01/19 0540 09/02/19 0533 09/03/19 0500 09/04/19 0522  WBC 11.9*   < > 16.7* 16.7* 16.3* 12.4* 11.2*  NEUTROABS 9.2*  --  13.3* 13.5* 12.8* 9.4*  --   HGB 13.2   < > 11.6* 11.1* 11.0* 11.0* 11.7*  HCT 41.0   < > 36.5* 34.7* 34.6* 34.7* 36.6*  MCV 85.4   < > 86.5 85.3 84.8 85.3 85.7  PLT 333   < > 287 267 270 286 315   < > = values in this interval not displayed.   Cardiac Enzymes: No results for input(s): CKTOTAL, CKMB, CKMBINDEX, TROPONINI in the last 168 hours. BNP: Invalid input(s): POCBNP CBG: No results for input(s): GLUCAP in the last 168 hours. D-Dimer No results for input(s): DDIMER in the last 72 hours. Hgb A1c No results for input(s): HGBA1C in the last 72 hours. Lipid Profile No results for input(s): CHOL, HDL, LDLCALC, TRIG, CHOLHDL, LDLDIRECT in the last 72 hours. Thyroid function studies No results for input(s): TSH, T4TOTAL, T3FREE, THYROIDAB in the last 72 hours.  Invalid input(s): FREET3 Anemia work up Recent Labs    09/03/19 0500  VITAMINB12 1,223*  FOLATE 2.2*  FERRITIN 825*  TIBC 163*  IRON 32*  RETICCTPCT 2.3   Microbiology Recent Results (from the past 240 hour(s))  SARS CORONAVIRUS 2 (TAT 6-24 HRS) Nasopharyngeal Nasopharyngeal Swab     Status: None   Collection Time: 08/30/19  5:02 PM   Specimen: Nasopharyngeal Swab  Result Value Ref Range Status   SARS Coronavirus 2 NEGATIVE NEGATIVE Final    Comment: (NOTE) SARS-CoV-2 target nucleic acids  are NOT DETECTED. The SARS-CoV-2 RNA is generally detectable in upper and lower respiratory specimens during the acute phase of infection. Negative results do not preclude SARS-CoV-2 infection, do not rule out co-infections with other pathogens, and should not be used as the sole basis for treatment or other patient management decisions. Negative results must be combined with clinical observations, patient history, and epidemiological information. The expected result is Negative. Fact Sheet for Patients: SugarRoll.be Fact Sheet for Healthcare Providers: https://www.woods-mathews.com/ This test is not yet approved or cleared by the [place] FDA and  has been authorized for detection and/or diagnosis of SARS-CoV-2 by FDA under an Emergency Use Authorization (EUA). This EUA will remain  in effect (meaning this test can be used) for the duration of the COVID-19 declaration under Section 56 4(b)(1) of the Act, 21 U.S.C. section 360bbb-3(b)(1), unless the authorization is terminated or revoked sooner. Performed at [facility], [place] [address] [place], [place] [address]   Culture, Urine     Status: Abnormal   Collection Time: 08/31/19  4:37 PM   Specimen: Urine, Random  Result Value Ref Range Status   Specimen Description   Final    URINE, RANDOM Performed at [facility] [address], [place], [place] [address]    Special Requests   Final    NONE Performed at [facility], [place] [address], [place], [place] [address]    Culture (A)  Final    <10,000 COLONIES/mL INSIGNIFICANT GROWTH Performed at [facility] [address] [place], [place] [address]    Report Status 09/02/2019 FINAL  Final  Culture, blood (routine x 2)     Status: None (Preliminary result)   Collection Time: 08/31/19  5:23 PM   Specimen: BLOOD  Result Value Ref Range Status   Specimen Description BLOOD RIGHT ARM   Final   Special Requests   Final    BOTTLES DRAWN AEROBIC AND ANAEROBIC Blood Culture adequate volume Performed at [facility] [address], [place], [place] [address]    Culture NO GROWTH 3 DAYS  Final   Report Status PENDING  Incomplete  Culture, blood (routine x 2)     Status: None (Preliminary result)   Collection Time: 08/31/19  5:23 PM   Specimen: BLOOD  Result Value Ref Range Status   Specimen Description BLOOD RIGHT HAND  Final   Special Requests   Final    BOTTLES DRAWN AEROBIC AND ANAEROBIC Blood Culture adequate volume Performed at [facility] [address], [place], [place] [address]    Culture NO GROWTH 3 DAYS  Final   Report Status PENDING  Incomplete      Studies:  CT ABDOMEN PELVIS W CONTRAST  Result Date: 09/03/2019 CLINICAL DATA:  Chronic hepatitis B infection. Hepatoma. Right lower quadrant pain EXAM: CT ABDOMEN AND PELVIS WITH CONTRAST TECHNIQUE: Multidetector CT imaging of the abdomen and pelvis was performed using the standard protocol following bolus administration of intravenous contrast. CONTRAST:  142mL OMNIPAQUE IOHEXOL 300 MG/ML  SOLN COMPARISON:  MRI abdomen 07/25/2019 FINDINGS: Lower chest: New atelectasis and airspace disease identified within the lung bases, right greater than left. Trace right pleural fluid. Right CP angle lymph node is enlarged measuring 1.2 cm, image 14/2. Previously this measured 1 cm. Hepatobiliary: Innumerable, progressive lesions are identified throughout the liver. Lesions appear more confluent and increased in size. Confluent mass involving the lateral segment of left lobe of liver measures 4.0 x 3.2 cm, image 34/2. Previously this measured 3.1 x 2.7 cm. Gallbladder unremarkable. No gallstones identified. No biliary ductal dilatation. Pancreas: Unremarkable. No pancreatic ductal dilatation or surrounding inflammatory changes. Spleen: Normal in size without focal abnormality.  Adrenals/Urinary Tract: Normal adrenal glands. No kidney mass or hydronephrosis identified. Urinary bladder unremarkable. Stomach/Bowel: Stomach is nondistended. The small bowel loops are normal. No small bowel wall thickening, inflammation or distension. The appendix is unremarkable. There is a large stool burden identified throughout the colon particularly within the right colon including the cecum. Findings may reflect underlying constipation. No pathologic dilatation of the colon. Vascular/Lymphatic: Large low-density lymph node in the peripancreatic region measures 3.2 x 1.9 cm, image 34/2. Previously 2.5 x 0.8 cm. Aortocaval lymph node posterior to head of pancreas measures 2.0 x 1.0 cm, image 38/2. Previously 0.9 x 0.6 cm. Porta hepatic node measures 1.4 x 1.9 cm, image 31/2. Previously 1.4 x 1.2 cm left retroperitoneal node measures 0.9 cm, image 47/2. New from previous exam. Reproductive: Prostate is unremarkable. Other: There is a small volume of ascites within the abdomen and pelvis. New from previous study. Musculoskeletal: No acute or significant osseous findings. IMPRESSION: 1. Extensive tumor burden within the liver is progressive when compared with 07/25/2019. 2. New upper abdominal metastatic adenopathy. 3. Large stool burden identified throughout the colon particularly within the right colon including the cecum. Findings may reflect underlying constipation. 4. Trace right pleural fluid and small volume of ascites. New from previous exam 5. New atelectasis and airspace disease identified within the lung bases, right greater than left. Electronically Signed   By: Kerby Moors M.D.   On: 09/03/2019 15:56    Assessment: 39 y.o.  1.  Metastatic hepatocellular carcinoma 2.  Abdominal pain secondary to [facility] and constipation 3.  Constipation 4.  Hypercalcemia of malignancy 5.  Leukocytosis 6.  Mild anemia 7.  Anorexia and weight loss 8.  Cirrhosis 7.  chronic Hepatitis B infection  8.  Hyponatremia 9.  Metabolic encephalopathy  Plan:  -CT scan results discussed with the patient and his family member today.  CT showed progressive disease.  We discussed that we will continue his current systemic  treatment and switch him to chemotherapy.  This will be done as an outpatient. -Increase lactulose to 3 times a day to treat constipation and elevated ammonia level.  Goal would be to have 3-5 bowel movements per day. -Continue current pain medications. -Calcium level has increased again today.  Agree with Zometa as already ordered by hospitalist. -We will continue to follow with you.  Mikey Bussing, NP 09/04/2019  10:29 AM  Addendum  I have seen the patient, examined him. I agree with the assessment and and plan and have edited the notes.   Pt's pain is better controlled, he is drowsy, has intermittent confusion.  He was sitting in the recliner when I saw him, wife at bedside.  I reviewed his CT scan from yesterday, unfortunately showed significant disease progression in liver and abdominal adenopathy.  We discussed other treatment option, and I recommend switching to chemotherapy gemcitabine and oxaliplatin.  If he is able to go home this week, I will plan to start next week.   Unfortunately his overall condition and performance status has decreased significantly, his overall prognosis is very poor.  I discussed with patient and his wife, she was tearful, not sure how much patient was able to comprehend. We also discussed CODE STATUS and living well, I encouraged him to consider DNR, he will think about it.   He has not had a bowel movement for 3 days, ammonia level was up yesterday, I spoke with his nurse, will give lactulose every 4 hours until he has bowel movement.  Truitt Merle  09/04/2019

## 2019-09-04 NOTE — Progress Notes (Signed)
PROGRESS NOTE    Maurice Lowe  [id number] DOB: [birth year] DOA: 08/30/2019 PCP: Patient, No Pcp Per   Brief Narrative:  Maurice Lowe is a 39 y.o. male with medical history significant of hepatocellular carcinoma, chronic hepatitis B, liver cirrhosis, history of non-Hodgkin lymphoma who presented with ongoing abdominal pain in setting malignancy and nausea/ vomiting.    Assessment & Plan:  Worsening abdominal pain Nausea vomiting -Secondary to [facility] and constipation -Treated with supportive care, antiemetics, increasing dose of pain medicines -Repeat CT abdomen 4/14 noted extensive tumor burden within the liver is progressive compared to imaging in March and new upper abdominal metastatic adenopathy noted -OxyContin dose increased again to 20 mg every 8, limited by drowsiness  Hepatocellular carcinoma Chronic hepatitis B Liver cirrhosis Hepatic encephalopathy -Followed by oncology Dr. Burr Medico is currently on palliative immunotherapy on biologic and thus far has tolerated.  Tolerated first cycle of Tecentriq and Avastin moderately well -Narcotic dose increased as above -Repeat CT as noted above with worsening extensive tumor burden -Increase lactulose dose  -Hypercalcemia -Had initially resolved with hydration, now with worsening, will give Zometa x1  Constipation -Secondary to narcotics -Continue Senokot, MiraLAX, lactulose dosing -Continue current regimen of laxatives, and suppository, received an enema on 4/12  History of T-cell non-Hodgkin's lymphoma -Status post chemotherapy, no active issues  Normocytic Anemia -Stable  Hypophosphatemia  -Replete  Confusion and Hallucinations -Likely secondary to polypharmacy,?  Hepatic encephalopathy, ammonia level is 60 -CT unremarkable  -continue to lactulose  DVT prophylaxis: Subcu heparin Code Status: FULL CODE  Family Communication: Discussed with patient and fianc at bedside Disposition Plan: Patient is from  home, plan to discharge home when his nausea, abdominal pain is manageable and mental status is stable  Consultants:   Medical Oncology  Palliative Care Medicine  Interventional Radiology    Procedures: Port-A-Cath placement to be done in the morning  Antimicrobials:  Anti-infectives (From admission, onward)   Start     Dose/Rate Route Frequency Ordered Stop   09/01/19 1500  ceFAZolin (ANCEF) IVPB 2g/100 mL premix     2 g 200 mL/hr over 30 Minutes Intravenous To Radiology 08/31/19 1826 09/01/19 1240   08/31/19 1000  entecavir (BARACLUDE) tablet 0.5 mg     0.5 mg Oral Daily 08/30/19 1226       Subjective: -Continues to have abdominal pain, very poor p.o. intake -Patient fell last night  Objective: Vitals:   09/04/19 0325 09/04/19 0735 09/04/19 0800 09/04/19 1136  BP: (!) 140/99 (!) 124/95  (!) 131/93  Pulse: (!) 109 (!) 114 96 (!) 104  Resp: 16 17  18   Temp: 98.5 F (36.9 C) 98 F (36.7 C)  97.8 F (36.6 C)  TempSrc: Oral Oral  Oral  SpO2: 100% 100%  100%  Weight:      Height:        Intake/Output Summary (Last 24 hours) at 09/04/2019 1324 Last data filed at 09/04/2019 [id number] Gross per 24 hour  Intake 480 ml  Output 300 ml  Net 180 ml   Filed Weights   08/30/19 0813  Weight: 54.9 kg   Examination: Physical Exam:  Gen: Extremely thin cachectic African-American male sitting up in bed, ill appearing, AAOx2 HEENT: positive icterus Lungs: decreased BS at bases CVS: S1-S2, regular rhythm Abd: Soft, mild right-sided tenderness, nondistended, bowel sounds diminished but present  extremities: No edema Skin: no new rashes on exposed skin   Data Reviewed: I have personally reviewed following labs and imaging studies  CBC:  Recent Labs  Lab 08/30/19 0848 08/31/19 0607 08/31/19 1723 09/01/19 0540 09/02/19 0533 09/03/19 0500 09/04/19 0522  WBC 11.9*   < > 16.7* 16.7* 16.3* 12.4* 11.2*  NEUTROABS 9.2*  --  13.3* 13.5* 12.8* 9.4*  --   HGB 13.2   < > 11.6*  11.1* 11.0* 11.0* 11.7*  HCT 41.0   < > 36.5* 34.7* 34.6* 34.7* 36.6*  MCV 85.4   < > 86.5 85.3 84.8 85.3 85.7  PLT 333   < > 287 267 270 286 315   < > = values in this interval not displayed.   Basic Metabolic Panel: Recent Labs  Lab 08/31/19 0607 09/01/19 0540 09/02/19 0533 09/03/19 0500 09/04/19 0522  NA 129* 132* 128* 128* 131*  K 4.4 4.2 3.9 4.0 4.7  CL 101 101 98 101 101  CO2 21* 19* 22 21* 22  GLUCOSE 87 83 89 99 101*  BUN 10 11 10 8 10   CREATININE 0.53* 0.56* 0.50* 0.42* 0.53*  CALCIUM 9.6 10.1 10.1 10.1 11.2*  MG  --  2.2 2.0 1.9  --   PHOS  --  1.1* 1.3* 1.3*  --    GFR: Estimated Creatinine Clearance: 96.3 mL/min (A) (by C-G formula based on SCr of 0.53 mg/dL (L)). Liver Function Tests: Recent Labs  Lab 08/31/19 0607 09/01/19 0540 09/02/19 0533 09/03/19 0500 09/04/19 0522  AST 143* 111* 102* 119* 128*  ALT 26 25 24 31  36  ALKPHOS 116 109 100 132* 141*  BILITOT 1.1 1.0 0.7 0.7 0.9  PROT 8.0 8.0 7.8 7.7 8.7*  ALBUMIN 2.3* 2.4* 2.2* 2.1* 2.3*   No results for input(s): LIPASE, AMYLASE in the last 168 hours. Recent Labs  Lab 09/03/19 1122  AMMONIA 60*   Coagulation Profile: Recent Labs  Lab 09/01/19 0540  INR 1.4*   Cardiac Enzymes: No results for input(s): CKTOTAL, CKMB, CKMBINDEX, TROPONINI in the last 168 hours. BNP (last 3 results) No results for input(s): PROBNP in the last 8760 hours. HbA1C: No results for input(s): HGBA1C in the last 72 hours. CBG: No results for input(s): GLUCAP in the last 168 hours. Lipid Profile: No results for input(s): CHOL, HDL, LDLCALC, TRIG, CHOLHDL, LDLDIRECT in the last 72 hours. Thyroid Function Tests: No results for input(s): TSH, T4TOTAL, FREET4, T3FREE, THYROIDAB in the last 72 hours. Anemia Panel: Recent Labs    09/03/19 0500  VITAMINB12 1,223*  FOLATE 2.2*  FERRITIN 825*  TIBC 163*  IRON 32*  RETICCTPCT 2.3   Sepsis Labs: No results for input(s): PROCALCITON, LATICACIDVEN in the last 168  hours.  Recent Results (from the past 240 hour(s))  SARS CORONAVIRUS 2 (TAT 6-24 HRS) Nasopharyngeal Nasopharyngeal Swab     Status: None   Collection Time: 08/30/19  5:02 PM   Specimen: Nasopharyngeal Swab  Result Value Ref Range Status   SARS Coronavirus 2 NEGATIVE NEGATIVE Final    Comment: (NOTE) SARS-CoV-2 target nucleic acids are NOT DETECTED. The SARS-CoV-2 RNA is generally detectable in upper and lower respiratory specimens during the acute phase of infection. Negative results do not preclude SARS-CoV-2 infection, do not rule out co-infections with other pathogens, and should not be used as the sole basis for treatment or other patient management decisions. Negative results must be combined with clinical observations, patient history, and epidemiological information. The expected result is Negative. Fact Sheet for Patients: SugarRoll.be Fact Sheet for Healthcare Providers: https://www.woods-mathews.com/ This test is not yet approved or cleared by the [place] FDA and  has  been authorized for detection and/or diagnosis of SARS-CoV-2 by FDA under an Emergency Use Authorization (EUA). This EUA will remain  in effect (meaning this test can be used) for the duration of the COVID-19 declaration under Section 56 4(b)(1) of the Act, 21 U.S.C. section 360bbb-3(b)(1), unless the authorization is terminated or revoked sooner. Performed at [facility], [place] [address], [place], [place] [address]   Culture, Urine     Status: Abnormal   Collection Time: 08/31/19  4:37 PM   Specimen: Urine, Random  Result Value Ref Range Status   Specimen Description   Final    URINE, RANDOM Performed at [facility] [address], [place], [place] [address]    Special Requests   Final    NONE Performed at [facility], [place] [address], [place], [place] [address]    Culture (A)  Final    <10,000 COLONIES/mL  INSIGNIFICANT GROWTH Performed at [facility] [address], [place], [place] [address]    Report Status 09/02/2019 FINAL  Final  Culture, blood (routine x 2)     Status: None (Preliminary result)   Collection Time: 08/31/19  5:23 PM   Specimen: BLOOD  Result Value Ref Range Status   Specimen Description BLOOD RIGHT ARM  Final   Special Requests   Final    BOTTLES DRAWN AEROBIC AND ANAEROBIC Blood Culture adequate volume Performed at [facility] [address] [place], [place] [address]    Culture NO GROWTH 3 DAYS  Final   Report Status PENDING  Incomplete  Culture, blood (routine x 2)     Status: None (Preliminary result)   Collection Time: 08/31/19  5:23 PM   Specimen: BLOOD  Result Value Ref Range Status   Specimen Description BLOOD RIGHT HAND  Final   Special Requests   Final    BOTTLES DRAWN AEROBIC AND ANAEROBIC Blood Culture adequate volume Performed at [facility] [address] [place], [place] [address]    Culture NO GROWTH 3 DAYS  Final   Report Status PENDING  Incomplete     RN Pressure Injury Documentation:     Estimated body mass index is 19.53 kg/m as calculated from the following:   Height as of this encounter: 5\' 6"  (1.676 m).   Weight as of this encounter: 54.9 kg.  Malnutrition Type:  Nutrition Problem: Increased nutrient needs Etiology: chronic illness, cancer and cancer related treatments   Malnutrition Characteristics:  Signs/Symptoms: estimated needs   Nutrition Interventions:  Interventions: MVI, Other (Comment)([place])  Radiology Studies: CT ABDOMEN PELVIS W CONTRAST  Result Date: 09/03/2019 CLINICAL DATA:  Chronic hepatitis B infection. Hepatoma. Right lower quadrant pain EXAM: CT ABDOMEN AND PELVIS WITH CONTRAST TECHNIQUE: Multidetector CT imaging of the abdomen and pelvis was performed using the standard protocol following bolus administration of intravenous contrast. CONTRAST:   12mL OMNIPAQUE IOHEXOL 300 MG/ML  SOLN COMPARISON:  MRI abdomen 07/25/2019 FINDINGS: Lower chest: New atelectasis and airspace disease identified within the lung bases, right greater than left. Trace right pleural fluid. Right CP angle lymph node is enlarged measuring 1.2 cm, image 14/2. Previously this measured 1 cm. Hepatobiliary: Innumerable, progressive lesions are identified throughout the liver. Lesions appear more confluent and increased in size. Confluent mass involving the lateral segment of left lobe of liver measures 4.0 x 3.2 cm, image 34/2. Previously this measured 3.1 x 2.7 cm. Gallbladder unremarkable. No gallstones identified. No biliary ductal dilatation. Pancreas:  Unremarkable. No pancreatic ductal dilatation or surrounding inflammatory changes. Spleen: Normal in size without focal abnormality. Adrenals/Urinary Tract: Normal adrenal glands. No kidney mass or hydronephrosis identified. Urinary bladder unremarkable. Stomach/Bowel: Stomach is nondistended. The small bowel loops are normal. No small bowel wall thickening, inflammation or distension. The appendix is unremarkable. There is a large stool burden identified throughout the colon particularly within the right colon including the cecum. Findings may reflect underlying constipation. No pathologic dilatation of the colon. Vascular/Lymphatic: Large low-density lymph node in the peripancreatic region measures 3.2 x 1.9 cm, image 34/2. Previously 2.5 x 0.8 cm. Aortocaval lymph node posterior to head of pancreas measures 2.0 x 1.0 cm, image 38/2. Previously 0.9 x 0.6 cm. Porta hepatic node measures 1.4 x 1.9 cm, image 31/2. Previously 1.4 x 1.2 cm left retroperitoneal node measures 0.9 cm, image 47/2. New from previous exam. Reproductive: Prostate is unremarkable. Other: There is a small volume of ascites within the abdomen and pelvis. New from previous study. Musculoskeletal: No acute or significant osseous findings. IMPRESSION: 1. Extensive tumor  burden within the liver is progressive when compared with 07/25/2019. 2. New upper abdominal metastatic adenopathy. 3. Large stool burden identified throughout the colon particularly within the right colon including the cecum. Findings may reflect underlying constipation. 4. Trace right pleural fluid and small volume of ascites. New from previous exam 5. New atelectasis and airspace disease identified within the lung bases, right greater than left. Electronically Signed   By: Kerby Moors M.D.   On: 09/03/2019 15:56   Scheduled Meds: . dronabinol  2.5 mg Oral BID AC  . DULoxetine  20 mg Oral Daily  . entecavir  0.5 mg Oral Daily  . feeding supplement ([place] STANDARD 1.4)  325 mL Oral BID BM  . heparin  5,000 Units Subcutaneous Q12H  . lactulose  30 g Oral TID  . metoCLOPramide  5 mg Oral TID AC & HS  . multivitamin with minerals  1 tablet Oral Daily  . oxyCODONE  15 mg Oral Q8H  . polyethylene glycol  17 g Oral BID  . senna-docusate  2 tablet Oral BID   Continuous Infusions:   LOS: 4 days   Domenic Polite, MD

## 2019-09-04 NOTE — Progress Notes (Signed)
Physical Therapy Treatment Patient Details Name: Maurice Lowe MRN: [id number] DOB: 1981/04/08 Today's Date: 09/04/2019    History of Present Illness Maurice Lowe is a 39 y.o. male with medical history significant of hepatocellular carcinoma, hepatitis B, history of non-Hodgkin lymphoma who presents with ongoing abdominal pain in setting malignancy and nausea/ vomiting.  Patient reports he has been dealing with pain for quite a while now and his oncology team has been titrating his pain medications but he still maintains that he has severe epigastric pain at times and on occasion to his left side.  He states that for the past 1 week he has been mostly sedentary because movement exacerbates the pain and he has to be somewhat of an incline position to tolerate.  He reports he is also not had any real food for over a week now and has been vomiting every 4 hours.  He denies any fevers, chills but reports that he has occasional hot flashes.  He does not report any particular trigger to this episode.  It is unclear if he filled his duloxetine and or some of the pain medication changes from his last visit were implemented.  He does report constipation and reports his last bowel movement was 4 days ago but also has not eaten much in over a week.    PT Comments    Patient with slight decline in mobility today. Patient and Rn staff report he had a fall early this AM off BSC/toilet; pt states he slid off the commode. Patient agreeable to participate in therapy session, he required supervision and cues for bed mobility, pt continues to move slow due to abdominal pain. Min assist required to complete power up and steady balance in standing. Patient very unsteady with gait today and scissoring steps with Rt LE. Pt unable to maintain knee extension for stance on Rt and required assist. Gait distance limited by weakness, fatigue, dizziness and HR noted to be elevated to 144 bpm. Seat provided in chair and  pt's HR decreased to 121 bpm and dizziness subsided. Patient will continue to benefit from skilled PT interventions to progress mobility as able. Discharge recommendations updated as pt requires increased assist, he may require SNF placement vs 24/7 care and assist at home with [facility] services.   Follow Up Recommendations  SNF;Supervision/Assistance - 24 hour;Home health PT(HHPT vs SNF pending progress and plan for medical management)     Equipment Recommendations  Rolling walker with 5" wheels    Recommendations for Other Services       Precautions / Restrictions Precautions Precautions: Fall Restrictions Weight Bearing Restrictions: No    Mobility  Bed Mobility Overal bed mobility: Needs Assistance Bed Mobility: Supine to Sit     Supine to sit: Supervision;HOB elevated     General bed mobility comments: pt slow to initiate and increased time required with use of bed rail and HOB elevated.  Transfers Overall transfer level: Needs assistance Equipment used: Rolling walker (2 wheeled) Transfers: Sit to/from Stand Sit to Stand: Min assist         General transfer comment: pt requires light assist to initiate power up, min assist to steady with rising, pt standing with NBOS. pt unsteady in standing.  Ambulation/Gait Ambulation/Gait assistance: Min assist;Mod assist Gait Distance (Feet): 6 Feet Assistive device: Rolling walker (2 wheeled) Gait Pattern/deviations: Step-to pattern;Decreased stride length;Scissoring;Narrow base of support Gait velocity: slow Gait velocity interpretation: <1.31 ft/sec, indicative of household ambulator General Gait Details: pt with very slow steps  and verbal/visual cues required for step pattern. Pt with difficulty stepping Rt foot forward and scissoring Rt foot across midline requiriong multiple steps to reposition. Rt required manual facilitation at Rt knee to maintain extension in stance phase.   Stairs      Wheelchair Mobility     Modified Rankin (Stroke Patients Only)       Balance Overall balance assessment: Needs assistance Sitting-balance support: Feet supported;No upper extremity supported Sitting balance-Leahy Scale: Fair     Standing balance support: Bilateral upper extremity supported Standing balance-Leahy Scale: Poor Standing balance comment: pt heavily reliant on UE support at this time         Cognition Arousal/Alertness: Awake/alert Behavior During Therapy: WFL for tasks assessed/performed Overall Cognitive Status: Within Functional Limits for tasks assessed     Exercises      General Comments        Pertinent Vitals/Pain Pain Assessment: 0-10 Pain Score: 3  Pain Location: abdomen with certain movements and with gait belt Pain Descriptors / Indicators: Discomfort Pain Intervention(s): Limited activity within patient's tolerance;Repositioned;Monitored during session           PT Goals (current goals can now be found in the care plan section) Acute Rehab PT Goals Patient Stated Goal: back home with spouse/family to assist PT Goal Formulation: With patient/family Time For Goal Achievement: 09/10/19 Potential to Achieve Goals: Good    Frequency    Min 3X/week      PT Plan Discharge plan needs to be updated       AM-PAC PT "6 Clicks" Mobility   Outcome Measure  Help needed turning from your back to your side while in a flat bed without using bedrails?: A Little Help needed moving from lying on your back to sitting on the side of a flat bed without using bedrails?: A Little Help needed moving to and from a bed to a chair (including a wheelchair)?: A Little Help needed standing up from a chair using your arms (e.g., wheelchair or bedside chair)?: A Little Help needed to walk in hospital room?: A Lot Help needed climbing 3-5 steps with a railing? : Total 6 Click Score: 15    End of Session Equipment Utilized During Treatment: Gait belt Activity Tolerance: Patient  tolerated treatment well;Patient limited by fatigue;Patient limited by pain Patient left: in chair;with call bell/phone within reach;with chair alarm set;with family/visitor present Nurse Communication: Mobility status PT Visit Diagnosis: Unsteadiness on feet (R26.81);Other abnormalities of gait and mobility (R26.89);Pain Pain - Right/Left: Right Pain - part of body: (abdomen)     Time: [id number] PT Time Calculation (min) (ACUTE ONLY): 27 min  Charges:  $Gait Training: 8-22 mins $Therapeutic Activity: 8-22 mins                     Verner Mould, DPT Physical Therapist with [facility] [phone]  09/04/2019 1:26 PM

## 2019-09-05 DIAGNOSIS — C220 Liver cell carcinoma: Secondary | ICD-10-CM | POA: Diagnosis not present

## 2019-09-05 LAB — COMPREHENSIVE METABOLIC PANEL
ALT: 40 U/L (ref 0–44)
AST: 168 U/L — ABNORMAL HIGH (ref 15–41)
Albumin: 2.6 g/dL — ABNORMAL LOW (ref 3.5–5.0)
Alkaline Phosphatase: 134 U/L — ABNORMAL HIGH (ref 38–126)
Anion gap: 8 (ref 5–15)
BUN: 13 mg/dL (ref 6–20)
CO2: 21 mmol/L — ABNORMAL LOW (ref 22–32)
Calcium: 11.8 mg/dL — ABNORMAL HIGH (ref 8.9–10.3)
Chloride: 102 mmol/L (ref 98–111)
Creatinine, Ser: 0.57 mg/dL — ABNORMAL LOW (ref 0.61–1.24)
GFR calc Af Amer: >60 mL/min (ref >60)
GFR calc non Af Amer: >60 mL/min (ref >60)
Glucose, Bld: 91 mg/dL (ref 70–99)
Potassium: 4.9 mmol/L (ref 3.5–5.1)
Sodium: 131 mmol/L — ABNORMAL LOW (ref 135–145)
Total Bilirubin: 0.8 mg/dL (ref 0.3–1.2)
Total Protein: 9.4 g/dL — ABNORMAL HIGH (ref 6.5–8.1)

## 2019-09-05 LAB — CBC
HCT: 40.2 % (ref 39.0–52.0)
Hemoglobin: 12.5 g/dL — ABNORMAL LOW (ref 13.0–17.0)
MCH: 27.1 pg (ref 26.0–34.0)
MCHC: 31.1 g/dL (ref 30.0–36.0)
MCV: 87.2 fL (ref 80.0–100.0)
Platelets: 299 10*3/uL (ref 150–400)
RBC: 4.61 MIL/uL (ref 4.22–5.81)
RDW: 15 % (ref 11.5–15.5)
WBC: 12.8 10*3/uL — ABNORMAL HIGH (ref 4.0–10.5)
nRBC: 0 % (ref 0.0–0.2)

## 2019-09-05 LAB — CULTURE, BLOOD (ROUTINE X 2)
Culture: NO GROWTH
Culture: NO GROWTH
Special Requests: ADEQUATE
Special Requests: ADEQUATE

## 2019-09-05 MED ORDER — ONDANSETRON HCL 4 MG/2ML IJ SOLN
4.0000 mg | Freq: Four times a day (QID) | INTRAMUSCULAR | Status: DC | PRN
  Administered 2019-09-05 – 2019-09-06 (×5): 4 mg via INTRAVENOUS
  Filled 2019-09-05 (×5): qty 2

## 2019-09-05 MED ORDER — SODIUM CHLORIDE 0.9 % IV SOLN: INTRAVENOUS | Status: DC

## 2019-09-05 NOTE — Progress Notes (Signed)
PROGRESS NOTE    Maurice Lowe  [id number] DOB: 1980-08-09 DOA: 08/30/2019 PCP: Patient, No Pcp Per  Brief Narrative:  Maurice Lowe is a 39 y.o. male with medical history significant of hepatocellular carcinoma, chronic hepatitis B, liver cirrhosis, history of non-Hodgkin lymphoma who presented with ongoing abdominal pain in setting malignancy and nausea/ vomiting.    Assessment & Plan:  Hepatocellular carcinoma Chronic hepatitis B Liver cirrhosis Hepatic encephalopathy Worsening abdominal pain, nausea -Followed by oncology Dr. Burr Medico is currently on palliative immunotherapy on biologic and thus far has tolerated.  Tolerated first cycle of Tecentriq and Avastin moderately well -OxyContin dose increased again to 20 mg every 8, limited by drowsiness -Repeat CT abdomen 4/14 noted extensive tumor burden within the liver is progressive compared to imaging in March and new upper abdominal metastatic adenopathy noted -Continue lactulose at higher dose, mental status is improving -Oncology following and discussed complex situation with patient and family, family remains hopeful of eventual improvement  -Hypercalcemia -Had initially resolved with hydration, now with worsening,  -Given Zometa x1 on 4/15, agree with IV fluids today  Constipation -Secondary to narcotics -Continue Senokot, MiraLAX, lactulose dosing -Now resolved  History of T-cell non-Hodgkin's lymphoma -Status post chemotherapy, no active issues  Normocytic Anemia -Stable  DVT prophylaxis: Subcu heparin Code Status: FULL CODE  Family Communication: Discussed with mother and fianc at bedside Disposition Plan: Patient is from home, plan to discharge home when his nausea, abdominal pain is manageable and mental status is stable unfortunately, clinical decline continues  Consultants:   [place]  Interventional Radiology    Procedures: Port-A-Cath placement to be done  in the morning  Antimicrobials:  Anti-infectives (From admission, onward)   Start     Dose/Rate Route Frequency Ordered Stop   09/01/19 1500  ceFAZolin (ANCEF) IVPB 2g/100 mL premix     2 g 200 mL/hr over 30 Minutes Intravenous To Radiology 08/31/19 1826 09/01/19 1240   08/31/19 1000  entecavir (BARACLUDE) tablet 0.5 mg     0.5 mg Oral Daily 08/30/19 1226       Subjective: -Continues to have abdominal pain, very poor p.o. intake -Patient fell last night  Objective: Vitals:   09/04/19 1355 09/04/19 2135 09/05/19 0600 09/05/19 0605  BP: (!) 130/91 (!) 135/96 (!) 130/96   Pulse: (!) 109 94 (!) 119 (!) 108  Resp: 19 16 14    Temp: (!) 97.5 F (36.4 C) (!) 97.4 F (36.3 C) (!) 97.5 F (36.4 C)   TempSrc: Oral Oral Oral   SpO2: 96% 98% 98%   Weight:      Height:        Intake/Output Summary (Last 24 hours) at 09/05/2019 1241 Last data filed at 09/05/2019 0559 Gross per 24 hour  Intake 240 ml  Output --  Net 240 ml   Filed Weights   08/30/19 0813  Weight: 54.9 kg   Examination: Physical Exam:  Gen: Extremely frail chronically ill-appearing African-American male sitting up in bed, awake alert oriented x2, more lucid today HEENT: Oral mucosa is dry  lungs: Decreased breath sounds the bases, otherwise clear CVS: S1-S2, regular rhythm, tachycardic Abd: Mild right-sided tenderness, nondistended, bowel sounds diminished but present Extremities: Trace edema Skin: no new rashes on exposed skin   Data Reviewed: I have personally reviewed following labs and imaging studies  CBC: Recent Labs  Lab 08/30/19 0848 08/31/19 [id number] 08/31/19 1723 08/31/19 1723 09/01/19 0540 09/02/19 0533 09/03/19 0500 09/04/19 0522 09/05/19 [id number]  WBC 11.9*   < > 16.7*   < > 16.7* 16.3* 12.4* 11.2* 12.8*  NEUTROABS 9.2*  --  13.3*  --  13.5* 12.8* 9.4*  --   --   HGB 13.2   < > 11.6*   < > 11.1* 11.0* 11.0* 11.7* 12.5*  HCT 41.0   < > 36.5*   < > 34.7* 34.6* 34.7* 36.6* 40.2  MCV 85.4   < >  86.5   < > 85.3 84.8 85.3 85.7 87.2  PLT 333   < > 287   < > 267 270 286 315 299   < > = values in this interval not displayed.   Basic Metabolic Panel: Recent Labs  Lab 09/01/19 0540 09/02/19 0533 09/03/19 0500 09/04/19 0522 09/05/19 0829  NA 132* 128* 128* 131* 131*  K 4.2 3.9 4.0 4.7 4.9  CL 101 98 101 101 102  CO2 19* 22 21* 22 21*  GLUCOSE 83 89 99 101* 91  BUN 11 10 8 10 13   CREATININE 0.56* 0.50* 0.42* 0.53* 0.57*  CALCIUM 10.1 10.1 10.1 11.2* 11.8*  MG 2.2 2.0 1.9  --   --   PHOS 1.1* 1.3* 1.3*  --   --    GFR: Estimated Creatinine Clearance: 96.3 mL/min (A) (by C-G formula based on SCr of 0.57 mg/dL (L)). Liver Function Tests: Recent Labs  Lab 09/01/19 0540 09/02/19 0533 09/03/19 0500 09/04/19 0522 09/05/19 0829  AST 111* 102* 119* 128* 168*  ALT 25 24 31  36 40  ALKPHOS 109 100 132* 141* 134*  BILITOT 1.0 0.7 0.7 0.9 0.8  PROT 8.0 7.8 7.7 8.7* 9.4*  ALBUMIN 2.4* 2.2* 2.1* 2.3* 2.6*   No results for input(s): LIPASE, AMYLASE in the last 168 hours. Recent Labs  Lab 09/03/19 1122  AMMONIA 60*   Coagulation Profile: Recent Labs  Lab 09/01/19 0540  INR 1.4*   Cardiac Enzymes: No results for input(s): CKTOTAL, CKMB, CKMBINDEX, TROPONINI in the last 168 hours. BNP (last 3 results) No results for input(s): PROBNP in the last 8760 hours. HbA1C: No results for input(s): HGBA1C in the last 72 hours. CBG: No results for input(s): GLUCAP in the last 168 hours. Lipid Profile: No results for input(s): CHOL, HDL, LDLCALC, TRIG, CHOLHDL, LDLDIRECT in the last 72 hours. Thyroid Function Tests: No results for input(s): TSH, T4TOTAL, FREET4, T3FREE, THYROIDAB in the last 72 hours. Anemia Panel: Recent Labs    09/03/19 0500  VITAMINB12 1,223*  FOLATE 2.2*  FERRITIN 825*  TIBC 163*  IRON 32*  RETICCTPCT 2.3   Sepsis Labs: No results for input(s): PROCALCITON, LATICACIDVEN in the last 168 hours.  Recent Results (from the past 240 hour(s))  SARS  CORONAVIRUS 2 (TAT 6-24 HRS) Nasopharyngeal Nasopharyngeal Swab     Status: None   Collection Time: 08/30/19  5:02 PM   Specimen: Nasopharyngeal Swab  Result Value Ref Range Status   SARS Coronavirus 2 NEGATIVE NEGATIVE Final    Comment: (NOTE) SARS-CoV-2 target nucleic acids are NOT DETECTED. The SARS-CoV-2 RNA is generally detectable in upper and lower respiratory specimens during the acute phase of infection. Negative results do not preclude SARS-CoV-2 infection, do not rule out co-infections with other pathogens, and should not be used as the sole basis for treatment or other patient management decisions. Negative results must be combined with clinical observations, patient history, and epidemiological information. The expected result is Negative. Fact Sheet for Patients: SugarRoll.be Fact Sheet for Healthcare Providers: https://www.woods-mathews.com/ This test is not  yet approved or cleared by the [place] and  has been authorized for detection and/or diagnosis of SARS-CoV-2 by FDA under an Emergency Use Authorization (EUA). This EUA will remain  in effect (meaning this test can be used) for the duration of the COVID-19 declaration under Section 56 4(b)(1) of the Act, 21 U.S.C. section 360bbb-3(b)(1), unless the authorization is terminated or revoked sooner. Performed at [facility], [place] [address], [place], [place] [address]   Culture, Urine     Status: Abnormal   Collection Time: 08/31/19  4:37 PM   Specimen: Urine, Random  Result Value Ref Range Status   Specimen Description   Final    URINE, RANDOM Performed at [facility] [address] [place], [place] [address]    Special Requests   Final    NONE Performed at [facility], [place] [address] [place], [place] [address]    Culture (A)  Final    <10,000 COLONIES/mL INSIGNIFICANT GROWTH Performed at [facility] [address] [place], [place] [address]    Report Status 09/02/2019 FINAL  Final  Culture, blood (routine x 2)     Status: None (Preliminary result)   Collection Time: 08/31/19  5:23 PM   Specimen: BLOOD  Result Value Ref Range Status   Specimen Description   Final    BLOOD RIGHT ARM Performed at [facility] [address], [place], [place] [address]    Special Requests   Final    BOTTLES DRAWN AEROBIC AND ANAEROBIC Blood Culture adequate volume Performed at [facility] [address], [place], [place] [address]    Culture   Final    NO GROWTH 4 DAYS Performed at [facility], [place] [address], [place], [place] [address]    Report Status PENDING  Incomplete  Culture, blood (routine x 2)     Status: None (Preliminary result)   Collection Time: 08/31/19  5:23 PM   Specimen: BLOOD  Result Value Ref Range Status   Specimen Description   Final    BLOOD RIGHT HAND Performed at [place] [address] [place], [place] [address]    Special Requests   Final    BOTTLES DRAWN AEROBIC AND ANAEROBIC Blood Culture adequate volume Performed at [facility] [address], [place], [place] [address]    Culture   Final    NO GROWTH 4 DAYS Performed at [facility], [place] [address] [place], [place] [address]    Report Status PENDING  Incomplete     RN Pressure Injury Documentation:     Estimated body mass index is 19.53 kg/m as calculated from the following:   Height as of this encounter: 5\' 6"  (1.676 m).   Weight as of this encounter: 54.9 kg.  Malnutrition Type:  Nutrition Problem: Increased nutrient needs Etiology: chronic illness, cancer and cancer related treatments   Malnutrition Characteristics:  Signs/Symptoms: estimated needs   Nutrition Interventions:  Interventions: MVI, Other (Comment)([place])  Radiology Studies: CT ABDOMEN PELVIS W CONTRAST  Result Date:  09/03/2019 CLINICAL DATA:  Chronic hepatitis B infection. Hepatoma. Right lower quadrant pain EXAM: CT ABDOMEN AND PELVIS WITH CONTRAST TECHNIQUE: Multidetector CT imaging of the abdomen and pelvis was performed using the standard protocol following bolus administration of intravenous contrast. CONTRAST:  12mL OMNIPAQUE IOHEXOL 300 MG/ML  SOLN COMPARISON:  MRI abdomen 07/25/2019 FINDINGS: Lower chest: New atelectasis and  airspace disease identified within the lung bases, right greater than left. Trace right pleural fluid. Right CP angle lymph node is enlarged measuring 1.2 cm, image 14/2. Previously this measured 1 cm. Hepatobiliary: Innumerable, progressive lesions are identified throughout the liver. Lesions appear more confluent and increased in size. Confluent mass involving the lateral segment of left lobe of liver measures 4.0 x 3.2 cm, image 34/2. Previously this measured 3.1 x 2.7 cm. Gallbladder unremarkable. No gallstones identified. No biliary ductal dilatation. Pancreas: Unremarkable. No pancreatic ductal dilatation or surrounding inflammatory changes. Spleen: Normal in size without focal abnormality. Adrenals/Urinary Tract: Normal adrenal glands. No kidney mass or hydronephrosis identified. Urinary bladder unremarkable. Stomach/Bowel: Stomach is nondistended. The small bowel loops are normal. No small bowel wall thickening, inflammation or distension. The appendix is unremarkable. There is a large stool burden identified throughout the colon particularly within the right colon including the cecum. Findings may reflect underlying constipation. No pathologic dilatation of the colon. Vascular/Lymphatic: Large low-density lymph node in the peripancreatic region measures 3.2 x 1.9 cm, image 34/2. Previously 2.5 x 0.8 cm. Aortocaval lymph node posterior to head of pancreas measures 2.0 x 1.0 cm, image 38/2. Previously 0.9 x 0.6 cm. Porta hepatic node measures 1.4 x 1.9 cm, image 31/2. Previously 1.4 x 1.2 cm  left retroperitoneal node measures 0.9 cm, image 47/2. New from previous exam. Reproductive: Prostate is unremarkable. Other: There is a small volume of ascites within the abdomen and pelvis. New from previous study. Musculoskeletal: No acute or significant osseous findings. IMPRESSION: 1. Extensive tumor burden within the liver is progressive when compared with 07/25/2019. 2. New upper abdominal metastatic adenopathy. 3. Large stool burden identified throughout the colon particularly within the right colon including the cecum. Findings may reflect underlying constipation. 4. Trace right pleural fluid and small volume of ascites. New from previous exam 5. New atelectasis and airspace disease identified within the lung bases, right greater than left. Electronically Signed   By: Kerby Moors M.D.   On: 09/03/2019 15:56   Scheduled Meds: . Chlorhexidine Gluconate Cloth  6 each Topical Daily  . dronabinol  2.5 mg Oral BID AC  . DULoxetine  20 mg Oral Daily  . entecavir  0.5 mg Oral Daily  . feeding supplement ([place] STANDARD 1.4)  325 mL Oral BID BM  . heparin  5,000 Units Subcutaneous Q12H  . lactulose  30 g Oral TID  . metoCLOPramide  5 mg Oral TID AC & HS  . multivitamin with minerals  1 tablet Oral Daily  . oxyCODONE  15 mg Oral Q8H  . polyethylene glycol  17 g Oral BID  . senna-docusate  2 tablet Oral BID   Continuous Infusions: . sodium chloride      LOS: 5 days   Domenic Polite, MD

## 2019-09-05 NOTE — Progress Notes (Signed)
Pts VS were done shortly after he vomited, and HR was 119, triggering yellow MEWS. Gave him a few minutes to recover, and HR rechecked at 108, which is consistent with where it has been. Continue to monitor. Hortencia Conradi RN

## 2019-09-05 NOTE — Progress Notes (Signed)
OT Cancellation Note  Patient Details Name: Maurice Lowe MRN: [id number] DOB: [birth year]   Cancelled Treatment:     Checked with patient in AM, reports recently vomited and feeling very weak and asked to check back later. Upon second attempt patient had just worked with physical therapy. Will re-attempt at later date.  Delbert Phenix OT Pager [phone]   Rosemary Holms 09/05/2019, 2:03 PM

## 2019-09-05 NOTE — Progress Notes (Signed)
Physical Therapy Treatment Patient Details Name: Maurice Lowe MRN: [id number] DOB: 08/03/1980 Today's Date: 09/05/2019    History of Present Illness Maurice Lowe is a 39 y.o. male with medical history significant of hepatocellular carcinoma, hepatitis B, history of non-Hodgkin lymphoma who presents with ongoing abdominal pain in setting malignancy and nausea/ vomiting.  Patient reports he has been dealing with pain for quite a while now and his oncology team has been titrating his pain medications but he still maintains that he has severe epigastric pain at times and on occasion to his left side.  He states that for the past 1 week he has been mostly sedentary because movement exacerbates the pain and he has to be somewhat of an incline position to tolerate.  He reports he is also not had any real food for over a week now and has been vomiting every 4 hours.  He denies any fevers, chills but reports that he has occasional hot flashes.  He does not report any particular trigger to this episode.  It is unclear if he filled his duloxetine and or some of the pain medication changes from his last visit were implemented.  He does report constipation and reports his last bowel movement was 4 days ago but also has not eaten much in over a week.    PT Comments    Patient continues to be limited by increasing weakness and poor tolerance to activity. Pt required in creased assist to sit up EOB and was limited by nausea and abdomen pain. Pt spit up some saliva while sitting EOB. He was unable to attempt transfer today but was agreeable to perform seated exercises, HR resting at 120 at start of exercises. Pt had difficulty following cues for long kicks and HR increased to 134 bpm and BP elevated. Pt returned to supine and repositioned for comfort. Will continue to work with pt as able and progress towards pt's goals.    Follow Up Recommendations  SNF;Supervision/Assistance - 24 hour;Home health PT(HHPT  vs SNF pending progress and plan for medical management)     Equipment Recommendations  Rolling walker with 5" wheels    Recommendations for Other Services       Precautions / Restrictions Precautions Precautions: Fall Restrictions Weight Bearing Restrictions: No    Mobility  Bed Mobility Overal bed mobility: Needs Assistance Bed Mobility: Supine to Sit;Sit to Supine     Supine to sit: HOB elevated;Min assist Sit to supine: Min assist;HOB elevated   General bed mobility comments: pt continues to require increased time to initiate movements and min assist today to scoot fully to EOB. Pt reported nausea/dizziness and pain with sitting up EOB. Pt reported it improved slightly after a couple minutes. Attempted seated exercises at EOB but pt having diffiulty following commands and counting repetitinos. Pt's eyes drifting while sitting up and he reported nausea and tiredness. BP 139/109 after sitting and HR ireached max of 134 bpm. Pt returned to supine. Pt attempted to boost himself up with bed rails and instructions for bridging however unsuccessful. 2+ assist to reposition in bed.  Transfers       Ambulation/Gait      Stairs          Wheelchair Mobility    Modified Rankin (Stroke Patients Only)       Balance Overall balance assessment: Needs assistance Sitting-balance support: Feet supported;No upper extremity supported Sitting balance-Leahy Scale: Fair         Cognition Arousal/Alertness: Awake/alert Behavior During  Therapy: WFL for tasks assessed/performed Overall Cognitive Status: Within Functional Limits for tasks assessed         Exercises      General Comments        Pertinent Vitals/Pain Pain Assessment: 0-10 Pain Score: 7  Pain Location: abdomen with certain movements and with gait belt(also nausea) Pain Descriptors / Indicators: Discomfort(nausea) Pain Intervention(s): Limited activity within patient's tolerance;Monitored during  session;Repositioned           PT Goals (current goals can now be found in the care plan section) Acute Rehab PT Goals Patient Stated Goal: back home with spouse/family to assist PT Goal Formulation: With patient/family Time For Goal Achievement: 09/10/19 Potential to Achieve Goals: Good Progress towards PT goals: Not progressing toward goals - comment;PT to reassess next treatment    Frequency    Min 2X/week      PT Plan Current plan remains appropriate;Frequency needs to be updated       AM-PAC PT "6 Clicks" Mobility   Outcome Measure  Help needed turning from your back to your side while in a flat bed without using bedrails?: A Little Help needed moving from lying on your back to sitting on the side of a flat bed without using bedrails?: A Little Help needed moving to and from a bed to a chair (including a wheelchair)?: A Lot Help needed standing up from a chair using your arms (e.g., wheelchair or bedside chair)?: A Lot Help needed to walk in hospital room?: A Lot Help needed climbing 3-5 steps with a railing? : Total 6 Click Score: 13    End of Session   Activity Tolerance: Patient limited by lethargy;Treatment limited secondary to medical complications (Comment)(nausea) Patient left: with family/visitor present;in bed;with call bell/phone within reach;with bed alarm set Nurse Communication: Mobility status PT Visit Diagnosis: Unsteadiness on feet (R26.81);Other abnormalities of gait and mobility (R26.89);Pain Pain - Right/Left: Right Pain - part of body: (abdomen)     Time: [id number] PT Time Calculation (min) (ACUTE ONLY): 21 min  Charges:  $Therapeutic Activity: 8-22 mins                     Verner Mould, DPT Physical Therapist with [facility] [phone]  09/05/2019 2:24 PM

## 2019-09-05 NOTE — Progress Notes (Addendum)
Maurice Lowe   DOB:1980/08/26   [id number]   [id number]  Oncology follow up   Subjective: Bowels are moving well.  Still having abdominal discomfort and requiring IV pain medication.  Vomited this morning.  He is still having intermittent episodes of confusion but wife thinks these are improved.  Patient does not recall our discussion yesterday about the results of his CT scan.  Objective:  Vitals:   09/05/19 0600 09/05/19 0605  BP: (!) 130/96   Pulse: (!) 119 (!) 108  Resp: 14   Temp: (!) 97.5 F (36.4 C)   SpO2: 98%     Body mass index is 19.53 kg/m.  Intake/Output Summary (Last 24 hours) at 09/05/2019 1048 Last data filed at 09/05/2019 0559 Gross per 24 hour  Intake 240 ml  Output --  Net 240 ml     Sclerae unicteric  Oropharynx clear  No peripheral adenopathy  Lungs clear -- no rales or rhonchi  Heart regular rate and rhythm  Abdomen soft, (+) tenderness in the right upper quadrant  MSK no focal spinal tenderness, no peripheral edema  Neuro nonfocal   CBG (last 3)  No results for input(s): GLUCAP in the last 72 hours.   Labs:  Urine Studies No results for input(s): UHGB, CRYS in the last 72 hours.  Invalid input(s): UACOL, UAPR, USPG, UPH, UTP, UGL, [place], UBIL, UNIT, UROB, [place], UEPI, UWBC, [place], [place], [place], [place], [place]  Basic Metabolic Panel: Recent Labs  Lab 09/01/19 0540 09/01/19 0540 09/02/19 0533 09/02/19 0533 09/03/19 0500 09/03/19 0500 09/04/19 0522 09/05/19 0829  NA 132*  --  128*  --  128*  --  131* 131*  K 4.2   < > 3.9   < > 4.0   < > 4.7 4.9  CL 101  --  98  --  101  --  101 102  CO2 19*  --  22  --  21*  --  22 21*  GLUCOSE 83  --  89  --  99  --  101* 91  BUN 11  --  10  --  8  --  10 13  CREATININE 0.56*  --  0.50*  --  0.42*  --  0.53* 0.57*  CALCIUM 10.1  --  10.1  --  10.1  --  11.2* 11.8*  MG 2.2  --  2.0  --  1.9  --   --   --   PHOS 1.1*  --  1.3*  --  1.3*  --   --   --    < > = values in this interval not  displayed.   GFR Estimated Creatinine Clearance: 96.3 mL/min (A) (by C-G formula based on SCr of 0.57 mg/dL (L)). Liver Function Tests: Recent Labs  Lab 09/01/19 0540 09/02/19 0533 09/03/19 0500 09/04/19 0522 09/05/19 0829  AST 111* 102* 119* 128* 168*  ALT 25 24 31  36 40  ALKPHOS 109 100 132* 141* 134*  BILITOT 1.0 0.7 0.7 0.9 0.8  PROT 8.0 7.8 7.7 8.7* 9.4*  ALBUMIN 2.4* 2.2* 2.1* 2.3* 2.6*   No results for input(s): LIPASE, AMYLASE in the last 168 hours. Recent Labs  Lab 09/03/19 1122  AMMONIA 60*   Coagulation profile Recent Labs  Lab 09/01/19 0540  INR 1.4*    CBC: Recent Labs  Lab 08/30/19 0848 08/31/19 0607 08/31/19 1723 08/31/19 1723 09/01/19 0540 09/02/19 0533 09/03/19 0500 09/04/19 0522 09/05/19 0829  WBC 11.9*   < > 16.7*   < >  16.7* 16.3* 12.4* 11.2* 12.8*  NEUTROABS 9.2*  --  13.3*  --  13.5* 12.8* 9.4*  --   --   HGB 13.2   < > 11.6*   < > 11.1* 11.0* 11.0* 11.7* 12.5*  HCT 41.0   < > 36.5*   < > 34.7* 34.6* 34.7* 36.6* 40.2  MCV 85.4   < > 86.5   < > 85.3 84.8 85.3 85.7 87.2  PLT 333   < > 287   < > 267 270 286 315 299   < > = values in this interval not displayed.   Cardiac Enzymes: No results for input(s): CKTOTAL, CKMB, CKMBINDEX, TROPONINI in the last 168 hours. BNP: Invalid input(s): POCBNP CBG: No results for input(s): GLUCAP in the last 168 hours. D-Dimer No results for input(s): DDIMER in the last 72 hours. Hgb A1c No results for input(s): HGBA1C in the last 72 hours. Lipid Profile No results for input(s): CHOL, HDL, LDLCALC, TRIG, CHOLHDL, LDLDIRECT in the last 72 hours. Thyroid function studies No results for input(s): TSH, T4TOTAL, T3FREE, THYROIDAB in the last 72 hours.  Invalid input(s): FREET3 Anemia work up Recent Labs    09/03/19 0500  VITAMINB12 1,223*  FOLATE 2.2*  FERRITIN 825*  TIBC 163*  IRON 32*  RETICCTPCT 2.3   Microbiology Recent Results (from the past 240 hour(s))  SARS CORONAVIRUS 2 (TAT 6-24  HRS) Nasopharyngeal Nasopharyngeal Swab     Status: None   Collection Time: 08/30/19  5:02 PM   Specimen: Nasopharyngeal Swab  Result Value Ref Range Status   SARS Coronavirus 2 NEGATIVE NEGATIVE Final    Comment: (NOTE) SARS-CoV-2 target nucleic acids are NOT DETECTED. The SARS-CoV-2 RNA is generally detectable in upper and lower respiratory specimens during the acute phase of infection. Negative results do not preclude SARS-CoV-2 infection, do not rule out co-infections with other pathogens, and should not be used as the sole basis for treatment or other patient management decisions. Negative results must be combined with clinical observations, patient history, and epidemiological information. The expected result is Negative. Fact Sheet for Patients: SugarRoll.be Fact Sheet for Healthcare Providers: https://www.woods-mathews.com/ This test is not yet approved or cleared by the [place] FDA and  has been authorized for detection and/or diagnosis of SARS-CoV-2 by FDA under an Emergency Use Authorization (EUA). This EUA will remain  in effect (meaning this test can be used) for the duration of the COVID-19 declaration under Section 56 4(b)(1) of the Act, 21 U.S.C. section 360bbb-3(b)(1), unless the authorization is terminated or revoked sooner. Performed at [facility], [place] [address], [place], [place] [address]   Culture, Urine     Status: Abnormal   Collection Time: 08/31/19  4:37 PM   Specimen: Urine, Random  Result Value Ref Range Status   Specimen Description   Final    URINE, RANDOM Performed at [facility] [address], [place], [place] [address]    Special Requests   Final    NONE Performed at [facility], [place] [address], [place], [place] [address]    Culture (A)  Final    <10,000 COLONIES/mL INSIGNIFICANT GROWTH Performed at [facility] [address]  [place],  [address]    Report Status 09/02/2019 FINAL  Final  Culture, blood (routine x 2)     Status: None (Preliminary result)   Collection Time: 08/31/19  5:23 PM   Specimen: BLOOD  Result Value Ref Range Status  Specimen Description   Final    BLOOD RIGHT ARM Performed at [facility] [address], [place], [place] [address]    Special Requests   Final    BOTTLES DRAWN AEROBIC AND ANAEROBIC Blood Culture adequate volume Performed at [facility] [address] [place], [place] [address]    Culture   Final    NO GROWTH 4 DAYS Performed at [facility], [place] [address], [place], [place] [address]    Report Status PENDING  Incomplete  Culture, blood (routine x 2)     Status: None (Preliminary result)   Collection Time: 08/31/19  5:23 PM   Specimen: BLOOD  Result Value Ref Range Status   Specimen Description   Final    BLOOD RIGHT HAND Performed at [facility] [address], [place], [place] [address]    Special Requests   Final    BOTTLES DRAWN AEROBIC AND ANAEROBIC Blood Culture adequate volume Performed at [facility] [address] [place], [place] [address]    Culture   Final    NO GROWTH 4 DAYS Performed at [facility], [place] [address], [place], [place] [address]    Report Status PENDING  Incomplete      Studies:  CT ABDOMEN PELVIS W CONTRAST  Result Date: 09/03/2019 CLINICAL DATA:  Chronic hepatitis B infection. Hepatoma. Right lower quadrant pain EXAM: CT ABDOMEN AND PELVIS WITH CONTRAST TECHNIQUE: Multidetector CT imaging of the abdomen and pelvis was performed using the standard protocol following bolus administration of intravenous contrast. CONTRAST:  118mL OMNIPAQUE IOHEXOL 300 MG/ML  SOLN COMPARISON:  MRI abdomen 07/25/2019 FINDINGS: Lower chest: New atelectasis and airspace disease identified within the lung bases, right greater than left. Trace right pleural  fluid. Right CP angle lymph node is enlarged measuring 1.2 cm, image 14/2. Previously this measured 1 cm. Hepatobiliary: Innumerable, progressive lesions are identified throughout the liver. Lesions appear more confluent and increased in size. Confluent mass involving the lateral segment of left lobe of liver measures 4.0 x 3.2 cm, image 34/2. Previously this measured 3.1 x 2.7 cm. Gallbladder unremarkable. No gallstones identified. No biliary ductal dilatation. Pancreas: Unremarkable. No pancreatic ductal dilatation or surrounding inflammatory changes. Spleen: Normal in size without focal abnormality. Adrenals/Urinary Tract: Normal adrenal glands. No kidney mass or hydronephrosis identified. Urinary bladder unremarkable. Stomach/Bowel: Stomach is nondistended. The small bowel loops are normal. No small bowel wall thickening, inflammation or distension. The appendix is unremarkable. There is a large stool burden identified throughout the colon particularly within the right colon including the cecum. Findings may reflect underlying constipation. No pathologic dilatation of the colon. Vascular/Lymphatic: Large low-density lymph node in the peripancreatic region measures 3.2 x 1.9 cm, image 34/2. Previously 2.5 x 0.8 cm. Aortocaval lymph node posterior to head of pancreas measures 2.0 x 1.0 cm, image 38/2. Previously 0.9 x 0.6 cm. Porta hepatic node measures 1.4 x 1.9 cm, image 31/2. Previously 1.4 x 1.2 cm left retroperitoneal node measures 0.9 cm, image 47/2. New from previous exam. Reproductive: Prostate is unremarkable. Other: There is a small volume of ascites within the abdomen and pelvis. New from previous study. Musculoskeletal: No acute or significant osseous findings. IMPRESSION: 1. Extensive tumor burden within the liver is progressive when compared with 07/25/2019. 2. New upper abdominal metastatic adenopathy. 3. Large stool burden identified throughout the colon particularly within the right colon  including the cecum. Findings may reflect underlying constipation. 4. Trace  right pleural fluid and small volume of ascites. New from previous exam 5. New atelectasis and airspace disease identified within the lung bases, right greater than left. Electronically Signed   By: Kerby Moors M.D.   On: 09/03/2019 15:56    Assessment: 39 y.o.  1.  Metastatic hepatocellular carcinoma 2.  Abdominal pain secondary to [facility] and constipation 3.  Constipation 4.  Hypercalcemia of malignancy 5.  Leukocytosis 6.  Mild anemia 7.  Anorexia and weight loss 8.  Cirrhosis 7.  chronic Hepatitis B infection  8. Hyponatremia 9.  Metabolic encephalopathy  Plan:  -The CT scan results were again discussed today with the patient and his wife.  The patient drifted off to sleep while I was talking with him about it.  I am not certain that he understood the conversation.  Discussed with the wife my concern that he may be due to deconditioned at this point to proceed with chemotherapy.  Wife asked questions about other treatment options including a liver transplant which I told her was not a treatment option for him due to the extent of his disease.  She also asked about staging and prognosis which we discussed in detail. I again discussed CODE STATUS and advanced directives with her.  The patient remains undecided at this point. -The patient's wife expressed concerns about managing his pain when he is discharged.  He is requiring IV pain medication still.  I have requested a palliative care consult for assistance with pain medication management. -His hypercalcemia has worsened today despite receiving Zometa on 09/04/2019.  We will start him on normal saline at 75 cc/h. -Continue lactulose for constipation and hepatic encephalopathy. -We will continue to follow with you.  Mikey Bussing, NP 09/05/2019  10:48 AM  Addendum  I have seen the patient, examined him. I agree with the assessment and and plan and have edited  the notes.   Pt overall very weak, lethargic, has not been out of bed today.  Per wife, his confusion slightly improved after a few bowel movement yesterday with lactulose.  He has not eating much, pain is controlled.  His calcium has been trending up, received 1 dose of Zometa yesterday.   I again had a long conversation with patient's wife and his mother at bedside, I am very concerned that his condition continues to deteriorate, which is mostly related to his cancer progression.  I do not see any signs of infection, or other reversible etiology.  I think his life expectancy is likely less than 2 weeks, and he would not be a candidate for chemotherapy if no improvement. His wife is very overwhelmed.  Patient is eligible for residential hospice at this point, I discussed that with his wife, she is open to it and will discuss with the rest of his family. Palliative consult pending. I spoke with Dr. Broadus John also.   Truitt Merle  09/05/2019

## 2019-09-06 DIAGNOSIS — R1084 Generalized abdominal pain: Secondary | ICD-10-CM | POA: Diagnosis not present

## 2019-09-06 DIAGNOSIS — Z515 Encounter for palliative care: Secondary | ICD-10-CM | POA: Diagnosis not present

## 2019-09-06 DIAGNOSIS — G8929 Other chronic pain: Secondary | ICD-10-CM

## 2019-09-06 DIAGNOSIS — R1013 Epigastric pain: Secondary | ICD-10-CM | POA: Diagnosis not present

## 2019-09-06 DIAGNOSIS — Z7189 Other specified counseling: Secondary | ICD-10-CM | POA: Diagnosis not present

## 2019-09-06 DIAGNOSIS — R531 Weakness: Secondary | ICD-10-CM | POA: Diagnosis not present

## 2019-09-06 LAB — COMPREHENSIVE METABOLIC PANEL
ALT: 43 U/L (ref 0–44)
AST: 212 U/L — ABNORMAL HIGH (ref 15–41)
Albumin: 2.4 g/dL — ABNORMAL LOW (ref 3.5–5.0)
Alkaline Phosphatase: 145 U/L — ABNORMAL HIGH (ref 38–126)
Anion gap: 5 (ref 5–15)
BUN: 13 mg/dL (ref 6–20)
CO2: 21 mmol/L — ABNORMAL LOW (ref 22–32)
Calcium: 11 mg/dL — ABNORMAL HIGH (ref 8.9–10.3)
Chloride: 102 mmol/L (ref 98–111)
Creatinine, Ser: 0.52 mg/dL — ABNORMAL LOW (ref 0.61–1.24)
GFR calc Af Amer: >60 mL/min (ref >60)
GFR calc non Af Amer: >60 mL/min (ref >60)
Glucose, Bld: 92 mg/dL (ref 70–99)
Potassium: 4.7 mmol/L (ref 3.5–5.1)
Sodium: 128 mmol/L — ABNORMAL LOW (ref 135–145)
Total Bilirubin: 1.1 mg/dL (ref 0.3–1.2)
Total Protein: 8.7 g/dL — ABNORMAL HIGH (ref 6.5–8.1)

## 2019-09-06 LAB — AMMONIA: Ammonia: 57 umol/L — ABNORMAL HIGH (ref 9–35)

## 2019-09-06 MED ORDER — HYDROMORPHONE HCL 1 MG/ML IJ SOLN
1.0000 mg | INTRAMUSCULAR | Status: DC | PRN
  Administered 2019-09-06 – 2019-09-08 (×7): 2 mg via INTRAVENOUS
  Filled 2019-09-06 (×7): qty 2

## 2019-09-06 MED ORDER — HYDROMORPHONE HCL 1 MG/ML IJ SOLN
0.5000 mg | INTRAMUSCULAR | Status: DC
  Administered 2019-09-06 – 2019-09-08 (×10): 0.5 mg via INTRAVENOUS
  Filled 2019-09-06 (×11): qty 0.5

## 2019-09-06 NOTE — Consult Note (Signed)
Consultation Note Date: 09/06/2019   Patient Name: Maurice Lowe  DOB: [birth year]  MRN: [id number]  Age / Sex: 39 y.o., male  PCP: Patient, No Pcp Per Referring Physician: Domenic Polite, MD  Reason for Consultation: Disposition and Establishing goals of care  HPI/Patient Profile: 39 y.o. male    admitted on 08/30/2019     Clinical Assessment and Goals of Care: 39 year old gentleman with life limiting illness of chronic hepatocellular carcinoma, liver cirrhosis, chronic hepatitis B with symptom burden of worsening abdominal pain and nausea.  Patient is being followed by Dr. Burr Medico from medical oncology.  He was on palliative immunotherapy.  Repeat imaging has shown extensive tumor burden within the liver progressive compared to imaging in March, new upper abdominal metastatic adenopathy also noted.  Patient requiring lactulose for his mental status.  Hospital course also complicated by hypercalcemia, was given hydration was also given Zometa.  Also on a bowel regimen.  Also has history of T-cell non-Hodgkin's lymphoma status post chemotherapy.  From a medical oncology perspective, it is recommended for the patient to consider comfort care/hospice.  He is noted to have cancer progression with markedly limited prognosis.  A palliative consult has been requested for additional discussions, aggressive symptom management, disposition options discussions and addressing goals of care.  Maurice Lowe is resting in bed.  He appears weak and cachectic.  He complains of upper abdominal discomfort.  He has not rested well.  His wife is present at the bedside extended family also present at the bedside.  I introduced myself and palliative care as follows:  Palliative medicine is specialized medical care for people living with serious illness. It focuses on providing relief from the symptoms and stress of a serious illness.  The goal is to improve quality of life for both the patient and the family.  Goals of care: Broad aims of medical therapy in relation to the patient's values and preferences. Our aim is to provide medical care aimed at enabling patients to achieve the goals that matter most to them, given the circumstances of their particular medical situation and their constraints.   Goals wishes and values important to the patient and the family as a unit attempted to be explored.  Discussed about the serious incurable nature of his progressive malignancy.  Discussed about symptom management, introduced philosophy of hospice.  Patient's wife becomes extremely tearful and leaves the room.  Discussed with patient about pain management.  Discussed with patient about the type of care that can be given in a residential hospice environment.  Offered supportive care when the wife returned back into the room.  See below.  Thank you for the consult.  NEXT OF KIN  wife and mother.   SUMMARY OF RECOMMENDATIONS   Full Code.  Add scheduled IV Dilaudid for aggressive symptom management of pain. Continue with other current pain regimen, patient on IV Dilaudid PRN, also on Adjuvants as well as on PO OxyIR PRN. Also on bowel and anti emetic regimen.  Ongoing discussions with patient, wife  and his family about DNR DNI and residential hospice. Discussed frankly but compassionately about DNR DNI, in my opinion, patient will not benefit from a resuscitative attempt, it may cause more harm than benefit. Also recommend consideration for full scope of comfort measures and transfer to residential hospice. Patient's wife becomes very tearful and distraught by these discussions and leaves the room, patient interacts some but doesn't fully discuss. Extended family present in the room state that their wishes for the patient is to avoid suffering. We will work with patient and family as a unit for ongoing goals of care and disposition discussions.  For now, will initiate aggressive pain management, improve symptoms and monitor.  Thank you for the consult.   Code Status/Advance Care Planning:  Full code    Symptom Management:      Palliative Prophylaxis:   Bowel Regimen  Additional Recommendations (Limitations, Scope, Preferences):  Full Scope Treatment  Psycho-social/Spiritual:   Desire for further Chaplaincy support:yes  Additional Recommendations: Education on Hospice  Prognosis:   < 2 weeks  Discharge Planning: To Be Determined      Primary Diagnoses: Present on Admission: . Hypercalcemia   I have reviewed the medical record, interviewed the patient and family, and examined the patient. The following aspects are pertinent.  Past Medical History:  Diagnosis Date  . Elevated liver function tests   . Hepatic cyst   . Hepatitis   . Hepatitis B   . Non Hodgkin's lymphoma [facility])    age [age]   Social History   Socioeconomic History  . Marital status: Married    Spouse name: Not on file  . Number of children: 3  . Years of education: Not on file  . Highest education level: Not on file  Occupational History  . Occupation: help desk IT  Tobacco Use  . Smoking status: Former Research scientist (life sciences)  . Smokeless tobacco: Never Used  Substance and Sexual Activity  . Alcohol use: Not Currently    Comment: none since admitted to hospital 12/09/15 when starting Hep B treatment, moderate before quitting   . Drug use: Not Currently    Types: Marijuana    Comment: smoked marijuana daily from 21 to age 3, none currently   . Sexual activity: Not on file  Other Topics Concern  . Not on file  Social History Narrative  . Not on file   Social Determinants of Health   Financial Resource Strain:   . Difficulty of Paying Living Expenses:   Food Insecurity:   . Worried About Charity fundraiser in the Last Year:   . Arboriculturist in the Last Year:   Transportation Needs:   . Film/video editor (Medical):   [name] Lack  of Transportation (Non-Medical):   Physical Activity:   . Days of Exercise per Week:   . Minutes of Exercise per Session:   Stress:   . Feeling of Stress :   Social Connections:   . Frequency of Communication with Friends and Family:   . Frequency of Social Gatherings with Friends and Family:   . Attends Religious Services:   . Active Member of Clubs or Organizations:   . Attends Archivist Meetings:   [name] Marital Status:    Family History  Problem Relation Age of Onset  . Heart disease Mother   . Stroke Paternal Grandmother   . Hepatitis Neg Hx   . Liver disease Neg Hx   . Colon cancer Neg Hx   .  Esophageal cancer Neg Hx   . Stomach cancer Neg Hx   . Pancreatic cancer Neg Hx   . Colon polyps Neg Hx   . Rectal cancer Neg Hx    Scheduled Meds: . Chlorhexidine Gluconate Cloth  6 each Topical Daily  . dronabinol  2.5 mg Oral BID AC  . DULoxetine  20 mg Oral Daily  . entecavir  0.5 mg Oral Daily  . feeding supplement (KATE FARMS STANDARD 1.4)  325 mL Oral BID BM  . heparin  5,000 Units Subcutaneous Q12H  .  HYDROmorphone (DILAUDID) injection  0.5 mg Intravenous Q4H  . lactulose  30 g Oral TID  . metoCLOPramide  5 mg Oral TID AC & HS  . multivitamin with minerals  1 tablet Oral Daily  . oxyCODONE  15 mg Oral Q8H  . polyethylene glycol  17 g Oral BID  . senna-docusate  2 tablet Oral BID   Continuous Infusions: . sodium chloride 75 mL/hr at 09/06/19 0847   PRN Meds:.acetaminophen **OR** acetaminophen, alum & mag hydroxide-simeth, bisacodyl, HYDROmorphone (DILAUDID) injection, lidocaine-prilocaine, LORazepam, ondansetron (ZOFRAN) IV, ondansetron, oxyCODONE, prochlorperazine Medications Prior to Admission:  Prior to Admission medications   Medication Sig Start Date End Date Taking? Authorizing Provider  dronabinol (MARINOL) 2.5 MG capsule Take 1 capsule (2.5 mg total) by mouth 2 (two) times daily before a meal. 08/25/19  Yes Truitt Merle, MD  entecavir (BARACLUDE) 0.5 MG  tablet TAKE 1 TABLET BY MOUTH DAILY Patient taking differently: Take 0.5 mg by mouth daily.  07/25/19  Yes Danis, Kirke Corin, MD  Lactulose 20 GM/30ML SOLN Take 30 mLs (20 g total) by mouth every 4 (four) hours as needed (for severe constipation). 08/15/19  Yes Truitt Merle, MD  lidocaine-prilocaine (EMLA) cream Apply 1 application topically as needed. Patient taking differently: Apply 1 application topically as needed (local anesthesia).  08/25/19  Yes Truitt Merle, MD  ondansetron (ZOFRAN) 8 MG tablet Take 1 tablet (8 mg total) by mouth every 8 (eight) hours as needed for nausea or vomiting. 08/20/19  Yes Tanner, Lyndon Code., PA-C  oxyCODONE (OXY IR/ROXICODONE) 5 MG immediate release tablet Take 1 tablet (5 mg total) by mouth every 6 (six) hours as needed for severe pain. Patient taking differently: Take 5-10 mg by mouth every 6 (six) hours as needed for severe pain.  08/26/19  Yes Alla Feeling, NP  oxyCODONE (OXYCONTIN) 10 mg 12 hr tablet Take 1 tablet (10 mg total) by mouth every 8 (eight) hours. 08/21/19  Yes Alla Feeling, NP  DULoxetine (CYMBALTA) 20 MG capsule Take 1 capsule (20 mg total) by mouth daily. 08/21/19   Alla Feeling, NP  levofloxacin (LEVAQUIN) 750 MG tablet Take 1 tablet (750 mg total) by mouth daily. Patient not taking: Reported on 08/30/2019 08/15/19   Truitt Merle, MD  metoCLOPramide (REGLAN) 5 MG tablet Take 1 tablet (5 mg total) by mouth 4 (four) times daily -  before meals and at bedtime. Patient not taking: Reported on 08/30/2019 08/20/19   Harle Stanford., PA-C  prochlorperazine (COMPAZINE) 5 MG tablet Take 1 tablet (5 mg total) by mouth every 8 (eight) hours as needed for nausea or vomiting. 08/25/19   Truitt Merle, MD  omeprazole (PRILOSEC) 20 MG capsule Take 2 x a day for 7 days then once a day until gone 06/19/19 07/04/19  Raylene Everts, MD   Allergies  Allergen Reactions  . Phenergan [Promethazine] Other (See Comments)    Hyperactivity and agitation  Review of Systems Abdominal  pain.   Physical Exam Abdomen is soft not distended.  There is tenderness in epigastric and right upper quadrant area Patient is thin, has cancer cachexia and generalized weakness Temporal wasting Regular work of breathing S1-S2 Awake alert nonfocal Vital Signs: BP (!) 129/94 (BP Location: Left Arm)   Pulse (!) 110   Temp 98.9 F (37.2 C) (Oral)   Resp 14   Ht 5\' 6"  (1.676 m)   Wt 54.9 kg   SpO2 99%   BMI 19.53 kg/m  Pain Scale: 0-10 POSS *See Group Information*: S-Acceptable,Sleep, easy to arouse Pain Score: Asleep   SpO2: SpO2: 99 % O2 Device:SpO2: 99 % O2 Flow Rate: .O2 Flow Rate (L/min): 2 L/min  IO: Intake/output summary:   Intake/Output Summary (Last 24 hours) at 09/06/2019 1110 Last data filed at 09/06/2019 [id number] Gross per 24 hour  Intake 400 ml  Output 600 ml  Net -200 ml    LBM: Last BM Date: 09/04/19 Baseline Weight: Weight: 54.9 kg Most recent weight: Weight: 54.9 kg     Palliative Assessment/Data:   PPS 40%  Time In:  12 Time Out:  1300 Time Total:  60  Greater than 50%  of this time was spent counseling and coordinating care related to the above assessment and plan.  Signed by: Loistine Chance, MD   Please contact Palliative Medicine Team phone at [phone] for questions and concerns.  For individual provider: See Shea Evans

## 2019-09-06 NOTE — Progress Notes (Signed)
PROGRESS NOTE    Ld Eplin  [id number] DOB: 06-Apr-1981 DOA: 08/30/2019 PCP: Patient, No Pcp Per  Brief Narrative:  Maurice Lowe is a 39 y.o. male with medical history significant of hepatocellular carcinoma, chronic hepatitis B, liver cirrhosis, history of non-Hodgkin lymphoma who presented with ongoing abdominal pain in setting malignancy and nausea/ vomiting.    Assessment & Plan:  Hepatocellular carcinoma Chronic hepatitis B Liver cirrhosis Hepatic encephalopathy Worsening abdominal pain, nausea -Followed by oncology Dr. Burr Medico was on palliative immunotherapy, tolerated first cycle of Tecentriq and Avastin  -Repeat CT abdomen 4/14 noted extensive tumor burden within the liver is progressive compared to imaging in March and new upper abdominal metastatic adenopathy noted -OxyContin dose increased again to 20 mg every 8, limited by drowsiness -Continue lactulose at higher dose, mental status fluctuates, recheck ammonia  -Oncology following and prognosis is felt to be weeks,  recommended Hospice -Discussed poor prognosis with patient's wife and mother again today, recommended symptom and comfort focused care to alleviate suffering and DNR, she will think about this further  -Hypercalcemia -Had initially resolved with hydration, now with worsening,  -Given Zometa x1 on 4/15, and IVF -Slight improvement noted in calcium  Constipation -Secondary to narcotics -Continue Senokot, MiraLAX, lactulose dosing -Now resolved  History of T-cell non-Hodgkin's lymphoma -Status post chemotherapy, no active issues  Normocytic Anemia -Stable  DVT prophylaxis: Subcu heparin Code Status: FULL CODE , recommended DNR again Family Communication: Discussed with mother and wife at bedside Disposition Plan: Patient is from home, plan to discharge home when his nausea, abdominal pain is manageable and mental status is stable unfortunately, clinical decline continues  Consultants:     Medical Oncology  Palliative Care Medicine  Interventional Radiology    Procedures: Port-A-Cath placement to be done in the morning  Antimicrobials:  Anti-infectives (From admission, onward)   Start     Dose/Rate Route Frequency Ordered Stop   09/01/19 1500  ceFAZolin (ANCEF) IVPB 2g/100 mL premix     2 g 200 mL/hr over 30 Minutes Intravenous To Radiology 08/31/19 1826 09/01/19 1240   08/31/19 1000  entecavir (BARACLUDE) tablet 0.5 mg     0.5 mg Oral Daily 08/30/19 1226       Subjective: -Continues to have abdominal pain, oral intake is poor  Objective: Vitals:   09/05/19 0605 09/05/19 2215 09/06/19 0624 09/06/19 1326  BP:  (!) 119/91 (!) 129/94 116/90  Pulse: (!) 108 (!) 107 (!) 110 (!) 116  Resp:  14 14 16   Temp:  98.7 F (37.1 C) 98.9 F (37.2 C) 98 F (36.7 C)  TempSrc:  Oral Oral Oral  SpO2:  99% 99% 98%  Weight:      Height:        Intake/Output Summary (Last 24 hours) at 09/06/2019 1334 Last data filed at 09/06/2019 0847 Gross per 24 hour  Intake 400 ml  Output 900 ml  Net -500 ml   Filed Weights   08/30/19 0813  Weight: 54.9 kg   Examination: Physical Exam: Gen: Extremely frail chronically ill-appearing African-American male, sitting up in bed, awake, less alert and interactive today HEENT: dry oral mucosa Lungs: CTAB CVS: RRR,No Gallops,Rubs or new Murmurs Abd: Soft, mild upper abdomen and right-sided tenderness, bowel sounds diminished  extremities: Trace edema Skin: no new rashes on exposed skin   Data Reviewed: I have personally reviewed following labs and imaging studies  CBC: Recent Labs  Lab 08/31/19 1723 08/31/19 1723 09/01/19 0540 09/02/19 0533 09/03/19 0500 09/04/19  0522 09/05/19 0829  WBC 16.7*   < > 16.7* 16.3* 12.4* 11.2* 12.8*  NEUTROABS 13.3*  --  13.5* 12.8* 9.4*  --   --   HGB 11.6*   < > 11.1* 11.0* 11.0* 11.7* 12.5*  HCT 36.5*   < > 34.7* 34.6* 34.7* 36.6* 40.2  MCV 86.5   < > 85.3 84.8 85.3 85.7 87.2  PLT 287    < > 267 270 286 315 299   < > = values in this interval not displayed.   Basic Metabolic Panel: Recent Labs  Lab 09/01/19 0540 09/01/19 0540 09/02/19 0533 09/03/19 0500 09/04/19 0522 09/05/19 0829 09/06/19 0835  NA 132*   < > 128* 128* 131* 131* 128*  K 4.2   < > 3.9 4.0 4.7 4.9 4.7  CL 101   < > 98 101 101 102 102  CO2 19*   < > 22 21* 22 21* 21*  GLUCOSE 83   < > 89 99 101* 91 92  BUN 11   < > 10 8 10 13 13   CREATININE 0.56*   < > 0.50* 0.42* 0.53* 0.57* 0.52*  CALCIUM 10.1   < > 10.1 10.1 11.2* 11.8* 11.0*  MG 2.2  --  2.0 1.9  --   --   --   PHOS 1.1*  --  1.3* 1.3*  --   --   --    < > = values in this interval not displayed.   GFR: Estimated Creatinine Clearance: 96.3 mL/min (A) (by C-G formula based on SCr of 0.52 mg/dL (L)). Liver Function Tests: Recent Labs  Lab 09/02/19 0533 09/03/19 0500 09/04/19 0522 09/05/19 0829 09/06/19 0835  AST 102* 119* 128* 168* 212*  ALT 24 31 36 40 43  ALKPHOS 100 132* 141* 134* 145*  BILITOT 0.7 0.7 0.9 0.8 1.1  PROT 7.8 7.7 8.7* 9.4* 8.7*  ALBUMIN 2.2* 2.1* 2.3* 2.6* 2.4*   No results for input(s): LIPASE, AMYLASE in the last 168 hours. Recent Labs  Lab 09/03/19 1122 09/06/19 1257  AMMONIA 60* 57*   Coagulation Profile: Recent Labs  Lab 09/01/19 0540  INR 1.4*   Cardiac Enzymes: No results for input(s): CKTOTAL, CKMB, CKMBINDEX, TROPONINI in the last 168 hours. BNP (last 3 results) No results for input(s): PROBNP in the last 8760 hours. HbA1C: No results for input(s): HGBA1C in the last 72 hours. CBG: No results for input(s): GLUCAP in the last 168 hours. Lipid Profile: No results for input(s): CHOL, HDL, LDLCALC, TRIG, CHOLHDL, LDLDIRECT in the last 72 hours. Thyroid Function Tests: No results for input(s): TSH, T4TOTAL, FREET4, T3FREE, THYROIDAB in the last 72 hours. Anemia Panel: No results for input(s): VITAMINB12, FOLATE, FERRITIN, TIBC, IRON, RETICCTPCT in the last 72 hours. Sepsis Labs: No results for  input(s): PROCALCITON, LATICACIDVEN in the last 168 hours.  Recent Results (from the past 240 hour(s))  SARS CORONAVIRUS 2 (TAT 6-24 HRS) Nasopharyngeal Nasopharyngeal Swab     Status: None   Collection Time: 08/30/19  5:02 PM   Specimen: Nasopharyngeal Swab  Result Value Ref Range Status   SARS Coronavirus 2 NEGATIVE NEGATIVE Final    Comment: (NOTE) SARS-CoV-2 target nucleic acids are NOT DETECTED. The SARS-CoV-2 RNA is generally detectable in upper and lower respiratory specimens during the acute phase of infection. Negative results do not preclude SARS-CoV-2 infection, do not rule out co-infections with other pathogens, and should not be used as the sole basis for treatment or other patient management decisions.  Negative results must be combined with clinical observations, patient history, and epidemiological information. The expected result is Negative. Fact Sheet for Patients: SugarRoll.be Fact Sheet for Healthcare Providers: https://www.woods-mathews.com/ This test is not yet approved or cleared by the [place] FDA and  has been authorized for detection and/or diagnosis of SARS-CoV-2 by FDA under an Emergency Use Authorization (EUA). This EUA will remain  in effect (meaning this test can be used) for the duration of the COVID-19 declaration under Section 56 4(b)(1) of the Act, 21 U.S.C. section 360bbb-3(b)(1), unless the authorization is terminated or revoked sooner. Performed at [facility], [place] [address] [place], [place] [address]   Culture, Urine     Status: Abnormal   Collection Time: 08/31/19  4:37 PM   Specimen: Urine, Random  Result Value Ref Range Status   Specimen Description   Final    URINE, RANDOM Performed at [facility] [address], [place], [place] [address]    Special Requests   Final    NONE Performed at [facility], [place] [address], [place], [place]  [address]    Culture (A)  Final    <10,000 COLONIES/mL INSIGNIFICANT GROWTH Performed at [facility] [address], [place], [place] [address]    Report Status 09/02/2019 FINAL  Final  Culture, blood (routine x 2)     Status: None   Collection Time: 08/31/19  5:23 PM   Specimen: BLOOD  Result Value Ref Range Status   Specimen Description   Final    BLOOD RIGHT ARM Performed at [facility] [address] [place], [place] [address]    Special Requests   Final    BOTTLES DRAWN AEROBIC AND ANAEROBIC Blood Culture adequate volume Performed at [facility] [address], [place], [place] [address]    Culture   Final    NO GROWTH 5 DAYS Performed at [facility], [place] [address] [place], [place] [address]    Report Status 09/05/2019 FINAL  Final  Culture, blood (routine x 2)     Status: None   Collection Time: 08/31/19  5:23 PM   Specimen: BLOOD  Result Value Ref Range Status   Specimen Description   Final    BLOOD RIGHT HAND Performed at [facility] [address] [place], [place] [address]    Special Requests   Final    BOTTLES DRAWN AEROBIC AND ANAEROBIC Blood Culture adequate volume Performed at [facility] [address], [place],  [address]    Culture   Final    NO GROWTH 5 DAYS Performed at [facility], [place] [address] [place],  [address]    Report Status 09/05/2019 FINAL  Final     RN Pressure Injury Documentation:     Estimated body mass index is 19.53 kg/m as calculated from the following:   Height as of this encounter: 5\' 6"  (1.676 m).   Weight as of this encounter: 54.9 kg.  Malnutrition Type:  Nutrition Problem: Increased nutrient needs Etiology: chronic illness, cancer and cancer related treatments   Malnutrition Characteristics:  Signs/Symptoms: estimated needs   Nutrition Interventions:  Interventions: MVI, Other (Comment)(Kate  Farms)  Radiology Studies: No results found. Scheduled Meds: . Chlorhexidine Gluconate Cloth  6 each Topical Daily  . dronabinol  2.5 mg Oral BID AC  . DULoxetine  20 mg Oral Daily  . entecavir  0.5 mg Oral  Daily  . feeding supplement (KATE FARMS STANDARD 1.4)  325 mL Oral BID BM  . heparin  5,000 Units Subcutaneous Q12H  .  HYDROmorphone (DILAUDID) injection  0.5 mg Intravenous Q4H  . lactulose  30 g Oral TID  . metoCLOPramide  5 mg Oral TID AC & HS  . multivitamin with minerals  1 tablet Oral Daily  . oxyCODONE  15 mg Oral Q8H  . polyethylene glycol  17 g Oral BID  . senna-docusate  2 tablet Oral BID   Continuous Infusions: . sodium chloride 75 mL/hr at 09/06/19 0847    LOS: 6 days   Domenic Polite, MD

## 2019-09-06 NOTE — Progress Notes (Signed)
MEWS Guidelines - (patients age 40 and over)    Pt tachycardic at times. Elevated HR triggered yellow MEWS protocol. VS frequency increased per protocol. Pt stable with no s/s of distress or discomfort. Will continue to monitor.

## 2019-09-06 NOTE — Progress Notes (Signed)
MEWS Guidelines - (patients age 40 and over)    Pt's VS triggered yellow MEWS response. Pt c/o pain, prn meds given. Will continue to monitor.

## 2019-09-07 DIAGNOSIS — R1013 Epigastric pain: Secondary | ICD-10-CM

## 2019-09-07 NOTE — Progress Notes (Addendum)
PROGRESS NOTE    Kai Furukawa  [id number] DOB: 06/02/1980 DOA: 08/30/2019 PCP: Patient, No Pcp Per  Brief Narrative:  Caid Henline is a 39 y.o. male with medical history significant of hepatocellular carcinoma, chronic hepatitis B, liver cirrhosis, history of non-Hodgkin lymphoma who presented with ongoing abdominal pain in setting malignancy and nausea/ vomiting.    Assessment & Plan:  Progressive Metastatic hepatocellular carcinoma Chronic hepatitis B Liver cirrhosis Hepatic encephalopathy Worsening abdominal pain, nausea -Followed by oncology Dr. Burr Medico was on palliative immunotherapy -Admitted with worsening symptoms, pain and nausea -Repeat CT abdomen 4/14 noted extensive tumor burden within the liver is progressive compared to imaging in March and new upper abdominal metastatic adenopathy noted -OxyContin dose increased again to 20 mg every 8, limited by drowsiness, started IV dilaudid -Continue lactulose at higher dose, mental status improving -Oncology following and prognosis is felt to be weeks,  recommended Hospice -Discussed poor prognosis with patient's wife and mother, continue to recommend comfort focused care -recommended consideration of DNR , wife is realistic but still remains hopeful   -Hypercalcemia -Had initially resolved with hydration, now with worsening,  -Given Zometa x1 on 4/15, and IVF -Slight improvement noted in calcium  Constipation -Secondary to narcotics -Continue Senokot, MiraLAX, lactulose dosing -Now resolved  History of T-cell non-Hodgkin's lymphoma -Status post chemotherapy, no active issues  Normocytic Anemia -Stable  DVT prophylaxis: Subcu heparin Code Status: FULL CODE , recommended DNR again Family Communication: Discussed with mother and wife at bedside Disposition Plan: Patient is from home, is terminally ill with progressive hepatocellular carcinoma, failed to multiple lines of chemo and immunotherapy, continue  palliative care discussions, ideally needs residential hospice  Consultants:   Medical Oncology  Palliative Care Medicine  Interventional Radiology    Procedures: Port-A-Cath placement to be done in the morning  Antimicrobials:  Anti-infectives (From admission, onward)   Start     Dose/Rate Route Frequency Ordered Stop   09/01/19 1500  ceFAZolin (ANCEF) IVPB 2g/100 mL premix     2 g 200 mL/hr over 30 Minutes Intravenous To Radiology 08/31/19 1826 09/01/19 1240   08/31/19 1000  entecavir (BARACLUDE) tablet 0.5 mg     0.5 mg Oral Daily 08/30/19 1226       Subjective: -Oral intake is poor, drinking a little bit according to mother -Had a bowel movement yesterday, none so far today  Objective: Vitals:   09/07/19 0122 09/07/19 0604 09/07/19 1157 09/07/19 1412  BP: 125/89 123/84 123/89 112/83  Pulse: (!) 133 (!) 125 (!) 116 (!) 117  Resp: 17 17 18 18   Temp: 97.9 F (36.6 C) 99.2 F (37.3 C) 98.7 F (37.1 C) 98 F (36.7 C)  TempSrc: Oral Oral Oral Oral  SpO2: 97% 97% 97% 98%  Weight:      Height:        Intake/Output Summary (Last 24 hours) at 09/07/2019 1422 Last data filed at 09/07/2019 1255 Gross per 24 hour  Intake 900 ml  Output 850 ml  Net 50 ml   Filed Weights   08/30/19 0813  Weight: 54.9 kg   Examination: Physical Exam: Gen: Extremely frail chronically ill-appearing American male, sitting up in bed, awake less alert, little more interactive today, uncomfortable appearing HEENT: Positive icterus Lungs: Poor air movement bilaterally CVS: S1-S2, regular rhythm, tachycardic Abd: Soft, right-sided tenderness, mildly distended Extremities: 1+ edema  Skin: no new rashes on exposed skin   Data Reviewed: I have personally reviewed following labs and imaging studies  CBC: Recent  Labs  Lab 08/31/19 1723 08/31/19 1723 09/01/19 0540 09/02/19 0533 09/03/19 0500 09/04/19 0522 09/05/19 0829  WBC 16.7*   < > 16.7* 16.3* 12.4* 11.2* 12.8*  NEUTROABS 13.3*   --  13.5* 12.8* 9.4*  --   --   HGB 11.6*   < > 11.1* 11.0* 11.0* 11.7* 12.5*  HCT 36.5*   < > 34.7* 34.6* 34.7* 36.6* 40.2  MCV 86.5   < > 85.3 84.8 85.3 85.7 87.2  PLT 287   < > 267 270 286 315 299   < > = values in this interval not displayed.   Basic Metabolic Panel: Recent Labs  Lab 09/01/19 0540 09/01/19 0540 09/02/19 0533 09/03/19 0500 09/04/19 0522 09/05/19 0829 09/06/19 0835  NA 132*   < > 128* 128* 131* 131* 128*  K 4.2   < > 3.9 4.0 4.7 4.9 4.7  CL 101   < > 98 101 101 102 102  CO2 19*   < > 22 21* 22 21* 21*  GLUCOSE 83   < > 89 99 101* 91 92  BUN 11   < > 10 8 10 13 13   CREATININE 0.56*   < > 0.50* 0.42* 0.53* 0.57* 0.52*  CALCIUM 10.1   < > 10.1 10.1 11.2* 11.8* 11.0*  MG 2.2  --  2.0 1.9  --   --   --   PHOS 1.1*  --  1.3* 1.3*  --   --   --    < > = values in this interval not displayed.   GFR: Estimated Creatinine Clearance: 96.3 mL/min (A) (by C-G formula based on SCr of 0.52 mg/dL (L)). Liver Function Tests: Recent Labs  Lab 09/02/19 0533 09/03/19 0500 09/04/19 0522 09/05/19 0829 09/06/19 0835  AST 102* 119* 128* 168* 212*  ALT 24 31 36 40 43  ALKPHOS 100 132* 141* 134* 145*  BILITOT 0.7 0.7 0.9 0.8 1.1  PROT 7.8 7.7 8.7* 9.4* 8.7*  ALBUMIN 2.2* 2.1* 2.3* 2.6* 2.4*   No results for input(s): LIPASE, AMYLASE in the last 168 hours. Recent Labs  Lab 09/03/19 1122 09/06/19 1257  AMMONIA 60* 57*   Coagulation Profile: Recent Labs  Lab 09/01/19 0540  INR 1.4*   Cardiac Enzymes: No results for input(s): CKTOTAL, CKMB, CKMBINDEX, TROPONINI in the last 168 hours. BNP (last 3 results) No results for input(s): PROBNP in the last 8760 hours. HbA1C: No results for input(s): HGBA1C in the last 72 hours. CBG: No results for input(s): GLUCAP in the last 168 hours. Lipid Profile: No results for input(s): CHOL, HDL, LDLCALC, TRIG, CHOLHDL, LDLDIRECT in the last 72 hours. Thyroid Function Tests: No results for input(s): TSH, T4TOTAL, FREET4,  T3FREE, THYROIDAB in the last 72 hours. Anemia Panel: No results for input(s): VITAMINB12, FOLATE, FERRITIN, TIBC, IRON, RETICCTPCT in the last 72 hours. Sepsis Labs: No results for input(s): PROCALCITON, LATICACIDVEN in the last 168 hours.  Recent Results (from the past 240 hour(s))  SARS CORONAVIRUS 2 (TAT 6-24 HRS) Nasopharyngeal Nasopharyngeal Swab     Status: None   Collection Time: 08/30/19  5:02 PM   Specimen: Nasopharyngeal Swab  Result Value Ref Range Status   SARS Coronavirus 2 NEGATIVE NEGATIVE Final    Comment: (NOTE) SARS-CoV-2 target nucleic acids are NOT DETECTED. The SARS-CoV-2 RNA is generally detectable in upper and lower respiratory specimens during the acute phase of infection. Negative results do not preclude SARS-CoV-2 infection, do not rule out co-infections with other pathogens, and should  not be used as the sole basis for treatment or other patient management decisions. Negative results must be combined with clinical observations, patient history, and epidemiological information. The expected result is Negative. Fact Sheet for Patients: SugarRoll.be Fact Sheet for Healthcare Providers: https://www.woods-mathews.com/ This test is not yet approved or cleared by the [place] FDA and  has been authorized for detection and/or diagnosis of SARS-CoV-2 by FDA under an Emergency Use Authorization (EUA). This EUA will remain  in effect (meaning this test can be used) for the duration of the COVID-19 declaration under Section 56 4(b)(1) of the Act, 21 U.S.C. section 360bbb-3(b)(1), unless the authorization is terminated or revoked sooner. Performed at [facility], [place] [address], [place], [place] [address]   Culture, Urine     Status: Abnormal   Collection Time: 08/31/19  4:37 PM   Specimen: Urine, Random  Result Value Ref Range Status   Specimen Description   Final    URINE, RANDOM Performed at [facility] [address] [place], [place] [address]    Special Requests   Final    NONE Performed at [facility], [place] [address], [place], [place] [address]    Culture (A)  Final    <10,000 COLONIES/mL INSIGNIFICANT GROWTH Performed at [facility] [address], [place], [place] [address]    Report Status 09/02/2019 FINAL  Final  Culture, blood (routine x 2)     Status: None   Collection Time: 08/31/19  5:23 PM   Specimen: BLOOD  Result Value Ref Range Status   Specimen Description   Final    BLOOD RIGHT ARM Performed at [facility] [address], [place], [place] [address]    Special Requests   Final    BOTTLES DRAWN AEROBIC AND ANAEROBIC Blood Culture adequate volume Performed at [facility] [address] [place], [place] [address]    Culture   Final    NO GROWTH 5 DAYS Performed at [facility], [place] [address] [place], [place] [address]    Report Status 09/05/2019 FINAL  Final  Culture, blood (routine x 2)     Status: None   Collection Time: 08/31/19  5:23 PM   Specimen: BLOOD  Result Value Ref Range Status   Specimen Description   Final    BLOOD RIGHT HAND Performed at [facility] [address] [place], [place] [address]    Special Requests   Final    BOTTLES DRAWN AEROBIC AND ANAEROBIC Blood Culture adequate volume Performed at [facility] [address] [place], [place] [address]    Culture   Final    NO GROWTH 5 DAYS Performed at [facility], [place] [address] [place], [place] [address]    Report Status 09/05/2019 FINAL  Final     RN Pressure Injury Documentation:     Estimated body mass index is 19.53 kg/m as calculated from the following:   Height as of this encounter: 5\' 6"  (1.676 m).   Weight as of this encounter: 54.9 kg.  Malnutrition Type:  Nutrition Problem: Increased nutrient needs Etiology: chronic  illness, cancer and cancer related treatments   Malnutrition Characteristics:  Signs/Symptoms: estimated needs   Nutrition Interventions:  Interventions: MVI, Other (Comment)([place])  Radiology Studies: No results found. Scheduled Meds: . Chlorhexidine Gluconate Cloth  6 each Topical Daily  . dronabinol  2.5 mg Oral BID AC  .  DULoxetine  20 mg Oral Daily  . entecavir  0.5 mg Oral Daily  . feeding supplement (KATE FARMS STANDARD 1.4)  325 mL Oral BID BM  . heparin  5,000 Units Subcutaneous Q12H  .  HYDROmorphone (DILAUDID) injection  0.5 mg Intravenous Q4H  . lactulose  30 g Oral TID  . metoCLOPramide  5 mg Oral TID AC & HS  . multivitamin with minerals  1 tablet Oral Daily  . oxyCODONE  15 mg Oral Q8H  . polyethylene glycol  17 g Oral BID  . senna-docusate  2 tablet Oral BID   Continuous Infusions: . sodium chloride 75 mL/hr at 09/07/19 1200    LOS: 7 days   Domenic Polite, MD

## 2019-09-07 NOTE — Progress Notes (Signed)
Daily Progress Note   Patient Name: Maurice Lowe       Date: 09/07/2019 DOB: Sep 06, 1980  Age: 39 y.o. MRN#: [id number] Attending Physician: Domenic Polite, MD Primary Care Physician: Patient, No Pcp Per Admit Date: 08/30/2019  Reason for Consultation/Follow-up: Establishing goals of care And pain management.   Subjective: Not as awake as yesterday Pain much better controlled.  On IV PRN as well as scheduled IV Dilaudid.  Discussed with mother and wife, see below.    Length of Stay: 7  Current Medications: Scheduled Meds:  . Chlorhexidine Gluconate Cloth  6 each Topical Daily  . dronabinol  2.5 mg Oral BID AC  . DULoxetine  20 mg Oral Daily  . entecavir  0.5 mg Oral Daily  . feeding supplement (KATE FARMS STANDARD 1.4)  325 mL Oral BID BM  . heparin  5,000 Units Subcutaneous Q12H  .  HYDROmorphone (DILAUDID) injection  0.5 mg Intravenous Q4H  . lactulose  30 g Oral TID  . metoCLOPramide  5 mg Oral TID AC & HS  . multivitamin with minerals  1 tablet Oral Daily  . oxyCODONE  15 mg Oral Q8H  . polyethylene glycol  17 g Oral BID  . senna-docusate  2 tablet Oral BID    Continuous Infusions: . sodium chloride 75 mL/hr at 09/07/19 0711    PRN Meds: acetaminophen **OR** acetaminophen, alum & mag hydroxide-simeth, bisacodyl, HYDROmorphone (DILAUDID) injection, lidocaine-prilocaine, LORazepam, ondansetron (ZOFRAN) IV, ondansetron, oxyCODONE, prochlorperazine  Physical Exam         Appears with generalized weakness Regular work of breathing S1 S2 Has generalized edema Abdomen distended Muscle wasting cancer cachexia evident Awakens some  Vital Signs: BP 123/84 (BP Location: Left Arm)   Pulse (!) 125   Temp 99.2 F (37.3 C) (Oral)   Resp 17   Ht 5\' 6"  (1.676 m)   Wt  54.9 kg   SpO2 97%   BMI 19.53 kg/m  SpO2: SpO2: 97 % O2 Device: O2 Device: Room Air O2 Flow Rate: O2 Flow Rate (L/min): 2 L/min  Intake/output summary:   Intake/Output Summary (Last 24 hours) at 09/07/2019 1036 Last data filed at 09/07/2019 0700 Gross per 24 hour  Intake 900 ml  Output 350 ml  Net 550 ml   LBM: Last BM Date: 09/04/19 Baseline Weight: Weight:  54.9 kg Most recent weight: Weight: 54.9 kg       Palliative Assessment/Data:      Patient Active Problem List   Diagnosis Date Noted  . Constipation   . Hypercalcemia 08/30/2019  . Hepatocellular carcinoma ([place]) 07/30/2019  . Goals of care, counseling/discussion 07/30/2019  . Personal history of non-Hodgkin lymphomas 08/04/2018  . RUQ abdominal pain 12/15/2016  . Chronic viral hepatitis B without delta agent and without coma ([place]) 12/15/2016  . Liver lesion 12/15/2016  . Elevated LFTs   . Abdominal pain, epigastric     Palliative Care Assessment & Plan   Patient Profile:  39 year old gentleman with life limiting illness of chronic hepatocellular carcinoma, liver cirrhosis, chronic hepatitis B with symptom burden of worsening abdominal pain and nausea.  Patient is being followed by Dr. Burr Medico from medical oncology.  He was on palliative immunotherapy.  Repeat imaging has shown extensive tumor burden within the liver progressive compared to imaging in March, new upper abdominal metastatic adenopathy also noted.  Patient requiring lactulose for his mental status.  Hospital course also complicated by hypercalcemia, was given hydration was also given Zometa.  Also on a bowel regimen.  Also has history of T-cell non-Hodgkin's lymphoma status post chemotherapy.  From a medical oncology perspective, it is recommended for the patient to consider comfort care/hospice.  He is noted to have cancer progression with markedly limited prognosis.  A palliative consult has been requested for additional discussions, aggressive  symptom management, disposition options discussions and addressing goals of care.  Assessment:  abdominal pain Nausea Generalized weakness Functional decline Cancer cachexia.  Likely approaching end of life.   Recommendations/Plan:   continue current pain and non pain symptom management, patient has required 13.5 mg IV Dilaudid in the past 24 hours, additionally has also received 45 mg PO OxyIR as well.   Discussed about code status again with family. Family states that they are aware of how seriously and irreversibly ill the patient is. However, they remain hopeful for healing and miracle. Remains full code for now.   Anticipated hospital death. Patient continues to decline very rapidly, I worry that he might not survive this hospitalization and would not be able to be safely transferred to hospice. I have gently discussed this with family today.     Code Status:    Code Status Orders  (From admission, onward)         Start     Ordered   08/30/19 1226  Full code  Continuous     08/30/19 1226        Code Status History    Date Active Date Inactive Code Status Order ID Comments User Context   12/09/2015 2351 12/12/2015 1713 Full Code [id number]  Vianne Bulls, MD Inpatient   Advance Care Planning Activity       Prognosis:   hours to days.   Discharge Planning:  I worry about patient not surviving this hospitalization, family very distraught whenever code status discussions are brought up.   Care plan was discussed with patient and family.   Thank you for allowing the Palliative Medicine Team to assist in the care of this patient.   Time In:  10 Time Out: 10.35 Total Time 35 Prolonged Time Billed  no       Greater than 50%  of this time was spent counseling and coordinating care related to the above assessment and plan.  Loistine Chance, MD  Please contact Palliative Medicine Team phone at [phone]  for questions and concerns.

## 2019-09-08 DIAGNOSIS — R531 Weakness: Secondary | ICD-10-CM

## 2019-09-08 DIAGNOSIS — Z515 Encounter for palliative care: Secondary | ICD-10-CM | POA: Diagnosis not present

## 2019-09-08 DIAGNOSIS — Z7189 Other specified counseling: Secondary | ICD-10-CM | POA: Diagnosis not present

## 2019-09-08 DIAGNOSIS — C220 Liver cell carcinoma: Secondary | ICD-10-CM | POA: Diagnosis not present

## 2019-09-08 DIAGNOSIS — R1084 Generalized abdominal pain: Secondary | ICD-10-CM

## 2019-09-08 LAB — COMPREHENSIVE METABOLIC PANEL
ALT: 46 U/L — ABNORMAL HIGH (ref 0–44)
AST: 234 U/L — ABNORMAL HIGH (ref 15–41)
Albumin: 2.1 g/dL — ABNORMAL LOW (ref 3.5–5.0)
Alkaline Phosphatase: 137 U/L — ABNORMAL HIGH (ref 38–126)
Anion gap: 7 (ref 5–15)
BUN: 15 mg/dL (ref 6–20)
CO2: 21 mmol/L — ABNORMAL LOW (ref 22–32)
Calcium: 11 mg/dL — ABNORMAL HIGH (ref 8.9–10.3)
Chloride: 102 mmol/L (ref 98–111)
Creatinine, Ser: 0.51 mg/dL — ABNORMAL LOW (ref 0.61–1.24)
GFR calc Af Amer: >60 mL/min (ref >60)
GFR calc non Af Amer: >60 mL/min (ref >60)
Glucose, Bld: 94 mg/dL (ref 70–99)
Potassium: 4.1 mmol/L (ref 3.5–5.1)
Sodium: 130 mmol/L — ABNORMAL LOW (ref 135–145)
Total Bilirubin: 1.1 mg/dL (ref 0.3–1.2)
Total Protein: 8.2 g/dL — ABNORMAL HIGH (ref 6.5–8.1)

## 2019-09-08 LAB — CBC
HCT: 33.9 % — ABNORMAL LOW (ref 39.0–52.0)
Hemoglobin: 10.7 g/dL — ABNORMAL LOW (ref 13.0–17.0)
MCH: 27.7 pg (ref 26.0–34.0)
MCHC: 31.6 g/dL (ref 30.0–36.0)
MCV: 87.8 fL (ref 80.0–100.0)
Platelets: 215 10*3/uL (ref 150–400)
RBC: 3.86 MIL/uL — ABNORMAL LOW (ref 4.22–5.81)
RDW: 15.7 % — ABNORMAL HIGH (ref 11.5–15.5)
WBC: 19.6 10*3/uL — ABNORMAL HIGH (ref 4.0–10.5)
nRBC: 0 % (ref 0.0–0.2)

## 2019-09-08 MED ORDER — HYDROMORPHONE BOLUS VIA INFUSION
0.5000 mg | INTRAVENOUS | Status: DC | PRN
  Administered 2019-09-09: 0.5 mg via INTRAVENOUS
  Filled 2019-09-08: qty 1

## 2019-09-08 MED ORDER — SODIUM CHLORIDE 0.9 % IV SOLN
0.5000 mg/h | INTRAVENOUS | Status: DC
  Administered 2019-09-08: 0.5 mg/h via INTRAVENOUS
  Filled 2019-09-08: qty 5

## 2019-09-08 NOTE — Progress Notes (Addendum)
PMT no charge addendum. Discussed with Dr Burr Medico from med onc as well as TRH MD.  Patient and family now accepting of DNR DNI. Code status now changed to reflect DNR DNI wishes. Agree completely.  Plan is likely for home with hospice, in my opinion, patient will need continuous IV Dilaudid, his opioid requirements with IV Dilaudid both PRN and scheduled have gone up considerably in the past 24-48 hours or so. Hence, will initiate IV Dilaudid infusion and also will start Dilaudid bolus as well. Will D/C all PO pain regimen. TOC consulted to correspond with hospice, to see if home with hospice and IV continuous Dilaudid at home can be set up. Discussed with TOC, will leave script for IV Dilaudid at home on the chart to start arrangements for home with hospice.   Loistine Chance MD PMT [phone] No charge.

## 2019-09-08 NOTE — Progress Notes (Signed)
Physical Therapy Treatment Patient Details Name: Maurice Lowe MRN: [id number] DOB: 1980/06/23 Today's Date: 09/08/2019    History of Present Illness Maurice Lowe is a 39 y.o. male with medical history significant of hepatocellular carcinoma, hepatitis B, history of non-Hodgkin lymphoma who presents with ongoing abdominal pain in setting malignancy and nausea/ vomiting.  Patient reports he has been dealing with pain for quite a while now and his oncology team has been titrating his pain medications but he still maintains that he has severe epigastric pain at times and on occasion to his left side.  He states that for the past 1 week he has been mostly sedentary because movement exacerbates the pain and he has to be somewhat of an incline position to tolerate.  He reports he is also not had any real food for over a week now and has been vomiting every 4 hours.  He denies any fevers, chills but reports that he has occasional hot flashes.  He does not report any particular trigger to this episode.  It is unclear if he filled his duloxetine and or some of the pain medication changes from his last visit were implemented.  He does report constipation and reports his last bowel movement was 4 days ago but also has not eaten much in over a week.    PT Comments    Patient continues to be more limited with acute therapy services due to ongoing decline in function. Pt remains tachycardic at rest and activity was limited to bed exercises. This PT discussed with pt if he wishes to continue with acute therapy services during his stay and he requested we continue to work with him 2x during session. Despite intermittent moments of alert and clear conversation/responses to questions pt also has cognitive changes and stated "need to make the screen go away" and "thers is a dog down there" pointing at his yellow sock. Patient and family are now agreeable to home with hospice services after multiple conversations  with palliative care and acute PT will continue to discuss with pt/family whether acute PT remains appropriate and review ROM exercises/activities with family.    Follow Up Recommendations  No PT follow up(pt plans to return home with hospice services.)     Equipment Recommendations  Other (comment)(RW, hospice to evaluate for needs)    Recommendations for Other Services       Precautions / Restrictions Precautions Precautions: Fall Restrictions Weight Bearing Restrictions: No    Mobility  Bed Mobility        General bed mobility comments: limited to bed exercises  Transfers         Ambulation/Gait      Stairs     Wheelchair Mobility    Modified Rankin (Stroke Patients Only)          Cognition Arousal/Alertness: Awake/alert Behavior During Therapy: WFL for tasks assessed/performed Overall Cognitive Status: Impaired/Different from baseline Area of Impairment: Orientation;Memory;Following commands;Problem solving                 Orientation Level: Disoriented to;Time   Memory: Decreased short-term memory Following Commands: Follows one step commands inconsistently;Follows one step commands with increased time     Problem Solving: Slow processing;Decreased initiation;Difficulty sequencing;Requires verbal cues        Exercises General Exercises - Lower Extremity Quad Sets: AROM;Both;5 reps;Other (comment)(pt kicking LE into extension after assist heel slide) Heel Slides: AAROM;Both;5 reps Straight Leg Raises: Both;10 reps;AAROM;AROM Other Exercises Other Exercises: attempted scapular retraction activaiton in  supine with tactile cue at spine. pt having difficulty following cues. Other Exercises: attempted bil hand digit opposition but pt with difficulty sequencing and unable to bring index and thumb together on Rt hand. unable to complete any opposition smoothly with Lt hand. Other Exercises: bil gastroc stretching: 3 reps bil, 30 sec holds  PROM.    General Comments General comments (skin integrity, edema, etc.): Pt HR resting in high 110's and low 120's in supine. Pt wishes to continue with Acute PT services despite prognosis. Limited activity to bed level exercise and stretching. Pt's HR increasd to 128 bpm max with bed exercsies but typically remained between 117-123 bpm.       Pertinent Vitals/Pain Pain Assessment: Faces Faces Pain Scale: Hurts little more Pain Location: abdomen Pain Descriptors / Indicators: Discomfort Pain Intervention(s): Limited activity within patient's tolerance;Monitored during session           PT Goals (current goals can now be found in the care plan section) Acute Rehab PT Goals Patient Stated Goal: back home with spouse/family to assist PT Goal Formulation: With patient/family Time For Goal Achievement: 2019/09/18(date extended and goals updated) Potential to Achieve Goals: Fair Progress towards PT goals: Not progressing toward goals - comment;Goals downgraded-see care plan(pt declining with poor medical prognosis)    Frequency    Min 2X/week      PT Plan Current plan remains appropriate    Co-evaluation              AM-PAC PT "6 Clicks" Mobility   Outcome Measure  Help needed turning from your back to your side while in a flat bed without using bedrails?: A Lot Help needed moving from lying on your back to sitting on the side of a flat bed without using bedrails?: A Lot Help needed moving to and from a bed to a chair (including a wheelchair)?: Total Help needed standing up from a chair using your arms (e.g., wheelchair or bedside chair)?: Total Help needed to walk in hospital room?: Total Help needed climbing 3-5 steps with a railing? : Total 6 Click Score: 8    End of Session   Activity Tolerance: Treatment limited secondary to medical complications (Comment)(limited to bed level due to tachycardia) Patient left: in bed;with bed alarm set;with call bell/phone within  reach Nurse Communication: Mobility status PT Visit Diagnosis: Unsteadiness on feet (R26.81);Other abnormalities of gait and mobility (R26.89);Pain Pain - Right/Left: Right     Time: [id number] PT Time Calculation (min) (ACUTE ONLY): 18 min  Charges:  $Therapeutic Exercise: 8-22 mins                     Verner Mould, DPT Physical Therapist with [facility] [phone]  09/08/2019 2:13 PM

## 2019-09-08 NOTE — Progress Notes (Signed)
PROGRESS NOTE    Maurice Lowe  [id number] DOB: [birth year] DOA: 08/30/2019 PCP: Patient, No Pcp Per  Brief Narrative:  Maurice Lowe is a 39 y.o. male with medical history significant of hepatocellular carcinoma, chronic hepatitis B, liver cirrhosis, history of non-Hodgkin lymphoma who presented with ongoing abdominal pain in setting malignancy and nausea/ vomiting.    Assessment & Plan:  Progressive Metastatic hepatocellular carcinoma Chronic hepatitis B Liver cirrhosis Hepatic encephalopathy Worsening abdominal pain, nausea -Followed by oncology Dr. Burr Medico was on palliative immunotherapy -Admitted with worsening symptoms, pain and nausea -Repeat CT abdomen 4/14 noted extensive tumor burden within the liver is progressive compared to imaging in March and new upper abdominal metastatic adenopathy noted -OxyContin dose increased again to 20 mg every 8, limited by drowsiness, started IV dilaudid -Continue lactulose at higher dose, mental status improving -Oncology following and prognosis is felt to be weeks,  recommended Hospice -We have continued to discuss patient's poor prognosis with his wife and mother on multiple occasions, recommended DNR and comfort focused care, they continue to struggle with these decisions  -Hypercalcemia -Had initially resolved with hydration, now with worsening,  -Given Zometa x1 on 4/15, and IVF -Slight improvement noted in calcium  Constipation -Secondary to narcotics -Continue Senokot, MiraLAX, lactulose dosing -Now resolved  History of T-cell non-Hodgkin's lymphoma -Status post chemotherapy, no active issues  Normocytic Anemia -Stable  DVT prophylaxis: Subcu heparin Code Status: FULL CODE , recommended DNR again Family Communication: Discussed with mother and wife at bedside Disposition Plan: Patient is from home, is terminally ill with progressive hepatocellular carcinoma, failed to multiple lines of chemo and immunotherapy,  continue palliative care discussions, hospice has been discussed with family on multiple occasions, they are hopeful of recovery Consultants:   Medical Oncology  Palliative Care Medicine  Interventional Radiology    Procedures: Port-A-Cath placement to be done in the morning  Antimicrobials:  Anti-infectives (From admission, onward)   Start     Dose/Rate Route Frequency Ordered Stop   09/01/19 1500  ceFAZolin (ANCEF) IVPB 2g/100 mL premix     2 g 200 mL/hr over 30 Minutes Intravenous To Radiology 08/31/19 1826 09/01/19 1240   08/31/19 1000  entecavir (BARACLUDE) tablet 0.5 mg     0.5 mg Oral Daily 08/30/19 1226       Subjective: -According to family is little bit more interactive, having bowel movements, pain is slightly better controlled Objective: Vitals:   09/07/19 1412 09/07/19 2142 09/08/19 0530 09/08/19 1005  BP: 112/83 133/87 125/84 112/78  Pulse: (!) 117 (!) 123 (!) 119 (!) 117  Resp: 18 16 16 20   Temp: 98 F (36.7 C) 99.1 F (37.3 C) 98.1 F (36.7 C) 98.5 F (36.9 C)  TempSrc: Oral Oral Oral Oral  SpO2: 98% 97% 97% 98%  Weight:      Height:        Intake/Output Summary (Last 24 hours) at 09/08/2019 1302 Last data filed at 09/08/2019 0131 Gross per 24 hour  Intake 1291.2 ml  Output --  Net 1291.2 ml   Filed Weights   08/30/19 0813  Weight: 54.9 kg   Examination: Physical Exam: Gen: Extremely frail, chronically ill African-American male, sitting up in bed, more awake, minimally interactive, uncomfortable appearing HEENT: No JVD PE Lungs: Poor air movement bilaterally, diminished at the bases   CVS: S1-S2, regular rhythm, tachycardic Abd: Soft, right-sided tenderness, mildly distended, bowel sounds present Extremities: No edema Skin: no new rashes on exposed skin   Data Reviewed: I  have personally reviewed following labs and imaging studies  CBC: Recent Labs  Lab 09/02/19 0533 09/03/19 0500 09/04/19 0522 09/05/19 0829 09/08/19 0615  WBC  16.3* 12.4* 11.2* 12.8* 19.6*  NEUTROABS 12.8* 9.4*  --   --   --   HGB 11.0* 11.0* 11.7* 12.5* 10.7*  HCT 34.6* 34.7* 36.6* 40.2 33.9*  MCV 84.8 85.3 85.7 87.2 87.8  PLT 270 286 315 299 [id number]   Basic Metabolic Panel: Recent Labs  Lab 09/02/19 0533 09/02/19 0533 09/03/19 0500 09/04/19 0522 09/05/19 0829 09/06/19 0835 09/08/19 0615  NA 128*   < > 128* 131* 131* 128* 130*  K 3.9   < > 4.0 4.7 4.9 4.7 4.1  CL 98   < > 101 101 102 102 102  CO2 22   < > 21* 22 21* 21* 21*  GLUCOSE 89   < > 99 101* 91 92 94  BUN 10   < > 8 10 13 13 15   CREATININE 0.50*   < > 0.42* 0.53* 0.57* 0.52* 0.51*  CALCIUM 10.1   < > 10.1 11.2* 11.8* 11.0* 11.0*  MG 2.0  --  1.9  --   --   --   --   PHOS 1.3*  --  1.3*  --   --   --   --    < > = values in this interval not displayed.   GFR: Estimated Creatinine Clearance: 96.3 mL/min (A) (by C-G formula based on SCr of 0.51 mg/dL (L)). Liver Function Tests: Recent Labs  Lab 09/03/19 0500 09/04/19 0522 09/05/19 0829 09/06/19 0835 09/08/19 0615  AST 119* 128* 168* 212* 234*  ALT 31 36 40 43 46*  ALKPHOS 132* 141* 134* 145* 137*  BILITOT 0.7 0.9 0.8 1.1 1.1  PROT 7.7 8.7* 9.4* 8.7* 8.2*  ALBUMIN 2.1* 2.3* 2.6* 2.4* 2.1*   No results for input(s): LIPASE, AMYLASE in the last 168 hours. Recent Labs  Lab 09/03/19 1122 09/06/19 1257  AMMONIA 60* 57*   Coagulation Profile: No results for input(s): INR, PROTIME in the last 168 hours. Cardiac Enzymes: No results for input(s): CKTOTAL, CKMB, CKMBINDEX, TROPONINI in the last 168 hours. BNP (last 3 results) No results for input(s): PROBNP in the last 8760 hours. HbA1C: No results for input(s): HGBA1C in the last 72 hours. CBG: No results for input(s): GLUCAP in the last 168 hours. Lipid Profile: No results for input(s): CHOL, HDL, LDLCALC, TRIG, CHOLHDL, LDLDIRECT in the last 72 hours. Thyroid Function Tests: No results for input(s): TSH, T4TOTAL, FREET4, T3FREE, THYROIDAB in the last 72  hours. Anemia Panel: No results for input(s): VITAMINB12, FOLATE, FERRITIN, TIBC, IRON, RETICCTPCT in the last 72 hours. Sepsis Labs: No results for input(s): PROCALCITON, LATICACIDVEN in the last 168 hours.  Recent Results (from the past 240 hour(s))  SARS CORONAVIRUS 2 (TAT 6-24 HRS) Nasopharyngeal Nasopharyngeal Swab     Status: None   Collection Time: 08/30/19  5:02 PM   Specimen: Nasopharyngeal Swab  Result Value Ref Range Status   SARS Coronavirus 2 NEGATIVE NEGATIVE Final    Comment: (NOTE) SARS-CoV-2 target nucleic acids are NOT DETECTED. The SARS-CoV-2 RNA is generally detectable in upper and lower respiratory specimens during the acute phase of infection. Negative results do not preclude SARS-CoV-2 infection, do not rule out co-infections with other pathogens, and should not be used as the sole basis for treatment or other patient management decisions. Negative results must be combined with clinical observations, patient history, and epidemiological information.  The expected result is Negative. Fact Sheet for Patients: SugarRoll.be Fact Sheet for Healthcare Providers: https://www.woods-mathews.com/ This test is not yet approved or cleared by the [place] FDA and  has been authorized for detection and/or diagnosis of SARS-CoV-2 by FDA under an Emergency Use Authorization (EUA). This EUA will remain  in effect (meaning this test can be used) for the duration of the COVID-19 declaration under Section 56 4(b)(1) of the Act, 21 U.S.C. section 360bbb-3(b)(1), unless the authorization is terminated or revoked sooner. Performed at [facility], [place] [address], [place], [place] [address]   Culture, Urine     Status: Abnormal   Collection Time: 08/31/19  4:37 PM   Specimen: Urine, Random  Result Value Ref Range Status   Specimen Description   Final    URINE, RANDOM Performed at [place]  [address] [place], [place] [address]    Special Requests   Final    NONE Performed at [facility], [place] [address], [place], [place] [address]    Culture (A)  Final    <10,000 COLONIES/mL INSIGNIFICANT GROWTH Performed at [place] [address], [place], [place] [address]    Report Status 09/02/2019 FINAL  Final  Culture, blood (routine x 2)     Status: None   Collection Time: 08/31/19  5:23 PM   Specimen: BLOOD  Result Value Ref Range Status   Specimen Description   Final    BLOOD RIGHT ARM Performed at [facility] [address], [place], [place] [address]    Special Requests   Final    BOTTLES DRAWN AEROBIC AND ANAEROBIC Blood Culture adequate volume Performed at [facility] [address] [place], [place] [address]    Culture   Final    NO GROWTH 5 DAYS Performed at [facility], [place] [address], [place], [place] [address]    Report Status 09/05/2019 FINAL  Final  Culture, blood (routine x 2)     Status: None   Collection Time: 08/31/19  5:23 PM   Specimen: BLOOD  Result Value Ref Range Status   Specimen Description   Final    BLOOD RIGHT HAND Performed at [facility] [address], [place], [place] [address]    Special Requests   Final    BOTTLES DRAWN AEROBIC AND ANAEROBIC Blood Culture adequate volume Performed at [facility] [address], [place], [place] [address]    Culture   Final    NO GROWTH 5 DAYS Performed at [facility], [place] [address] [place], [place] [address]    Report Status 09/05/2019 FINAL  Final     RN Pressure Injury Documentation:     Estimated body mass index is 19.53 kg/m as calculated from the following:   Height as of this encounter: 5\' 6"  (1.676 m).   Weight as of this encounter: 54.9 kg.  Malnutrition Type:  Nutrition Problem: Increased nutrient needs Etiology: chronic illness, cancer and cancer  related treatments   Malnutrition Characteristics:  Signs/Symptoms: estimated needs   Nutrition Interventions:  Interventions: MVI, Other (Comment)([place])  Radiology Studies: No results found. Scheduled Meds: . Chlorhexidine Gluconate Cloth  6 each Topical Daily  . dronabinol  2.5 mg Oral BID AC  . DULoxetine  20 mg Oral Daily  . entecavir  0.5 mg Oral Daily  . feeding supplement (KATE FARMS STANDARD 1.4)  325 mL Oral  BID BM  . heparin  5,000 Units Subcutaneous Q12H  .  HYDROmorphone (DILAUDID) injection  0.5 mg Intravenous Q4H  . lactulose  30 g Oral TID  . metoCLOPramide  5 mg Oral TID AC & HS  . multivitamin with minerals  1 tablet Oral Daily  . oxyCODONE  15 mg Oral Q8H  . polyethylene glycol  17 g Oral BID  . senna-docusate  2 tablet Oral BID   Continuous Infusions: . sodium chloride 50 mL/hr at 09/08/19 0624    LOS: 8 days   Domenic Polite, MD

## 2019-09-08 NOTE — TOC Initial Note (Signed)
Transition of Care [facility]) - Initial/Assessment Note    Patient Details  Name: Maurice Lowe MRN: [id number] Date of Birth: 1981/03/19  Transition of [facility]) CM/SW Contact:    Lynnell Catalan, RN Phone Number: 09/08/2019, 2:11 PM  Clinical Narrative:                 [facility] consult for home with hospice. This CM spoke with wife for choice for home hospice. Wife chooses [facility] ([facility]). Liaison from [facility] contacted for referral. Pam with [facility] contacted as pt needs to go home with IV dilaudid drip. TOC will continue to follow.  Expected Discharge Plan: Home w Hospice Care Barriers to Discharge: Continued Medical Work up   Patient Goals and CMS Choice     Choice offered to / list presented to : Spouse  Expected Discharge Plan and Services Expected Discharge Plan: Home w Hospice Care   Discharge Planning Services: CM Consult Post Acute Care Choice: Hospice Living arrangements for the past 2 months: Single Family Home                   DME Agency: [facility] Arranged: Disease Management [place] Agency: [facility] [place] Date [facility] Agency Contacted: 09/08/19 Time HH Agency Contacted: 87 Representative spoke with at [place]: Audrea Muscat  Prior Living Arrangements/Services Living arrangements for the past 2 months: [place] Lives with:: Spouse          Need for Family Participation in Patient Care: Yes (Comment) Care giver support system in place?: Yes (comment)      Activities of Daily Living Home Assistive Devices/Equipment: None ADL Screening (condition at time of admission) Patient's cognitive ability adequate to safely complete daily activities?: Yes Is the patient deaf or have difficulty hearing?: No Does the patient have difficulty seeing, even when wearing glasses/contacts?: No Does the patient have difficulty concentrating, remembering, or making decisions?: No Patient able to express  need for assistance with ADLs?: No Does the patient have difficulty dressing or bathing?: No Independently performs ADLs?: Yes (appropriate for developmental age) Does the patient have difficulty walking or climbing stairs?: No Weakness of Legs: None Weakness of Arms/Hands: None  Permission Sought/Granted         Permission granted to share info w AGENCY: [facility]        Admission diagnosis:  Hypercalcemia [E83.52] Patient Active Problem List   Diagnosis Date Noted  . Constipation   . Hypercalcemia 08/30/2019  . Hepatocellular carcinoma ([place]) 07/30/2019  . Goals of care, counseling/discussion 07/30/2019  . Personal history of non-Hodgkin lymphomas 08/04/2018  . RUQ abdominal pain 12/15/2016  . Chronic viral hepatitis B without delta agent and without coma ([place]) 12/15/2016  . Liver lesion 12/15/2016  . Elevated LFTs   . Abdominal pain, epigastric    PCP:  Patient, No Pcp Per Pharmacy:   [id number] [place], [place] [id number] [place] [id number] Phone: [phone] Fax: [phone]  [facility] [place], [place] [address] [place] [address] Phone: [phone] Fax: [phone]  [place], [place] [address] Phone: [phone] Fax: [phone]  [place], [place] [address] Phone: [phone] Fax:  [phone]     Social Determinants of Health (SDOH) Interventions    Readmission Risk Interventions No flowsheet data found.

## 2019-09-08 NOTE — Progress Notes (Signed)
Daily Progress Note   Patient Name: Maurice Lowe       Date: 09/08/2019 DOB: May 14, 1981  Age: 39 y.o. MRN#: [id number] Attending Physician: Domenic Polite, MD Primary Care Physician: Patient, No Pcp Per Admit Date: 08/30/2019  Reason for Consultation/Follow-up: Establishing goals of care And pain management.   Subjective: Awake, reasonably alert, sitting up in bed, states that he still has uncontrolled abdominal pain this morning, took a few sips of coffee and water. Remains on opioids IV Dilaudid scheduled and PRN. To continue the same. Discussed with mother and wife, discussed with [facility] MD and med onc colleagues as well.    Length of Stay: 8  Current Medications: Scheduled Meds:  . Chlorhexidine Gluconate Cloth  6 each Topical Daily  . dronabinol  2.5 mg Oral BID AC  . DULoxetine  20 mg Oral Daily  . entecavir  0.5 mg Oral Daily  . feeding supplement (KATE FARMS STANDARD 1.4)  325 mL Oral BID BM  . heparin  5,000 Units Subcutaneous Q12H  .  HYDROmorphone (DILAUDID) injection  0.5 mg Intravenous Q4H  . lactulose  30 g Oral TID  . metoCLOPramide  5 mg Oral TID AC & HS  . multivitamin with minerals  1 tablet Oral Daily  . oxyCODONE  15 mg Oral Q8H  . polyethylene glycol  17 g Oral BID  . senna-docusate  2 tablet Oral BID    Continuous Infusions: . sodium chloride 50 mL/hr at 09/08/19 0624    PRN Meds: acetaminophen **OR** acetaminophen, alum & mag hydroxide-simeth, bisacodyl, HYDROmorphone (DILAUDID) injection, lidocaine-prilocaine, LORazepam, ondansetron (ZOFRAN) IV, ondansetron, oxyCODONE, prochlorperazine  Physical Exam         Appears with generalized weakness Regular work of breathing S1 S2 Has generalized edema Abdomen distended Muscle wasting cancer cachexia  evident Awake   Vital Signs: BP 112/78 (BP Location: Left Arm)   Pulse (!) 117   Temp 98.5 F (36.9 C) (Oral)   Resp 20   Ht 5\' 6"  (1.676 m)   Wt 54.9 kg   SpO2 98%   BMI 19.53 kg/m  SpO2: SpO2: 98 % O2 Device: O2 Device: Room Air O2 Flow Rate: O2 Flow Rate (L/min): 2 L/min  Intake/output summary:   Intake/Output Summary (Last 24 hours) at 09/08/2019 1033 Last data filed at 09/08/2019 0131 Gross  per 24 hour  Intake 1291.2 ml  Output 500 ml  Net 791.2 ml   LBM: Last BM Date: 09/07/19 Baseline Weight: Weight: 54.9 kg Most recent weight: Weight: 54.9 kg       Palliative Assessment/Data:      Patient Active Problem List   Diagnosis Date Noted  . Constipation   . Hypercalcemia 08/30/2019  . Hepatocellular carcinoma ([place]) 07/30/2019  . Goals of care, counseling/discussion 07/30/2019  . Personal history of non-Hodgkin lymphomas 08/04/2018  . RUQ abdominal pain 12/15/2016  . Chronic viral hepatitis B without delta agent and without coma ([place]) 12/15/2016  . Liver lesion 12/15/2016  . Elevated LFTs   . Abdominal pain, epigastric     Palliative Care Assessment & Plan   Patient Profile:  39 year old gentleman with life limiting illness of chronic hepatocellular carcinoma, liver cirrhosis, chronic hepatitis B with symptom burden of worsening abdominal pain and nausea.  Patient is being followed by Dr. Burr Medico from medical oncology.  He was on palliative immunotherapy.  Repeat imaging has shown extensive tumor burden within the liver progressive compared to imaging in March, new upper abdominal metastatic adenopathy also noted.  Patient requiring lactulose for his mental status.  Hospital course also complicated by hypercalcemia, was given hydration was also given Zometa.  Also on a bowel regimen.  Also has history of T-cell non-Hodgkin's lymphoma status post chemotherapy.  From a medical oncology perspective, it is recommended for the patient to consider comfort  care/hospice.  He is noted to have cancer progression with markedly limited prognosis.  A palliative consult has been requested for additional discussions, aggressive symptom management, disposition options discussions and addressing goals of care.  Assessment:  abdominal pain Nausea Generalized weakness Functional decline Cancer cachexia.  Likely approaching end of life.   Recommendations/Plan:   continue current pain and non pain symptom management.      Ongoing discussions about code status again with family. Family states that they are aware of how seriously and irreversibly ill the patient is. However, they remain hopeful for healing and miracle. Remains full code for now.  Anticipated hospital death. Patient continues to decline very rapidly    Code Status:    Code Status Orders  (From admission, onward)         Start     Ordered   08/30/19 1226  Full code  Continuous     08/30/19 1226        Code Status History    Date Active Date Inactive Code Status Order ID Comments User Context   12/09/2015 2351 12/12/2015 1713 Full Code [id number]  Vianne Bulls, MD Inpatient   Advance Care Planning Activity       Prognosis:   hours to days.   Discharge Planning:  I worry about patient not surviving this hospitalization, family very distraught whenever code status discussions are brought up.   Care plan was discussed with patient and family.   Thank you for allowing the Palliative Medicine Team to assist in the care of this patient.   Time In:  10 Time Out: 10.25 Total Time 25 Prolonged Time Billed  no       Greater than 50%  of this time was spent counseling and coordinating care related to the above assessment and plan.  Loistine Chance, MD  Please contact Palliative Medicine Team phone at [phone] for questions and concerns.

## 2019-09-08 NOTE — Progress Notes (Addendum)
Maurice Lowe   DOB:1980/08/17   [id number]   [id number]  Oncology follow up   Subjective: Pain is better controlled this morning.  He is slightly more alert but does not really talk to me.  He does smile intermittently.  Wife and mother at the bedside state that ate better this morning. No vomiting. Had a big bowel movement yesterday.   Objective:  Vitals:   09/07/19 2142 09/08/19 0530  BP: 133/87 125/84  Pulse: (!) 123 (!) 119  Resp: 16 16  Temp: 99.1 F (37.3 C) 98.1 F (36.7 C)  SpO2: 97% 97%    Body mass index is 19.53 kg/m.  Intake/Output Summary (Last 24 hours) at 09/08/2019 0807 Last data filed at 09/08/2019 0131 Gross per 24 hour  Intake 1291.2 ml  Output 500 ml  Net 791.2 ml     Sclerae unicteric  Oropharynx clear  No peripheral adenopathy  Lungs clear -- no rales or rhonchi  Heart regular rate and rhythm  Abdomen soft, (+) tenderness in the right upper quadrant  MSK no focal spinal tenderness, no peripheral edema  Neuro nonfocal   CBG (last 3)  No results for input(s): GLUCAP in the last 72 hours.   Labs:  Urine Studies No results for input(s): UHGB, CRYS in the last 72 hours.  Invalid input(s): UACOL, UAPR, USPG, UPH, UTP, UGL, UKET, UBIL, UNIT, UROB, ULEU, UEPI, UWBC, URBC, UBAC, CAST, [place], [place]  Basic Metabolic Panel: Recent Labs  Lab 09/02/19 0533 09/02/19 0533 09/03/19 0500 09/03/19 0500 09/04/19 0522 09/04/19 0522 09/05/19 0829 09/05/19 0829 09/06/19 0835 09/08/19 0615  NA 128*   < > 128*  --  131*  --  131*  --  128* 130*  K 3.9   < > 4.0   < > 4.7   < > 4.9   < > 4.7 4.1  CL 98   < > 101  --  101  --  102  --  102 102  CO2 22   < > 21*  --  22  --  21*  --  21* 21*  GLUCOSE 89   < > 99  --  101*  --  91  --  92 94  BUN 10   < > 8  --  10  --  13  --  13 15  CREATININE 0.50*   < > 0.42*  --  0.53*  --  0.57*  --  0.52* 0.51*  CALCIUM 10.1   < > 10.1  --  11.2*  --  11.8*  --  11.0* 11.0*  MG 2.0  --  1.9  --   --   --    --   --   --   --   PHOS 1.3*  --  1.3*  --   --   --   --   --   --   --    < > = values in this interval not displayed.   GFR Estimated Creatinine Clearance: 96.3 mL/min (A) (by C-G formula based on SCr of 0.51 mg/dL (L)). Liver Function Tests: Recent Labs  Lab 09/03/19 0500 09/04/19 0522 09/05/19 0829 09/06/19 0835 09/08/19 0615  AST 119* 128* 168* 212* 234*  ALT 31 36 40 43 46*  ALKPHOS 132* 141* 134* 145* 137*  BILITOT 0.7 0.9 0.8 1.1 1.1  PROT 7.7 8.7* 9.4* 8.7* 8.2*  ALBUMIN 2.1* 2.3* 2.6* 2.4* 2.1*   No results for input(s): LIPASE, AMYLASE in  the last 168 hours. Recent Labs  Lab 09/03/19 1122 09/06/19 1257  AMMONIA 60* 57*   Coagulation profile No results for input(s): INR, PROTIME in the last 168 hours.  CBC: Recent Labs  Lab 09/02/19 0533 09/03/19 0500 09/04/19 0522 09/05/19 0829 09/08/19 0615  WBC 16.3* 12.4* 11.2* 12.8* 19.6*  NEUTROABS 12.8* 9.4*  --   --   --   HGB 11.0* 11.0* 11.7* 12.5* 10.7*  HCT 34.6* 34.7* 36.6* 40.2 33.9*  MCV 84.8 85.3 85.7 87.2 87.8  PLT 270 286 315 299 215   Cardiac Enzymes: No results for input(s): CKTOTAL, CKMB, CKMBINDEX, TROPONINI in the last 168 hours. BNP: Invalid input(s): POCBNP CBG: No results for input(s): GLUCAP in the last 168 hours. D-Dimer No results for input(s): DDIMER in the last 72 hours. Hgb A1c No results for input(s): HGBA1C in the last 72 hours. Lipid Profile No results for input(s): CHOL, HDL, LDLCALC, TRIG, CHOLHDL, LDLDIRECT in the last 72 hours. Thyroid function studies No results for input(s): TSH, T4TOTAL, T3FREE, THYROIDAB in the last 72 hours.  Invalid input(s): FREET3 Anemia work up No results for input(s): VITAMINB12, FOLATE, FERRITIN, TIBC, IRON, RETICCTPCT in the last 72 hours. Microbiology Recent Results (from the past 240 hour(s))  SARS CORONAVIRUS 2 (TAT 6-24 HRS) Nasopharyngeal Nasopharyngeal Swab     Status: None   Collection Time: 08/30/19  5:02 PM   Specimen:  Nasopharyngeal Swab  Result Value Ref Range Status   SARS Coronavirus 2 NEGATIVE NEGATIVE Final    Comment: (NOTE) SARS-CoV-2 target nucleic acids are NOT DETECTED. The SARS-CoV-2 RNA is generally detectable in upper and lower respiratory specimens during the acute phase of infection. Negative results do not preclude SARS-CoV-2 infection, do not rule out co-infections with other pathogens, and should not be used as the sole basis for treatment or other patient management decisions. Negative results must be combined with clinical observations, patient history, and epidemiological information. The expected result is Negative. Fact Sheet for Patients: SugarRoll.be Fact Sheet for Healthcare Providers: https://www.woods-mathews.com/ This test is not yet approved or cleared by the [place] FDA and  has been authorized for detection and/or diagnosis of SARS-CoV-2 by FDA under an Emergency Use Authorization (EUA). This EUA will remain  in effect (meaning this test can be used) for the duration of the COVID-19 declaration under Section 56 4(b)(1) of the Act, 21 U.S.C. section 360bbb-3(b)(1), unless the authorization is terminated or revoked sooner. Performed at [facility], [place] [address], [place], [place] [address]   Culture, Urine     Status: Abnormal   Collection Time: 08/31/19  4:37 PM   Specimen: Urine, Random  Result Value Ref Range Status   Specimen Description   Final    URINE, RANDOM Performed at [facility] [address] [place], [place] [address]    Special Requests   Final    NONE Performed at [facility], [place] [address] [place], [place] [address]    Culture (A)  Final    <10,000 COLONIES/mL INSIGNIFICANT GROWTH Performed at [facility] [address], [place], [place] [address]    Report Status 09/02/2019 FINAL  Final  Culture, blood (routine x 2)     Status: None    Collection Time: 08/31/19  5:23 PM   Specimen: BLOOD  Result Value Ref Range Status   Specimen Description   Final    BLOOD RIGHT ARM Performed at [facility]  [place], [place] [address]    Special Requests   Final    BOTTLES DRAWN AEROBIC AND ANAEROBIC Blood Culture adequate volume Performed at [facility] [address] [place], [place] [address]    Culture   Final    NO GROWTH 5 DAYS Performed at [facility], [place] [address], [place], [place] [address]    Report Status 09/05/2019 FINAL  Final  Culture, blood (routine x 2)     Status: None   Collection Time: 08/31/19  5:23 PM   Specimen: BLOOD  Result Value Ref Range Status   Specimen Description   Final    BLOOD RIGHT HAND Performed at [facility] [address] [place], [place] [address]    Special Requests   Final    BOTTLES DRAWN AEROBIC AND ANAEROBIC Blood Culture adequate volume Performed at [facility] [address] [place], [place] [address]    Culture   Final    NO GROWTH 5 DAYS Performed at [facility], [place] [address] [place], [place] [address]    Report Status 09/05/2019 FINAL  Final      Studies:  No results found.  Assessment: 39 y.o.  1.  Metastatic hepatocellular carcinoma 2.  Abdominal pain secondary to [facility] and constipation 3.  Constipation 4.  Hypercalcemia of malignancy 5.  Leukocytosis 6.  Mild anemia 7.  Anorexia and weight loss 8.  Cirrhosis 7.  chronic Hepatitis B infection  8. Hyponatremia 9.  Metabolic encephalopathy  Plan:  -The patient's condition has overall declined which is likely related to his cancer progression.  At this point, he is not a candidate for any additional chemotherapy.  Appreciate assistance from the palliative care team and hospitalist with ongoing goals of care discussion.  He remains a full code. -Continue lactulose for constipation  encephalopathy. -Continue current pain medication.  Mikey Bussing, NP 09/08/2019  8:07 AM  Addendum  I have seen the patient, examined him. I agree with the assessment and and plan and have edited the notes.   Pt is lethargic, arousable, able to answer some simple questions.  Mother at the bedside.  I spoke with his wife on the phone also. Both wife and his mother agreed with DNR/DNI, and they would like to bring him home with home hospice.  Patient does not want to go to a facility. Appreciate the excellent care from the hospitalist and palliative care team. I communicated with them.  Truitt Merle  09/08/2019

## 2019-09-09 DIAGNOSIS — Z515 Encounter for palliative care: Secondary | ICD-10-CM

## 2019-09-09 DIAGNOSIS — C220 Liver cell carcinoma: Secondary | ICD-10-CM | POA: Diagnosis not present

## 2019-09-09 DIAGNOSIS — Z7189 Other specified counseling: Secondary | ICD-10-CM | POA: Diagnosis not present

## 2019-09-09 DIAGNOSIS — G893 Neoplasm related pain (acute) (chronic): Secondary | ICD-10-CM | POA: Diagnosis not present

## 2019-09-09 MED ORDER — HYDROMORPHONE 1 MG/ML IV SOLN: INTRAVENOUS | 0 refills | Status: AC

## 2019-09-09 NOTE — Progress Notes (Signed)
Hydrologist [facility]) Hospital Liaison: RN note   Notified by Transition of Care Manger of patient/family request for [facility] services at home after discharge. Chart and patient information reviewed by [facility] physician. Hospice eligibility confirmed.    Writer spoke wife, Manuela Schwartz with to initiate education related to hospice philosophy, services and team approach to care. Manuela Schwartz verbalized understanding of information given. Please send signed and completed DNR form home with patient/family. Patient will need prescriptions for discharge comfort medications.   DME needs have been discussed, patient currently has the following equipment in the home:none. Patient/family requests the following DME for delivery to the home: hospital bed, walker, W/C and 3N1. [facility] equipment manager has been notified and will contact DME provider to arrange delivery to the home. Home address has been verified and is correct in the chart. Manuela Schwartz is the family member to contact to arrange time of delivery.   [facility] aware of the above. Please notify [facility] when patient is ready to leave the unit at discharge. (Call [phone] or [phone] after 5pm.) [facility] information and contact numbers given to [place].   Please call with any hospice related questions.   Thank you for this referral.  Farrel Gordon, RN, [facility] (listed on [place] under [place])  [phone]

## 2019-09-09 NOTE — Progress Notes (Signed)
OT Cancellation Note  Patient Details Name: Maurice Lowe MRN: [id number] DOB: 19-Jun-1980   Cancelled Treatment:    Reason Eval/Treat Not Completed: Other (comment). Pt discharging home with hospice. Will defer further care to hospice. OT signing off.   Ramond Dial, OT/L   Acute OT Clinical Specialist Acute Rehabilitation Services Pager [phone] Office [phone]  09/09/2019, 8:59 AM

## 2019-09-09 NOTE — Progress Notes (Signed)
Daily Progress Note   Patient Name: Maurice Lowe       Date: 09/09/2019 DOB: 03-04-1981  Age: 39 y.o. MRN#: [id number] Attending Physician: Domenic Polite, MD Primary Care Physician: Patient, No Pcp Per Admit Date: 08/30/2019  Reason for Consultation/Follow-up: Establishing goals of care, Pain control and Psychosocial/spiritual support  Subjective: Chart reviewed including review of MAR with pain medication needs over the last 24 hours.  Maurice Lowe has been receiving dilaudid 0.32m per hour with additional breakthrough dilaudid as needed.  Has only required one additional bolus overnight.  I met today with Maurice Lowe  He is awake and alert, but slow to process information and slow to answer questions.  He currently reports that his pain is 7/10.  Goal would be to get to 4/10.  Reviewed plan to discharge home with hospice support and that I agree with recommendation for discharge home with PCA for pain control.    I also called and discussed plan and PCA usage with his wife, Maurice Lowe  She is in agreement with plan for transition home with hospice support.  She reports understanding medical teams recommendations and concern that he has limited prognosis.  At the same time, she is hopeful that once he is home that he will feel better and be able to eat more.        Length of Stay: 9  Current Medications: Scheduled Meds:  . Chlorhexidine Gluconate Cloth  6 each Topical Daily  . dronabinol  2.5 mg Oral BID AC  . DULoxetine  20 mg Oral Daily  . entecavir  0.5 mg Oral Daily  . feeding supplement (KATE [employer]ARMS STANDARD 1.4)  325 mL Oral BID BM  . heparin  5,000 Units Subcutaneous Q12H  . lactulose  30 g Oral TID  . metoCLOPramide  5 mg Oral TID AC & HS  . multivitamin with minerals  1 tablet  Oral Daily  . polyethylene glycol  17 g Oral BID  . senna-docusate  2 tablet Oral BID    Continuous Infusions: . sodium chloride 50 mL/hr at 09/08/19 1859  . HYDROmorphone 0.5 mg/hr (09/08/19 1859)    PRN Meds: acetaminophen **OR** acetaminophen, alum & mag hydroxide-simeth, bisacodyl, HYDROmorphone **AND** HYDROmorphone, lidocaine-prilocaine, LORazepam, ondansetron (ZOFRAN) IV, ondansetron, prochlorperazine  Physical Exam  General: Alert, awake, in no acute distress. Slow to answer questions.  Frail appearing. HEENT: No bruits, no goiter, no JVD Heart: Tachycardic. No murmur appreciated. Lungs: Decreased air movement, clear Abdomen: Soft, nontender, nondistended, positive bowel sounds.  Ext: No significant edema Skin: Warm and dry  Vital Signs: BP 121/84 (BP Location: Left Arm)   Pulse (!) 108   Temp 98.5 F (36.9 C) (Oral)   Resp 18   Ht 5' 6"  (1.676 m)   Wt 54.9 kg   SpO2 97%   BMI 19.53 kg/m  SpO2: SpO2: 97 % O2 Device: O2 Device: Room Air O2 Flow Rate: O2 Flow Rate (L/min): 2 L/min  Intake/output summary:   Intake/Output Summary (Last 24 hours) at 09/09/2019 1113 Last data filed at 09/09/2019 0525 Gross per 24 hour  Intake 849.27 ml  Output 200 ml  Net 649.27 ml   LBM: Last BM Date: 09/08/19 Baseline Weight: Weight: 54.9 kg Most recent weight: Weight: 54.9 kg       Palliative Assessment/Data:      Patient Active Problem List   Diagnosis Date Noted  . Constipation   . Hypercalcemia 08/30/2019  . Hepatocellular carcinoma ([place]) 07/30/2019  . Goals of care, counseling/discussion 07/30/2019  . Personal history of non-Hodgkin lymphomas 08/04/2018  . RUQ abdominal pain 12/15/2016  . Chronic viral hepatitis B without delta agent and without coma ([place]) 12/15/2016  . Liver lesion 12/15/2016  . Elevated LFTs   . Abdominal pain, epigastric     Palliative Care Assessment & Plan   Patient Profile: 39 yo male with metastatic hepatocellular  carcinoma.  Plan for transition home with hospice support.  Recommendations/Plan:  Discussed pain management with patient and his wife.  Coordinated care with Carolynn Sayers from infusion pharmacy regarding my recommendations for PCA on discharge.  Durable DNR completed and on chart.  Recommend hydromorphone PCA on discharge:   Basal rate 0.5 mg/hr.   Bolus dose: 0.5 mg  Lockout: 20 minutes Script printed and placed on chart.  Goals of Care and Additional Recommendations:  Limitations on Scope of Treatment: Avoid Hospitalization  Code Status:    Code Status Orders  (From admission, onward)         Start     Ordered   09/08/19 1338  Do not attempt resuscitation (DNR)  Continuous    Question Answer Comment  In the event of cardiac or respiratory ARREST Do not call a "code blue"   In the event of cardiac or respiratory ARREST Do not perform Intubation, CPR, defibrillation or ACLS   In the event of cardiac or respiratory ARREST Use medication by any route, position, wound care, and other measures to relive pain and suffering. May use oxygen, suction and manual treatment of airway obstruction as needed for comfort.      09/08/19 1337        Code Status History    Date Active Date Inactive Code Status Order ID Comments User Context   08/30/2019 1226 09/08/2019 1337 Full Code [id number]  Truddie Hidden, MD Inpatient   12/09/2015 2351 12/12/2015 1713 Full Code [id number]  Vianne Bulls, MD Inpatient   Advance Care Planning Activity       Prognosis:   < 6 months  Discharge Planning:  Home with Hospice  Care plan was discussed with patient, wife, hospice liaison, infusion pharmacy, and primary attending.  Thank you for allowing the Palliative Medicine Team to assist in the care of this patient.   Time In: 1000 Time  Out: 1040 Total Time 40 Prolonged Time Billed No      Greater than 50%  of this time was spent counseling and coordinating care related to the above  assessment and plan.  Micheline Rough, MD  Please contact Palliative Medicine Team phone at [phone] for questions and concerns.

## 2019-09-09 NOTE — TOC Transition Note (Signed)
Transition of Care [facility]) - CM/SW Discharge Note   Patient Details  Name: Maurice Lowe MRN: [id number] Date of Birth: [birth year]  [facility] CM/SW Contact:  Lynnell Catalan, RN Phone Number: 09/09/2019, 4:04 PM   Clinical Narrative:    Pt to dc home with hospice today ([facility]). Pt to DC with Dilaudid PCA pump. Pt to DC via [facility]. It was confirmed with [facility] that they can transport pt home with the PCA pump. Yellow DNR completed for transport home. DME was delivered to the home already per pt wife.       Patient Goals and CMS Choice     Choice offered to / list presented to : Spouse  Discharge Placement                       Discharge Plan and Services   Discharge Planning Services: CM Consult Post Acute Care Choice: Hospice            DME Agency[facility] Arranged: Disease Management [place] Agency: Hospice and [place] Date [place]: 09/08/19 Time [place]: 1410 Representative spoke with at [place]: Audrea Muscat  Social Determinants of Health ([place]) Interventions     Readmission Risk Interventions No flowsheet data found.

## 2019-09-09 NOTE — Progress Notes (Signed)
Pt left via EMS in stable condition to home w/Hospice, wife left prior to Pt in anticipation of arrival.

## 2019-09-09 NOTE — Discharge Summary (Signed)
Physician Discharge Summary  Maurice Lowe ATF:[id number] DOB: 19-Oct-1980 DOA: 08/30/2019  PCP: Patient, No Pcp Per  Admit date: 08/30/2019 Discharge date: 09/09/2019  Time spent: 35 minutes  Recommendations for Outpatient Follow-up:  1. Home with hospice services for comfort focused end-of-life care   Discharge Diagnoses:  Progressive metastatic hepatocellular carcinoma Chronic hepatitis B Liver cirrhosis Hepatic encephalopathy Worsening abdominal pain nausea Severe protein calorie malnutrition Adult failure to thrive   Hypercalcemia   Constipation DNR   Discharge Condition: Guarded  Diet recommendation: Comfort feeds  Filed Weights   08/30/19 0813  Weight: 54.9 kg    History of present illness:  Maurice Lowe a 38 y.o.malewith medical history significant ofhepatocellular carcinoma, chronic hepatitis B, liver cirrhosis, history of non-Hodgkin lymphoma who presented with ongoing abdominal pain in setting malignancy and nausea/vomiting.  Hospital Course:  Progressive Metastatic hepatocellular carcinoma Chronic hepatitis B Liver cirrhosis Hepatic encephalopathy Worsening abdominal pain, nausea -Followed by oncology Dr. Burr Medico was on palliative immunotherapy -Admitted with worsening symptoms, pain and nausea -Treated with supportive care, increasing dose of narcotics, antiemetics, laxatives and lactulose etc. -Repeat CT abdomen 4/14 noted extensive tumor burden within the liver is progressive compared to imaging in March and new upper abdominal metastatic adenopathy noted -Unfortunately continued to decline, followed by oncology this admission, his prognosis is felt to be few weeks, hospice was recommended to patient's family for comfort focused care -Seen by palliative care team, now DNR, started on Dilaudid PCA -He will be discharged home with hospice services today with dilaudid PCA  -Hypercalcemia -Had initially resolved with hydration, now with  worsening,  -Given Zometa x1 on 4/15, and IVF -Slight improvement noted in calcium  Constipation -Secondary to narcotics -Continue Senokot, MiraLAX, lactulose dosing -Now resolved, continue lactulose  History of T-cell non-Hodgkin's lymphoma -Status post chemotherapy  Normocytic Anemia -Stable  Discharge Exam: Vitals:   09/08/19 2036 09/09/19 0637  BP: 113/87 121/84  Pulse: 88 (!) 108  Resp: 20 18  Temp: (!) 97.5 F (36.4 C) 98.5 F (36.9 C)  SpO2: 96% 97%    General: Chronically ill, cachectic male somnolent but easily arousable, minimally interactive Cardiovascular: S1-S2, regular rhythm, tachycardic Respiratory: Poor air movement bilaterally  Discharge Instructions   Discharge Instructions    Discharge instructions   Complete by: As directed    Comfort care with home Hospice     Allergies as of 09/09/2019      Reactions   Phenergan [promethazine] Other (See Comments)   Hyperactivity and agitation      Medication List    STOP taking these medications   dronabinol 2.5 MG capsule Commonly known as: MARINOL   DULoxetine 20 MG capsule Commonly known as: CYMBALTA   entecavir 0.5 MG tablet Commonly known as: BARACLUDE   levofloxacin 750 MG tablet Commonly known as: Levaquin   metoCLOPramide 5 MG tablet Commonly known as: Reglan   oxyCODONE 10 mg 12 hr tablet Commonly known as: OXYCONTIN   oxyCODONE 5 MG immediate release tablet Commonly known as: Oxy IR/ROXICODONE     TAKE these medications   HYDROmorphone 1 mg/mL injection Commonly known as: DILAUDID Hydromorphone PCA  Basal rate: 0.61m per hour Bolus dose: 0.526m Lockout: 20 minutes  1 hour dose limit: 25m24mer hour  Pharmacist to determine concentration and dispense volume.  Please dispense 500m79m IV hydromorphone which is 10 day supply.  Hospice patient- Terminal diagnosis: Metastatic hepatocellular carcinoma, cancer related pain   Further titration and refills per hospice  attending.  Lactulose 20 GM/30ML Soln Take 30 mLs (20 g total) by mouth every 4 (four) hours as needed (for severe constipation).   lidocaine-prilocaine cream Commonly known as: EMLA Apply 1 application topically as needed. What changed: reasons to take this   ondansetron 8 MG tablet Commonly known as: ZOFRAN Take 1 tablet (8 mg total) by mouth every 8 (eight) hours as needed for nausea or vomiting.   prochlorperazine 5 MG tablet Commonly known as: COMPAZINE Take 1 tablet (5 mg total) by mouth every 8 (eight) hours as needed for nausea or vomiting.      Allergies  Allergen Reactions  . Phenergan [Promethazine] Other (See Comments)    Hyperactivity and agitation      The results of significant diagnostics from this hospitalization (including imaging, microbiology, ancillary and laboratory) are listed below for reference.    Significant Diagnostic Studies: DG Chest 2 View  Result Date: 08/21/2019 CLINICAL DATA:  Hepatocellular carcinoma.  Chest pain. EXAM: CHEST - 2 VIEW COMPARISON:  02/05/2018 and PET-CT 08/08/2019 FINDINGS: Slightly prominent interstitial markings in the left hilum are chronic. Stable sclerosis involving the anterior right fifth rib based on previous chest radiograph and prior PET-CT. No significant airspace disease or lung consolidation. Heart and mediastinum are within normal limits. No large pleural effusions. IMPRESSION: No active cardiopulmonary disease. Electronically Signed   By: Markus Daft M.D.   On: 08/21/2019 10:14   CT HEAD WO CONTRAST  Result Date: 09/01/2019 CLINICAL DATA:  Mental status change, persistent or worsening. EXAM: CT HEAD WITHOUT CONTRAST TECHNIQUE: Contiguous axial images were obtained from the base of the skull through the vertex without intravenous contrast. COMPARISON:  No pertinent prior studies available for comparison. FINDINGS: Brain: There is no evidence of acute intracranial hemorrhage, intracranial mass, midline shift or  extra-axial fluid collection.No demarcated cortical infarction. Incidentally noted cavum septum pellucidum and cavum vergae. Vascular: No hyperdense vessel Skull: Normal. Negative for fracture or focal lesion. Sinuses/Orbits: Visualized orbits demonstrate no acute abnormality. No significant paranasal sinus disease or mastoid effusion at the imaged levels. IMPRESSION: No evidence of acute intracranial abnormality. Electronically Signed   By: Kellie Simmering DO   On: 09/01/2019 13:35   CT ABDOMEN PELVIS W CONTRAST  Result Date: 09/03/2019 CLINICAL DATA:  Chronic hepatitis B infection. Hepatoma. Right lower quadrant pain EXAM: CT ABDOMEN AND PELVIS WITH CONTRAST TECHNIQUE: Multidetector CT imaging of the abdomen and pelvis was performed using the standard protocol following bolus administration of intravenous contrast. CONTRAST:  147m OMNIPAQUE IOHEXOL 300 MG/ML  SOLN COMPARISON:  MRI abdomen 07/25/2019 FINDINGS: Lower chest: New atelectasis and airspace disease identified within the lung bases, right greater than left. Trace right pleural fluid. Right CP angle lymph node is enlarged measuring 1.2 cm, image 14/2. Previously this measured 1 cm. Hepatobiliary: Innumerable, progressive lesions are identified throughout the liver. Lesions appear more confluent and increased in size. Confluent mass involving the lateral segment of left lobe of liver measures 4.0 x 3.2 cm, image 34/2. Previously this measured 3.1 x 2.7 cm. Gallbladder unremarkable. No gallstones identified. No biliary ductal dilatation. Pancreas: Unremarkable. No pancreatic ductal dilatation or surrounding inflammatory changes. Spleen: Normal in size without focal abnormality. Adrenals/Urinary Tract: Normal adrenal glands. No kidney mass or hydronephrosis identified. Urinary bladder unremarkable. Stomach/Bowel: Stomach is nondistended. The small bowel loops are normal. No small bowel wall thickening, inflammation or distension. The appendix is  unremarkable. There is a large stool burden identified throughout the colon particularly within the right colon including the cecum. Findings  may reflect underlying constipation. No pathologic dilatation of the colon. Vascular/Lymphatic: Large low-density lymph node in the peripancreatic region measures 3.2 x 1.9 cm, image 34/2. Previously 2.5 x 0.8 cm. Aortocaval lymph node posterior to head of pancreas measures 2.0 x 1.0 cm, image 38/2. Previously 0.9 x 0.6 cm. Porta hepatic node measures 1.4 x 1.9 cm, image 31/2. Previously 1.4 x 1.2 cm left retroperitoneal node measures 0.9 cm, image 47/2. New from previous exam. Reproductive: Prostate is unremarkable. Other: There is a small volume of ascites within the abdomen and pelvis. New from previous study. Musculoskeletal: No acute or significant osseous findings. IMPRESSION: 1. Extensive tumor burden within the liver is progressive when compared with 07/25/2019. 2. New upper abdominal metastatic adenopathy. 3. Large stool burden identified throughout the colon particularly within the right colon including the cecum. Findings may reflect underlying constipation. 4. Trace right pleural fluid and small volume of ascites. New from previous exam 5. New atelectasis and airspace disease identified within the lung bases, right greater than left. Electronically Signed   By: Kerby Moors M.D.   On: 09/03/2019 15:56   US BIOPSY (LIVER)  Result Date: 08/11/2019 INDICATION: 39 year old with history of hepatitis-B and infiltrative lesion involving the left hepatic lobe. Tissue diagnosis is needed. Remote history of non-Hodgkin's lymphoma. EXAM: ULTRASOUND-GUIDED LIVER LESION BIOPSY MEDICATIONS: None. ANESTHESIA/SEDATION: Moderate (conscious) sedation was employed during this procedure. A total of Versed 2.0 mg and Fentanyl 100 mcg was administered intravenously. Moderate Sedation Time: 10 minutes. The patient's level of consciousness and vital signs were monitored continuously  by radiology nursing throughout the procedure under my direct supervision. FLUOROSCOPY TIME:  None COMPLICATIONS: None immediate. PROCEDURE: Informed written consent was obtained from the patient after a thorough discussion of the procedural risks, benefits and alternatives. All questions were addressed. Maximal Sterile Barrier Technique was utilized including caps, mask, sterile gowns, sterile gloves, sterile drape, hand hygiene and skin antiseptic. A timeout was performed prior to the initiation of the procedure. Liver was evaluated with ultrasound. Anterior abdomen was prepped with chlorhexidine and sterile field was created. Skin and soft tissues were anesthetized with 1% lidocaine. Using ultrasound guidance, 17 gauge coaxial needle was directed into the left hepatic lobe. Total of 3 core biopsies were obtained with an 18 gauge core device. Specimens placed in formalin. Gel-Foam slurry was injected through the 17 gauge needle as it was removed. Bandage placed over the puncture site. FINDINGS: Left hepatic lobe is diffusely heterogeneous and evidence for poorly defined hypoechoic lesions. Three core biopsies were obtained in this area of heterogeneity. No significant bleeding or hematoma formation following the core biopsies. IMPRESSION: Ultrasound-guided core biopsies of the left hepatic lobe and infiltrative lesion. Electronically Signed   By: Markus Daft M.D.   On: 08/11/2019 14:58   DG ABD ACUTE 2+V W 1V CHEST  Result Date: 08/30/2019 CLINICAL DATA:  Constipation, upper abdominal pain and nausea EXAM: DG ABDOMEN ACUTE W/ 1V CHEST COMPARISON:  CT abdomen pelvis from 2018, recent abdominal MRI of 2021 FINDINGS: Cardiomediastinal contours and hilar structures are normal. Lungs are clear. No free air beneath either right or left hemidiaphragm. Bony structures of the thorax are normal. Gas-filled colon with no signs of significant distension. Gas and stool in the area of the rectum. Mixture of gas and stool in  the ascending colon. No abnormal calcifications. Visualized skeletal structures are normal. IMPRESSION: Gas-filled colon with scattered stool. With no signs of obstruction or free air. No acute cardiopulmonary disease. Electronically Signed  By: Zetta Bills M.D.   On: 08/30/2019 09:54   IR IMAGING GUIDED PORT INSERTION  Result Date: 09/01/2019 INDICATION: History of hepatocellular carcinoma. In need of durable intravenous access for chemotherapy administration. EXAM: IMPLANTED PORT A CATH PLACEMENT WITH ULTRASOUND AND FLUOROSCOPIC GUIDANCE COMPARISON:  PET-CT-08/08/2019 MEDICATIONS: Ancef 2 gm IV; The antibiotic was administered within an appropriate time interval prior to skin puncture. ANESTHESIA/SEDATION: Moderate (conscious) sedation was employed during this procedure. A total of Versed 2 mg and Fentanyl 100 mcg was administered intravenously. Moderate Sedation Time: 22 minutes. The patient's level of consciousness and vital signs were monitored continuously by radiology nursing throughout the procedure under my direct supervision. CONTRAST:  None FLUOROSCOPY TIME:  18 seconds (3 mGy) COMPLICATIONS: None immediate. PROCEDURE: The procedure, risks, benefits, and alternatives were explained to the patient. Questions regarding the procedure were encouraged and answered. The patient understands and consents to the procedure. The right neck and chest were prepped with chlorhexidine in a sterile fashion, and a sterile drape was applied covering the operative field. Maximum barrier sterile technique with sterile gowns and gloves were used for the procedure. A timeout was performed prior to the initiation of the procedure. Local anesthesia was provided with 1% lidocaine with epinephrine. After creating a small venotomy incision, a micropuncture kit was utilized to access the internal jugular vein. Real-time ultrasound guidance was utilized for vascular access including the acquisition of a permanent ultrasound  image documenting patency of the accessed vessel. The microwire was utilized to measure appropriate catheter length. A subcutaneous port pocket was then created along the upper chest wall utilizing a combination of sharp and blunt dissection. The pocket was irrigated with sterile saline. A single lumen "Slim" sized power injectable port was chosen for placement. The 8 Fr catheter was tunneled from the port pocket site to the venotomy incision. The port was placed in the pocket. The external catheter was trimmed to appropriate length. At the venotomy, an 8 Fr peel-away sheath was placed over a guidewire under fluoroscopic guidance. The catheter was then placed through the sheath and the sheath was removed. Final catheter positioning was confirmed and documented with a fluoroscopic spot radiograph. The port was accessed with a Huber needle, aspirated and flushed with heparinized saline. The venotomy site was closed with an interrupted 4-0 Vicryl suture. The port pocket incision was closed with interrupted 2-0 Vicryl suture. Dermabond and Steri-strips were applied to both incisions. Dressings were applied. The patient tolerated the procedure well without immediate post procedural complication. FINDINGS: After catheter placement, the tip lies within the superior cavoatrial junction. The catheter aspirates and flushes normally and is ready for immediate use. IMPRESSION: Successful placement of a right internal jugular approach power injectable Port-A-Cath. The catheter is ready for immediate use. Electronically Signed   By: Sandi Mariscal M.D.   On: 09/01/2019 13:02    Microbiology: Recent Results (from the past 240 hour(s))  SARS CORONAVIRUS 2 (TAT 6-24 HRS) Nasopharyngeal Nasopharyngeal Swab     Status: None   Collection Time: 08/30/19  5:02 PM   Specimen: Nasopharyngeal Swab  Result Value Ref Range Status   SARS Coronavirus 2 NEGATIVE NEGATIVE Final    Comment: (NOTE) SARS-CoV-2 target nucleic acids are NOT  DETECTED. The SARS-CoV-2 RNA is generally detectable in upper and lower respiratory specimens during the acute phase of infection. Negative results do not preclude SARS-CoV-2 infection, do not rule out co-infections with other pathogens, and should not be used as the sole basis for treatment or other  patient management decisions. Negative results must be combined with clinical observations, patient history, and epidemiological information. The expected result is Negative. Fact Sheet for Patients: SugarRoll.be Fact Sheet for Healthcare Providers: https://www.woods-mathews.com/ This test is not yet approved or cleared by the [place] FDA and  has been authorized for detection and/or diagnosis of SARS-CoV-2 by FDA under an Emergency Use Authorization (EUA). This EUA will remain  in effect (meaning this test can be used) for the duration of the COVID-19 declaration under Section 56 4(b)(1) of the Act, 21 U.S.C. section 360bbb-3(b)(1), unless the authorization is terminated or revoked sooner. Performed at [facility], [place] [address], [place], [place] [address]   Culture, Urine     Status: Abnormal   Collection Time: 08/31/19  4:37 PM   Specimen: Urine, Random  Result Value Ref Range Status   Specimen Description   Final    URINE, RANDOM Performed at [facility] [address] [place], [place] [address]    Special Requests   Final    NONE Performed at [facility], [place] [address], [place], [place] [address]    Culture (A)  Final    <10,000 COLONIES/mL INSIGNIFICANT GROWTH Performed at [facility] [address] [place], [place] [address]    Report Status 09/02/2019 FINAL  Final  Culture, blood (routine x 2)     Status: None   Collection Time: 08/31/19  5:23 PM   Specimen: BLOOD  Result Value Ref Range Status   Specimen Description   Final    BLOOD RIGHT ARM Performed at [facility] [address] [place], [place] [address]    Special Requests   Final    BOTTLES DRAWN AEROBIC AND ANAEROBIC Blood Culture adequate volume Performed at [facility] [address] [place], [place] [address]    Culture   Final    NO GROWTH 5 DAYS Performed at [facility], [place] [address], [place], [place] [address]    Report Status 09/05/2019 FINAL  Final  Culture, blood (routine x 2)     Status: None   Collection Time: 08/31/19  5:23 PM   Specimen: BLOOD  Result Value Ref Range Status   Specimen Description   Final    BLOOD RIGHT HAND Performed at [facility] [address], [place], [place] [address]    Special Requests   Final    BOTTLES DRAWN AEROBIC AND ANAEROBIC Blood Culture adequate volume Performed at [facility] [address], [place], [place] [address]    Culture   Final    NO GROWTH 5 DAYS Performed at [facility], [place] [address], [place], [place] [address]    Report Status 09/05/2019 FINAL  Final     Labs: Basic Metabolic Panel: Recent Labs  Lab 09/03/19 0500 09/04/19 0522 09/05/19 0829 09/06/19 0835 09/08/19 0615  NA 128* 131* 131* 128* 130*  K 4.0 4.7 4.9 4.7 4.1  CL 101 101 102 102 102  CO2 21* 22 21* 21* 21*  GLUCOSE 99 101* 91 92 94  BUN _0 CREATININE 0.42* 0.53* 0.57* 0.52* 0.51*  CALCIUM 10.1 11.2* 11.8* 11.0* 11.0*  MG 1.9  --   --   --   --   PHOS 1.3*  --   --   --   --    Liver Function Tests: Recent Labs  Lab 09/03/19  0500 09/04/19 0522 09/05/19 0829 09/06/19 0835 09/08/19 0615  AST 119* 128* 168* 212* 234*  ALT 31 36 40 43 46*  ALKPHOS 132* 141* 134* 145* 137*  BILITOT 0.7 0.9 0.8 1.1 1.1  PROT 7.7 8.7* 9.4* 8.7* 8.2*  ALBUMIN 2.1* 2.3* 2.6* 2.4* 2.1*   No results for input(s): LIPASE, AMYLASE in the last 168 hours. Recent Labs  Lab 09/03/19 1122 09/06/19 1257  AMMONIA 60* 57*   CBC: Recent Labs  Lab  09/03/19 0500 09/04/19 0522 09/05/19 0829 09/08/19 0615  WBC 12.4* 11.2* 12.8* 19.6*  NEUTROABS 9.4*  --   --   --   HGB 11.0* 11.7* 12.5* 10.7*  HCT 34.7* 36.6* 40.2 33.9*  MCV 85.3 85.7 87.2 87.8  PLT 286 315 299 215   Cardiac Enzymes: No results for input(s): CKTOTAL, CKMB, CKMBINDEX, TROPONINI in the last 168 hours. BNP: BNP (last 3 results) No results for input(s): BNP in the last 8760 hours.  ProBNP (last 3 results) No results for input(s): PROBNP in the last 8760 hours.  CBG: No results for input(s): GLUCAP in the last 168 hours.  Signed: Domenic Polite MD.  [facility] 09/09/2019, 12:07 PM

## 2019-09-12 DIAGNOSIS — K7290 Hepatic failure, unspecified without coma: Secondary | ICD-10-CM | POA: Diagnosis not present

## 2019-09-12 DIAGNOSIS — G894 Chronic pain syndrome: Secondary | ICD-10-CM | POA: Diagnosis not present

## 2019-09-12 DIAGNOSIS — C220 Liver cell carcinoma: Secondary | ICD-10-CM | POA: Diagnosis not present

## 2019-09-12 DIAGNOSIS — E43 Unspecified severe protein-calorie malnutrition: Secondary | ICD-10-CM | POA: Diagnosis not present

## 2019-09-19 NOTE — Progress Notes (Signed)
Pharmacist Chemotherapy Monitoring - Follow Up Assessment    I verify that I have reviewed each item in the below checklist:  . Regimen for the patient is scheduled for the appropriate day and plan matches scheduled date. [name] Appropriate non-routine labs are ordered dependent on drug ordered. . If applicable, additional medications reviewed and ordered per protocol based on lifetime cumulative doses and/or treatment regimen.   Plan for follow-up and/or issues identified: Yes . I-vent associated with next due treatment: Yes . MD and/or nursing notified: No  Maurice Lowe [facility] 09/19/2019 12:52 PM

## 2019-09-20 DEATH — deceased

## 2019-09-25 ENCOUNTER — Inpatient Hospital Stay: Payer: BC Managed Care – PPO

## 2019-09-25 ENCOUNTER — Inpatient Hospital Stay: Payer: BC Managed Care – PPO | Admitting: Nutrition

## 2019-09-25 ENCOUNTER — Inpatient Hospital Stay: Payer: BC Managed Care – PPO | Admitting: Hematology

## 2019-10-10 ENCOUNTER — Encounter: Payer: Self-pay | Admitting: Gastroenterology

## 2020-06-19 IMAGING — CT CT ABD-PELV W/ CM
2 of 5 series · 14 of 46 positions shown, 16 images · IV contrast (APPLIED)
Comparison: MRI abdomen 07/25/2019

CLINICAL DATA: Chronic hepatitis B infection. Hepatoma. Right lower
quadrant pain

EXAM:
CT ABDOMEN AND PELVIS WITH CONTRAST
TECHNIQUE: Multidetector CT imaging of the abdomen and pelvis was performed
using the standard protocol following bolus administration of
intravenous contrast.
CONTRAST:  100mL OMNIPAQUE IOHEXOL 300 MG/ML  SOLN

[Series 2: axial st · axial · 0.60mm/px · z∈[-632,-207]mm · 11 of 99 slices shown, 13 images]
[im 7/99  soft-tissue]
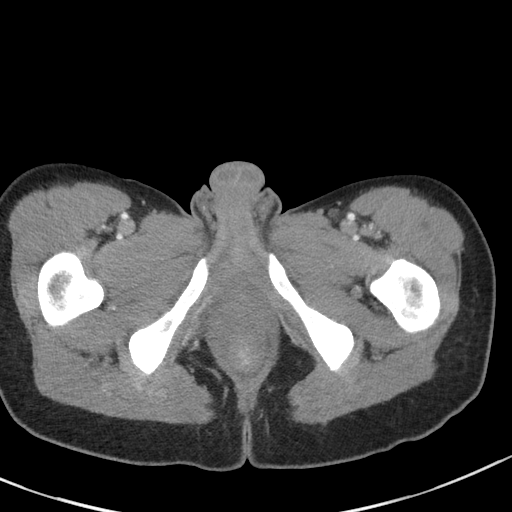
[im 7/99  bone]
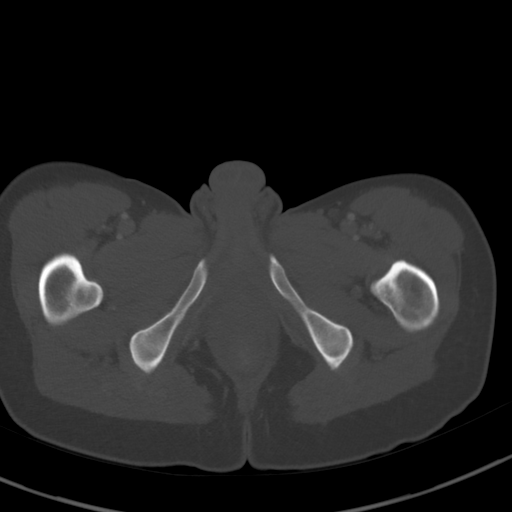
[im 14/99  soft-tissue]
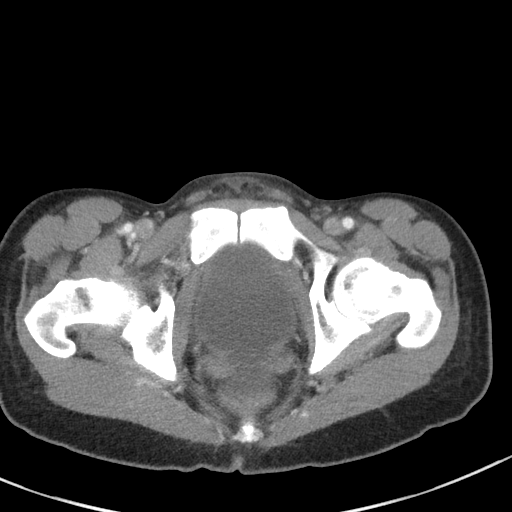
[im 27/99  soft-tissue]
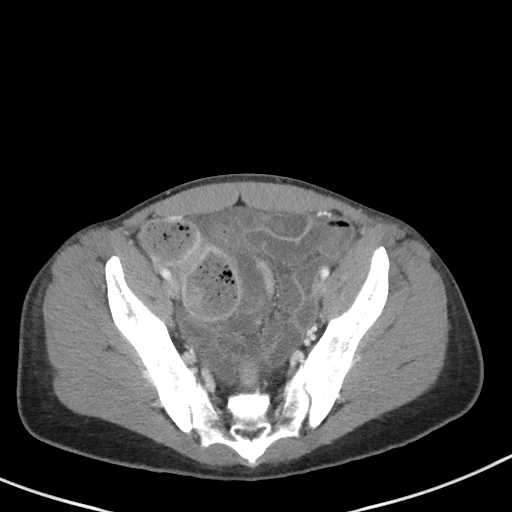
[im 33/99  soft-tissue]
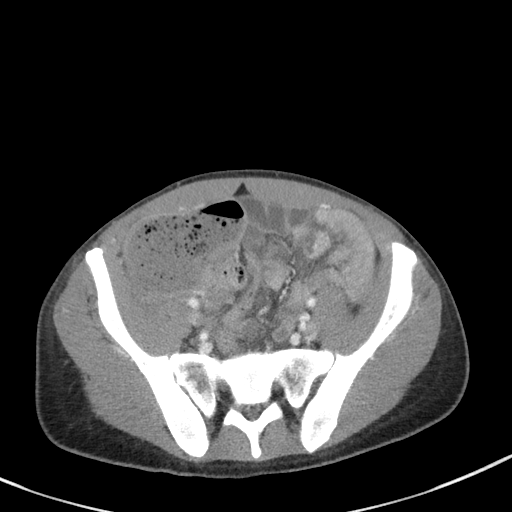
[im 40/99  soft-tissue]
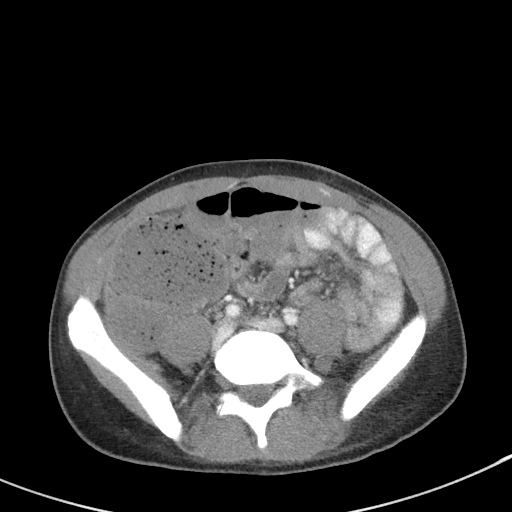
[im 53/99  soft-tissue]
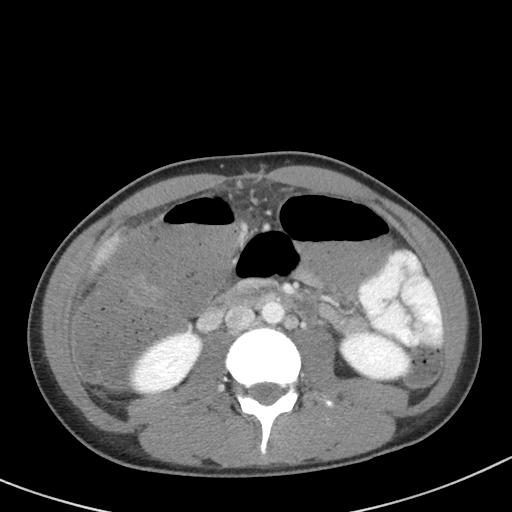
[im 59/99  soft-tissue]
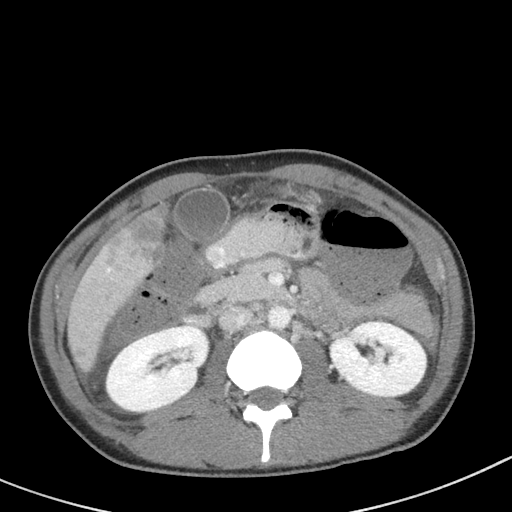
[im 66/99  soft-tissue]
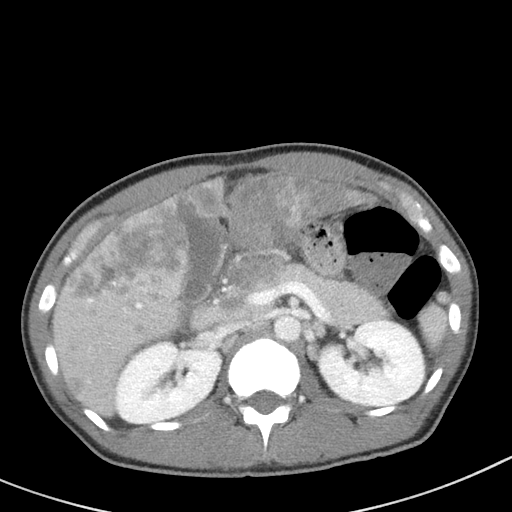
[im 72/99  soft-tissue]
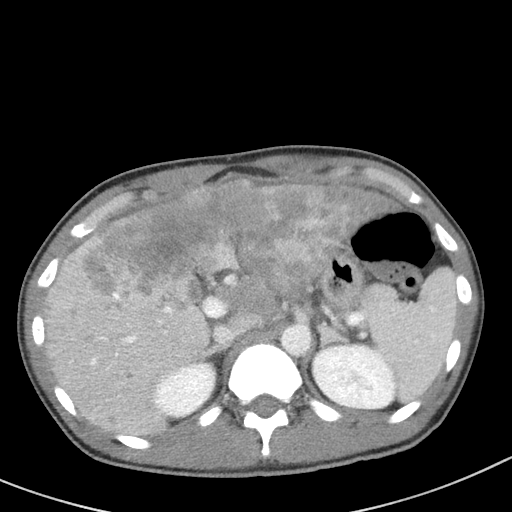
[im 72/99  bone]
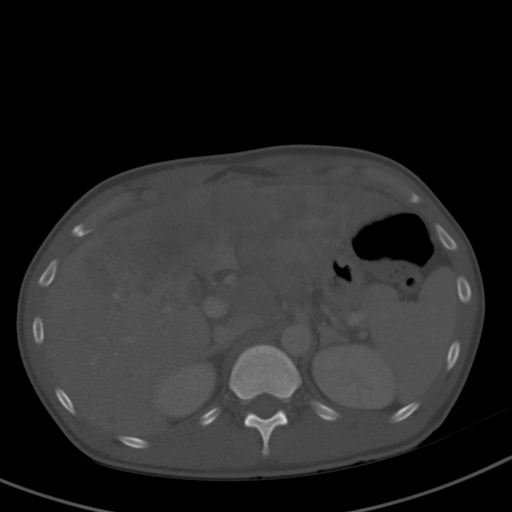
[im 85/99  soft-tissue]
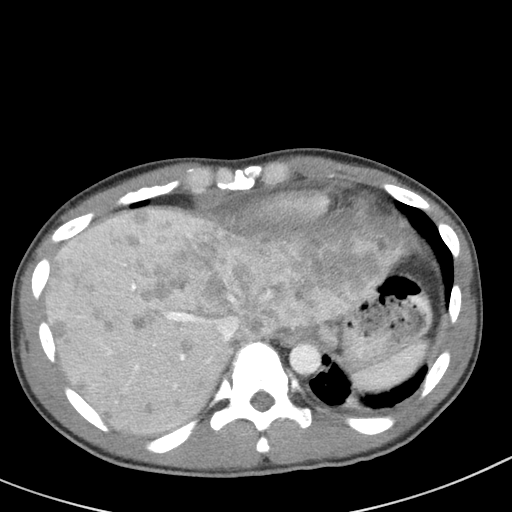
[im 92/99  soft-tissue]
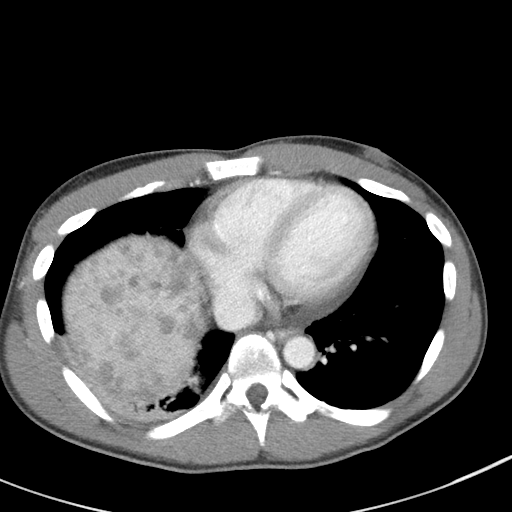

[Series 5: coronal st · coronal · 0.62mm/px · 3 of 68 slices shown]
[im 23/68  soft-tissue]
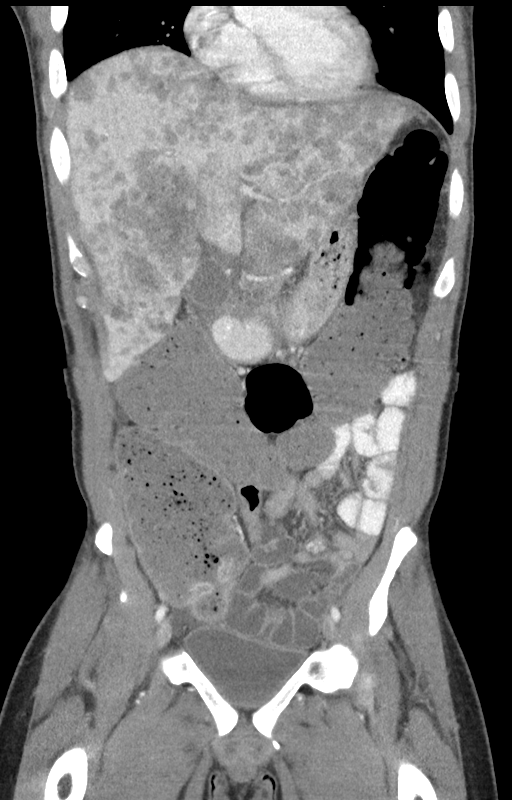
[im 30/68  soft-tissue]
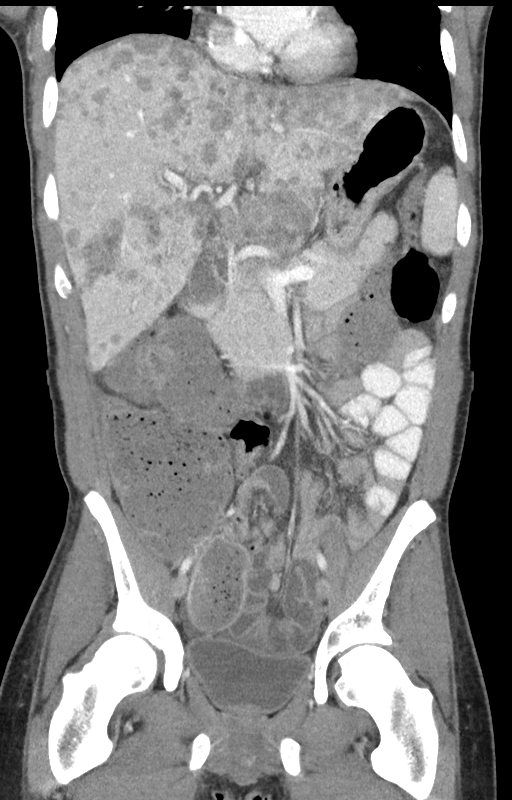
[im 38/68  soft-tissue]
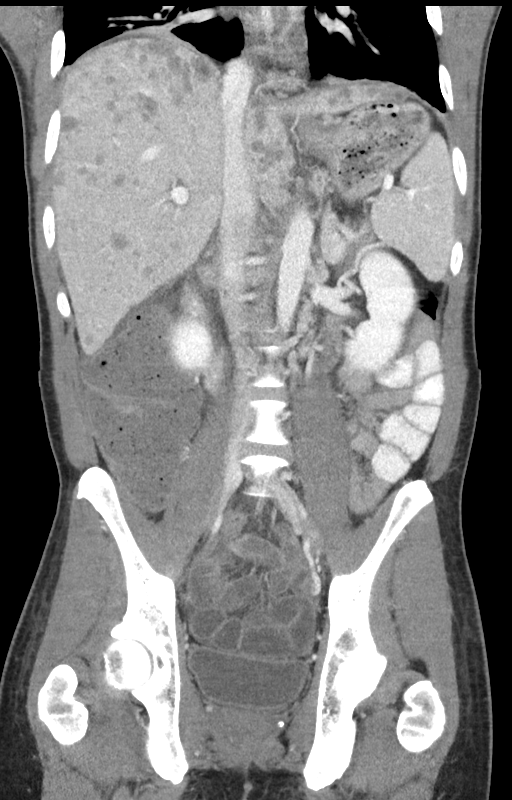

[14 of 46 positions shown; findings below may reference images not displayed]

FINDINGS: Lower chest: New atelectasis and airspace disease identified within
the lung bases, right greater than left. Trace right pleural fluid.
Right CP angle lymph node is enlarged measuring 1.2 cm, image [DATE].
Previously this measured 1 cm.

Hepatobiliary: Innumerable, progressive lesions are identified
throughout the liver. Lesions appear more confluent and increased in
size. Confluent mass involving the lateral segment of left lobe of
liver measures 4.0 x 3.2 cm, image 34/2. Previously this measured
3.1 x 2.7 cm. Gallbladder unremarkable. No gallstones identified. No
biliary ductal dilatation.

Pancreas: Unremarkable. No pancreatic ductal dilatation or
surrounding inflammatory changes.

Spleen: Normal in size without focal abnormality.

Adrenals/Urinary Tract: Normal adrenal glands. No kidney mass or
hydronephrosis identified. Urinary bladder unremarkable.

Stomach/Bowel: Stomach is nondistended. The small bowel loops are
normal. No small bowel wall thickening, inflammation or distension.
The appendix is unremarkable. There is a large stool burden
identified throughout the colon particularly within the right colon
including the cecum. Findings may reflect underlying constipation.
No pathologic dilatation of the colon.

Vascular/Lymphatic: Large low-density lymph node in the
peripancreatic region measures 3.2 x 1.9 cm, image 34/2. Previously
2.5 x 0.8 cm. Aortocaval lymph node posterior to head of pancreas
measures 2.0 x 1.0 cm, image 38/2. Previously 0.9 x 0.6 cm. Porta
hepatic node measures 1.4 x 1.9 cm, image [DATE]. Previously 1.4 x
cm left retroperitoneal node measures 0.9 cm, image 47/2. New from
previous exam.

Reproductive: Prostate is unremarkable.

Other: There is a small volume of ascites within the abdomen and
pelvis. New from previous study.

Musculoskeletal: No acute or significant osseous findings.
IMPRESSION: 1. Extensive tumor burden within the liver is progressive when
compared with 07/25/2019.
2. New upper abdominal metastatic adenopathy.
3. Large stool burden identified throughout the colon particularly
within the right colon including the cecum. Findings may reflect
underlying constipation.
4. Trace right pleural fluid and small volume of ascites. New from
previous exam
5. New atelectasis and airspace disease identified within the lung
bases, right greater than left.
